# Patient Record
Sex: Male | Born: 1985 | Race: White | Hispanic: No | Marital: Married | Smoking: Current every day smoker
Health system: Southern US, Community
[De-identification: ages and names within clinical notes are randomized; demographics above are authoritative.]

## PROBLEM LIST (undated history)

## (undated) DIAGNOSIS — K589 Irritable bowel syndrome without diarrhea: Secondary | ICD-10-CM

## (undated) DIAGNOSIS — F1010 Alcohol abuse, uncomplicated: Secondary | ICD-10-CM

## (undated) DIAGNOSIS — K859 Acute pancreatitis without necrosis or infection, unspecified: Secondary | ICD-10-CM

## (undated) DIAGNOSIS — F419 Anxiety disorder, unspecified: Secondary | ICD-10-CM

## (undated) DIAGNOSIS — F329 Major depressive disorder, single episode, unspecified: Secondary | ICD-10-CM

## (undated) DIAGNOSIS — F32A Depression, unspecified: Secondary | ICD-10-CM

## (undated) HISTORY — PX: CHOLECYSTECTOMY: SHX55

---

## 2018-07-16 ENCOUNTER — Emergency Department (HOSPITAL_COMMUNITY)
Admission: EM | Admit: 2018-07-16 | Discharge: 2018-07-16 | Disposition: A | Discharge status: Home / Self Care | Payer: Medicaid Other | Attending: Emergency Medicine | Admitting: Emergency Medicine

## 2018-07-16 ENCOUNTER — Encounter (HOSPITAL_COMMUNITY): Payer: Self-pay | Admitting: *Deleted

## 2018-07-16 ENCOUNTER — Emergency Department (HOSPITAL_COMMUNITY): Payer: Medicaid Other

## 2018-07-16 DIAGNOSIS — Y929 Unspecified place or not applicable: Secondary | ICD-10-CM | POA: Diagnosis not present

## 2018-07-16 DIAGNOSIS — S01412A Laceration without foreign body of left cheek and temporomandibular area, initial encounter: Secondary | ICD-10-CM | POA: Insufficient documentation

## 2018-07-16 DIAGNOSIS — Y9389 Activity, other specified: Secondary | ICD-10-CM | POA: Insufficient documentation

## 2018-07-16 DIAGNOSIS — R51 Headache: Secondary | ICD-10-CM | POA: Insufficient documentation

## 2018-07-16 DIAGNOSIS — F17210 Nicotine dependence, cigarettes, uncomplicated: Secondary | ICD-10-CM | POA: Diagnosis not present

## 2018-07-16 DIAGNOSIS — Z23 Encounter for immunization: Secondary | ICD-10-CM | POA: Diagnosis not present

## 2018-07-16 DIAGNOSIS — Y999 Unspecified external cause status: Secondary | ICD-10-CM | POA: Diagnosis not present

## 2018-07-16 DIAGNOSIS — M7918 Myalgia, other site: Secondary | ICD-10-CM

## 2018-07-16 DIAGNOSIS — S0181XA Laceration without foreign body of other part of head, initial encounter: Secondary | ICD-10-CM

## 2018-07-16 MED ORDER — OXYCODONE-ACETAMINOPHEN 5-325 MG PO TABS
1.0000 | ORAL_TABLET | Freq: Once | ORAL | Status: AC
  Administered 2018-07-16: 1 via ORAL
  Filled 2018-07-16: qty 1

## 2018-07-16 MED ORDER — TETANUS-DIPHTH-ACELL PERTUSSIS 5-2.5-18.5 LF-MCG/0.5 IM SUSP
0.5000 mL | Freq: Once | INTRAMUSCULAR | Status: AC
  Administered 2018-07-16: 0.5 mL via INTRAMUSCULAR
  Filled 2018-07-16: qty 0.5

## 2018-07-16 MED ORDER — METHOCARBAMOL 500 MG PO TABS: 500.0000 mg | ORAL_TABLET | Freq: Two times a day (BID) | ORAL | 0 refills | Status: DC

## 2018-07-16 MED ORDER — LIDOCAINE-EPINEPHRINE 2 %-1:100000 IJ SOLN: 20.0000 mL | Freq: Once | INTRAMUSCULAR | Status: DC

## 2018-07-16 MED ORDER — LIDOCAINE-EPINEPHRINE (PF) 2 %-1:200000 IJ SOLN
20.0000 mL | Freq: Once | INTRAMUSCULAR | Status: AC
  Administered 2018-07-16: 20 mL
  Filled 2018-07-16: qty 20

## 2018-07-16 MED ORDER — TRAMADOL HCL 50 MG PO TABS: 50.0000 mg | ORAL_TABLET | Freq: Four times a day (QID) | ORAL | 0 refills | Status: DC | PRN
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
## 2018-07-16 NOTE — ED Notes (Signed)
Patient verbalizes understanding of discharge instructions. Opportunity for questioning and answers were provided. Armband removed by staff, pt discharged from ED.  
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
## 2018-07-16 NOTE — Discharge Instructions (Addendum)
Please read attached information. If you experience any new or worsening signs or symptoms please return to the emergency room for evaluation. Please follow-up with your primary care provider or specialist as discussed. Please use medication prescribed only as directed and discontinue taking if you have any concerning signs or symptoms.   °

## 2018-09-08 ENCOUNTER — Emergency Department (HOSPITAL_COMMUNITY)
Admission: EM | Admit: 2018-09-08 | Discharge: 2018-09-08 | Disposition: A | Discharge status: Home / Self Care | Payer: Medicaid Other | Attending: Emergency Medicine | Admitting: Emergency Medicine

## 2018-09-08 ENCOUNTER — Encounter (HOSPITAL_COMMUNITY): Payer: Self-pay

## 2018-09-08 ENCOUNTER — Other Ambulatory Visit: Payer: Self-pay

## 2018-09-08 ENCOUNTER — Emergency Department (HOSPITAL_COMMUNITY): Payer: Medicaid Other

## 2018-09-08 DIAGNOSIS — Z79899 Other long term (current) drug therapy: Secondary | ICD-10-CM | POA: Diagnosis not present

## 2018-09-08 DIAGNOSIS — R1084 Generalized abdominal pain: Secondary | ICD-10-CM | POA: Diagnosis present

## 2018-09-08 DIAGNOSIS — F17200 Nicotine dependence, unspecified, uncomplicated: Secondary | ICD-10-CM | POA: Insufficient documentation

## 2018-09-08 DIAGNOSIS — K529 Noninfective gastroenteritis and colitis, unspecified: Secondary | ICD-10-CM

## 2018-09-08 HISTORY — DX: Irritable bowel syndrome, unspecified: K58.9

## 2018-09-08 HISTORY — DX: Major depressive disorder, single episode, unspecified: F32.9

## 2018-09-08 HISTORY — DX: Anxiety disorder, unspecified: F41.9

## 2018-09-08 HISTORY — DX: Depression, unspecified: F32.A

## 2018-09-08 LAB — COMPREHENSIVE METABOLIC PANEL
ALK PHOS: 129 U/L — AB (ref 38–126)
ALT: 29 U/L (ref 0–44)
AST: 38 U/L (ref 15–41)
Albumin: 3.9 g/dL (ref 3.5–5.0)
Anion gap: 13 (ref 5–15)
BILIRUBIN TOTAL: 0.8 mg/dL (ref 0.3–1.2)
CALCIUM: 9 mg/dL (ref 8.9–10.3)
CO2: 21 mmol/L — ABNORMAL LOW (ref 22–32)
CREATININE: 0.79 mg/dL (ref 0.61–1.24)
Chloride: 97 mmol/L — ABNORMAL LOW (ref 98–111)
GFR calc Af Amer: >60 mL/min (ref >60)
Glucose, Bld: 99 mg/dL (ref 70–99)
Potassium: 3.9 mmol/L (ref 3.5–5.1)
Sodium: 131 mmol/L — ABNORMAL LOW (ref 135–145)
TOTAL PROTEIN: 7.1 g/dL (ref 6.5–8.1)

## 2018-09-08 LAB — CBC WITH DIFFERENTIAL/PLATELET
ABS IMMATURE GRANULOCYTES: 0.02 10*3/uL (ref 0.00–0.07)
BASOS PCT: 1 %
Basophils Absolute: 0.1 10*3/uL (ref 0.0–0.1)
Eosinophils Absolute: 0.3 10*3/uL (ref 0.0–0.5)
Eosinophils Relative: 3 %
HEMATOCRIT: 48.2 % (ref 39.0–52.0)
Hemoglobin: 15.6 g/dL (ref 13.0–17.0)
Immature Granulocytes: 0 %
LYMPHS ABS: 1.6 10*3/uL (ref 0.7–4.0)
Lymphocytes Relative: 19 %
MCH: 30.3 pg (ref 26.0–34.0)
MCHC: 32.4 g/dL (ref 30.0–36.0)
MCV: 93.6 fL (ref 80.0–100.0)
MONO ABS: 0.5 10*3/uL (ref 0.1–1.0)
MONOS PCT: 6 %
Neutro Abs: 5.9 10*3/uL (ref 1.7–7.7)
Neutrophils Relative %: 71 %
PLATELETS: 214 10*3/uL (ref 150–400)
RBC: 5.15 MIL/uL (ref 4.22–5.81)
RDW: 12.2 % (ref 11.5–15.5)
WBC: 8.3 10*3/uL (ref 4.0–10.5)
nRBC: 0 % (ref 0.0–0.2)

## 2018-09-08 LAB — URINALYSIS, ROUTINE W REFLEX MICROSCOPIC
Bacteria, UA: NONE SEEN
Bilirubin Urine: NEGATIVE
GLUCOSE, UA: NEGATIVE mg/dL
Ketones, ur: NEGATIVE mg/dL
Leukocytes, UA: NEGATIVE
Nitrite: NEGATIVE
PH: 5 (ref 5.0–8.0)
Protein, ur: NEGATIVE mg/dL
Specific Gravity, Urine: 1.008 (ref 1.005–1.030)

## 2018-09-08 LAB — LIPASE, BLOOD: LIPASE: 44 U/L (ref 11–51)

## 2018-09-08 MED ORDER — ONDANSETRON 4 MG PO TBDP: 4.0000 mg | ORAL_TABLET | Freq: Three times a day (TID) | ORAL | 0 refills | Status: DC | PRN

## 2018-09-08 MED ORDER — ONDANSETRON 4 MG PO TBDP
4.0000 mg | ORAL_TABLET | Freq: Once | ORAL | Status: AC
  Administered 2018-09-08: 4 mg via ORAL
  Filled 2018-09-08: qty 1

## 2018-09-08 MED ORDER — DICYCLOMINE HCL 20 MG PO TABS: 20.0000 mg | ORAL_TABLET | Freq: Two times a day (BID) | ORAL | 0 refills | Status: DC

## 2018-09-08 MED ORDER — OXYCODONE-ACETAMINOPHEN 5-325 MG PO TABS
1.0000 | ORAL_TABLET | Freq: Once | ORAL | Status: AC
  Administered 2018-09-08: 1 via ORAL
  Filled 2018-09-08: qty 1

## 2018-09-08 MED ORDER — IOHEXOL 300 MG/ML  SOLN
100.0000 mL | Freq: Once | INTRAMUSCULAR | Status: AC | PRN
  Administered 2018-09-08: 100 mL via INTRAVENOUS

## 2018-09-08 MED ORDER — FENTANYL CITRATE (PF) 100 MCG/2ML IJ SOLN
50.0000 ug | Freq: Once | INTRAMUSCULAR | Status: AC
  Administered 2018-09-08: 50 ug via INTRAVENOUS
  Filled 2018-09-08: qty 2

## 2018-09-08 MED ORDER — ONDANSETRON HCL 4 MG/2ML IJ SOLN
4.0000 mg | Freq: Once | INTRAMUSCULAR | Status: AC
  Administered 2018-09-08: 4 mg via INTRAVENOUS
  Filled 2018-09-08: qty 2

## 2018-09-08 NOTE — ED Provider Notes (Signed)
[facility] EMERGENCY DEPARTMENT Provider Note   CSN: [id number] Arrival date & time: 09/08/18  1816     History   Chief Complaint Chief Complaint  Patient presents with  . Abdominal Pain    HPI Austin Jenkins is a 32 y.o. male.  32 year old male with past medical history of irritable bowel syndrome, previous cholecystectomy presents with complaint of upper abdominal pain.  Patient reports stabbing upper abdominal pain onset about 1 hour prior to arrival in the ER while watching his son play, pain is constant.  Pain is worse with vomiting, reports 3 episodes of emesis prior to arrival.  Nothing makes his pain better, pain does not radiate.  Patient reports feeling constipated, last bowel movement was earlier today, states that he had to strain for this bowel movement, denies blood in his stools.  Denies changes in bladder habits, fevers, chills.  Patient states last episode similar to this abdominal pain episode was in July while he was in  and he went to the emergency room.  Patient states this is similar to his previous episodes of irritable bowel syndrome.  No other complaints or concerns.     Past Medical History:  Diagnosis Date  . Anxiety   . Depression   . IBS (irritable bowel syndrome)     There are no active problems to display for this patient.   Past Surgical History:  Procedure Laterality Date  . CHOLECYSTECTOMY          Home Medications    Prior to Admission medications   Medication Sig Start Date End Date Taking? Authorizing Provider  dicyclomine (BENTYL) 20 MG tablet Take 1 tablet (20 mg total) by mouth 2 (two) times daily. 09/08/18   Jeannie Fend, PA-C  methocarbamol (ROBAXIN) 500 MG tablet Take 1 tablet (500 mg total) by mouth 2 (two) times daily. 07/16/18   Hedges, Tinnie Gens, PA-C  ondansetron (ZOFRAN ODT) 4 MG disintegrating tablet Take 1 tablet (4 mg total) by mouth every 8 (eight) hours as needed for nausea or  vomiting. 09/08/18   Jeannie Fend, PA-C  traMADol (ULTRAM) 50 MG tablet Take 1 tablet (50 mg total) by mouth every 6 (six) hours as needed. 07/16/18   Eyvonne Mechanic, PA-C    Family History No family history on file.  Social History Social History   Tobacco Use  . Smoking status: Current Every Day Smoker    Packs/day: 0.50  . Smokeless tobacco: Current User  Substance Use Topics  . Alcohol use: Yes    Alcohol/week: 5.0 standard drinks    Types: 5 Cans of beer per week  . Drug use: Not on file     Allergies   Patient has no known allergies.   Review of Systems Review of Systems  Constitutional: Negative for chills and fever.  Respiratory: Negative for shortness of breath.   Cardiovascular: Negative for chest pain.  Gastrointestinal: Positive for abdominal pain, constipation, nausea and vomiting. Negative for abdominal distention, blood in stool and diarrhea.  Genitourinary: Negative for difficulty urinating, dysuria, frequency and hematuria.  Musculoskeletal: Negative for arthralgias and myalgias.  Skin: Negative for rash.  Allergic/Immunologic: Negative for immunocompromised state.  Neurological: Negative for weakness.  Hematological: Does not bruise/bleed easily.  All other systems reviewed and are negative.    Physical Exam Updated Vital Signs BP 121/85   Pulse 98   Temp 97.6 F (36.4 C) (Oral)   Resp 20   SpO2 99%   Physical Exam  Constitutional:  He is oriented to person, place, and time. He appears well-developed and well-nourished. No distress.  Appears to be in pain, holding his upper abdominal area and grimacing.   HENT:  Head: Normocephalic and atraumatic.  Cardiovascular: Normal rate, regular rhythm, normal heart sounds and intact distal pulses.  No murmur heard. Pulmonary/Chest: Effort normal and breath sounds normal.  Abdominal: Normal appearance. There is generalized tenderness. There is no CVA tenderness.  Neurological: He is alert and  oriented to person, place, and time.  Skin: Skin is warm and dry. He is not diaphoretic.  Psychiatric: He has a normal mood and affect. His behavior is normal.  Nursing note and vitals reviewed.    ED Treatments / Results  Labs (all labs ordered are listed, but only abnormal results are displayed) Labs Reviewed  COMPREHENSIVE METABOLIC PANEL - Abnormal; Notable for the following components:      Result Value   Sodium 131 (*)    Chloride 97 (*)    CO2 21 (*)    BUN <5 (*)    Alkaline Phosphatase 129 (*)    All other components within normal limits  URINALYSIS, ROUTINE W REFLEX MICROSCOPIC - Abnormal; Notable for the following components:   Hgb urine dipstick SMALL (*)    All other components within normal limits  CBC WITH DIFFERENTIAL/PLATELET  LIPASE, BLOOD    EKG None  Radiology Ct Abdomen Pelvis W Contrast  Result Date: 09/08/2018 CLINICAL DATA:  Upper abdominal pain with nausea and vomiting. EXAM: CT ABDOMEN AND PELVIS WITH CONTRAST TECHNIQUE: Multidetector CT imaging of the abdomen and pelvis was performed using the standard protocol following bolus administration of intravenous contrast. CONTRAST:  100mL OMNIPAQUE IOHEXOL 300 MG/ML  SOLN COMPARISON:  None. FINDINGS: Lower chest: Unremarkable. Hepatobiliary: 4 mm hypodensity in the lateral segment left liver is too small to characterize but most likely benign and probably a cyst. Gallbladder is surgically absent. No intrahepatic or extrahepatic biliary dilation. Pancreas: No focal mass lesion. No dilatation of the main duct. No intraparenchymal cyst. No peripancreatic edema. Spleen: No splenomegaly. No focal mass lesion. Adrenals/Urinary Tract: No adrenal nodule or mass. Kidneys unremarkable. No evidence for hydroureter. Bladder is distended. There is some trace high attenuation material in the dependent bladder lumen but both kidney show some early excretion of contrast and this is probably contrast in the urinary bladder.  Stomach/Bowel: Stomach is nondistended. No gastric wall thickening. No evidence of outlet obstruction. Duodenum is normally positioned as is the ligament of Treitz. No small bowel wall thickening. No small bowel dilatation. The terminal ileum is normal. The appendix is normal. Areas of focal wall thickening in the ascending colon and hepatic flexure noted. There appears to be subtle edema in the adjacent pericolonic fat (34/3). Left colon unremarkable. Vascular/Lymphatic: There is abdominal aortic atherosclerosis without aneurysm. There is no gastrohepatic or hepatoduodenal ligament lymphadenopathy. No intraperitoneal or retroperitoneal lymphadenopathy. No pelvic sidewall lymphadenopathy. Reproductive: The prostate gland and seminal vesicles have normal imaging features. Other: No intraperitoneal free fluid. Musculoskeletal: No worrisome lytic or sclerotic osseous abnormality. IMPRESSION: 1. The wall of the colon in the region of the hepatic flexure is circumferentially thickened to a mild degree and there appears to be some very subtle pericolonic edema. Infectious/inflammatory colitis in the region of the hepatic flexure would be a consideration. 2. Otherwise unremarkable CT scan of the abdomen and pelvis. Electronically Signed   By: Kennith CenterEric  Mansell M.D.   On: 09/08/2018 20:44    Procedures Procedures (including critical care time)  Medications Ordered in ED Medications  oxyCODONE-acetaminophen (PERCOCET/ROXICET) 5-325 MG per tablet 1 tablet (has no administration in time range)  ondansetron (ZOFRAN-ODT) disintegrating tablet 4 mg (has no administration in time range)  fentaNYL (SUBLIMAZE) injection 50 mcg (50 mcg Intravenous Given 09/08/18 1839)  ondansetron (ZOFRAN) injection 4 mg (4 mg Intravenous Given 09/08/18 1839)  iohexol (OMNIPAQUE) 300 MG/ML solution 100 mL (100 mLs Intravenous Contrast Given 09/08/18 2006)     Initial Impression / Assessment and Plan / ED Course  I have reviewed the triage  vital signs and the nursing notes.  Pertinent labs & imaging results that were available during my care of the patient were reviewed by me and considered in my medical decision making (see chart for details).  Clinical Course as of Sep 08 2100  Sat Sep 08, 2018  [id number] 32 year old male with history of IBS presents with complaint of "IBS flare."  Symptoms started 1 hour prior to arrival in the ER associated with nausea and vomiting.  On exam patient had generalized abdominal tenderness.  Patient was given fentanyl with relief of his pain.  Fentanyl has worn off at time for discharge planning and patient has return of his abdominal pain, given Percocet prior to discharge.  Analysis shows small amount of hemoglobin, lipase is normal, CMP without significant changes, CBC within normal limits.  CT scan abdomen pelvis with contrast shows possible hepatic flexure colitis.  Discussed results with patient, referral to GI for follow-up, given prescription for Zofran and Bentyl.  Patient to return to ER for fever or worsening pain otherwise contact GI Monday for follow-up appointment.   [LM]    Clinical Course User Index [LM] Jeannie Fend, PA-C   Final Clinical Impressions(s) / ED Diagnoses   Final diagnoses:  Generalized abdominal pain  Colitis    ED Discharge Orders         Ordered    ondansetron (ZOFRAN ODT) 4 MG disintegrating tablet  Every 8 hours PRN     09/08/18 2052    dicyclomine (BENTYL) 20 MG tablet  2 times daily     09/08/18 2052           Jeannie Fend, PA-C 09/08/18 2101    Eber Hong, MD 09/09/18 [phone]

## 2018-09-08 NOTE — ED Notes (Signed)
Patient transported to CT 

## 2018-09-08 NOTE — Discharge Instructions (Addendum)
Bentyl and Zofran as prescribed. Follow-up with GI, referral given.

## 2018-09-08 NOTE — ED Notes (Signed)
Patient verbalizes understanding of discharge instructions. Opportunity for questioning and answers were provided. Armband removed by staff, pt discharged from ED.  

## 2018-09-08 NOTE — ED Triage Notes (Signed)
Pt presents to ED for evaluation of sudden onset of abdominal pain and vomiting about an hour ago. Pt states he was playing with his son when it started. Pt states he was diagnosed with IBS several years ago. Pt guarding abdomen on assessment.

## 2018-10-22 ENCOUNTER — Emergency Department (HOSPITAL_COMMUNITY)
Admission: EM | Admit: 2018-10-22 | Discharge: 2018-10-23 | Disposition: A | Discharge status: Home / Self Care | Payer: Medicaid Other | Attending: Emergency Medicine | Admitting: Emergency Medicine

## 2018-10-22 ENCOUNTER — Encounter (HOSPITAL_COMMUNITY): Payer: Self-pay | Admitting: *Deleted

## 2018-10-22 ENCOUNTER — Other Ambulatory Visit: Payer: Self-pay

## 2018-10-22 DIAGNOSIS — F17210 Nicotine dependence, cigarettes, uncomplicated: Secondary | ICD-10-CM | POA: Insufficient documentation

## 2018-10-22 DIAGNOSIS — K852 Alcohol induced acute pancreatitis without necrosis or infection: Secondary | ICD-10-CM | POA: Diagnosis not present

## 2018-10-22 DIAGNOSIS — Z79899 Other long term (current) drug therapy: Secondary | ICD-10-CM | POA: Insufficient documentation

## 2018-10-22 DIAGNOSIS — R1010 Upper abdominal pain, unspecified: Secondary | ICD-10-CM | POA: Diagnosis present

## 2018-10-22 LAB — COMPREHENSIVE METABOLIC PANEL
ALT: 61 U/L — ABNORMAL HIGH (ref 0–44)
AST: 89 U/L — AB (ref 15–41)
Albumin: 3.8 g/dL (ref 3.5–5.0)
Alkaline Phosphatase: 125 U/L (ref 38–126)
Anion gap: 12 (ref 5–15)
BILIRUBIN TOTAL: 0.6 mg/dL (ref 0.3–1.2)
BUN: <5 mg/dL — ABNORMAL LOW (ref 6–20)
CO2: 22 mmol/L (ref 22–32)
CREATININE: 0.99 mg/dL (ref 0.61–1.24)
Calcium: 8.7 mg/dL — ABNORMAL LOW (ref 8.9–10.3)
Chloride: 104 mmol/L (ref 98–111)
Glucose, Bld: 125 mg/dL — ABNORMAL HIGH (ref 70–99)
POTASSIUM: 3.9 mmol/L (ref 3.5–5.1)
Sodium: 138 mmol/L (ref 135–145)
TOTAL PROTEIN: 6.8 g/dL (ref 6.5–8.1)

## 2018-10-22 LAB — URINALYSIS, ROUTINE W REFLEX MICROSCOPIC
Bacteria, UA: NONE SEEN
Bilirubin Urine: NEGATIVE
GLUCOSE, UA: NEGATIVE mg/dL
Ketones, ur: NEGATIVE mg/dL
Leukocytes, UA: NEGATIVE
NITRITE: NEGATIVE
Protein, ur: NEGATIVE mg/dL
SPECIFIC GRAVITY, URINE: 1.016 (ref 1.005–1.030)
pH: 5 (ref 5.0–8.0)

## 2018-10-22 LAB — CBC
HCT: 47.4 % (ref 39.0–52.0)
HEMOGLOBIN: 16.1 g/dL (ref 13.0–17.0)
MCH: 32.3 pg (ref 26.0–34.0)
MCHC: 34 g/dL (ref 30.0–36.0)
MCV: 95 fL (ref 80.0–100.0)
NRBC: 0 % (ref 0.0–0.2)
Platelets: 228 10*3/uL (ref 150–400)
RBC: 4.99 MIL/uL (ref 4.22–5.81)
RDW: 12.2 % (ref 11.5–15.5)
WBC: 5.5 10*3/uL (ref 4.0–10.5)

## 2018-10-22 LAB — LIPASE, BLOOD: Lipase: 352 U/L — ABNORMAL HIGH (ref 11–51)

## 2018-10-22 MED ORDER — SODIUM CHLORIDE 0.9 % IV BOLUS
1000.0000 mL | Freq: Once | INTRAVENOUS | Status: AC
  Administered 2018-10-22: 1000 mL via INTRAVENOUS

## 2018-10-22 MED ORDER — HYDROMORPHONE HCL 1 MG/ML IJ SOLN
0.5000 mg | Freq: Once | INTRAMUSCULAR | Status: AC
  Administered 2018-10-22: 0.5 mg via INTRAVENOUS
  Filled 2018-10-22: qty 1

## 2018-10-22 MED ORDER — SODIUM CHLORIDE 0.9% FLUSH
3.0000 mL | Freq: Once | INTRAVENOUS | Status: AC
  Administered 2018-10-22: 3 mL via INTRAVENOUS

## 2018-10-22 MED ORDER — OXYCODONE HCL 5 MG PO TABS
5.0000 mg | ORAL_TABLET | Freq: Once | ORAL | Status: AC
  Administered 2018-10-23: 5 mg via ORAL
  Filled 2018-10-22: qty 1

## 2018-10-22 MED ORDER — ONDANSETRON HCL 4 MG/2ML IJ SOLN
4.0000 mg | Freq: Once | INTRAMUSCULAR | Status: AC
  Administered 2018-10-22: 4 mg via INTRAVENOUS
  Filled 2018-10-22: qty 2

## 2018-10-22 NOTE — ED Provider Notes (Signed)
[facility] Provider Note   CSN: [id number] Arrival date & time: 10/22/18  1728     History   Chief Complaint Chief Complaint  Patient presents with  . Abdominal Pain    HPI Austin MinisterMichael Smylie is a 33 y.o. male.  Patient with history of IBS, previous cholecystectomy presents the emergency department with acute onset of upper abdominal pain bilaterally without radiation approximately 2 hours ago.  Patient has had an episode of vomiting with nausea upon arrival to the emergency department.  No diarrhea.  No chest pain or shortness of breath.  No treatments prior to arrival.  Patient had a similar episode of pain in 09/2018.  At that time he had a CT scan showing mild colitis.  No urinary symptoms.  Patient denies heavy NSAID use.  He does drink alcohol occasionally, admits to 4 beers last night.     Past Medical History:  Diagnosis Date  . Anxiety   . Depression   . IBS (irritable bowel syndrome)     There are no active problems to display for this patient.   Past Surgical History:  Procedure Laterality Date  . CHOLECYSTECTOMY          Home Medications    Prior to Admission medications   Medication Sig Start Date End Date Taking? Authorizing Provider  dicyclomine (BENTYL) 20 MG tablet Take 1 tablet (20 mg total) by mouth 2 (two) times daily. 09/08/18   Jeannie FendMurphy, Laura A, PA-C  methocarbamol (ROBAXIN) 500 MG tablet Take 1 tablet (500 mg total) by mouth 2 (two) times daily. 07/16/18   Hedges, Tinnie GensJeffrey, PA-C  ondansetron (ZOFRAN ODT) 4 MG disintegrating tablet Take 1 tablet (4 mg total) by mouth every 8 (eight) hours as needed for nausea or vomiting. 09/08/18   Jeannie FendMurphy, Laura A, PA-C  traMADol (ULTRAM) 50 MG tablet Take 1 tablet (50 mg total) by mouth every 6 (six) hours as needed. 07/16/18   Eyvonne MechanicHedges, Jeffrey, PA-C    Family History History reviewed. No pertinent family history.  Social History Social History   Tobacco Use  . Smoking  status: Current Every Day Smoker    Packs/day: 0.50  . Smokeless tobacco: Current User  Substance Use Topics  . Alcohol use: Yes    Alcohol/week: 5.0 standard drinks    Types: 5 Cans of beer per week  . Drug use: Not on file     Allergies   Patient has no known allergies.   Review of Systems Review of Systems  Constitutional: Negative for fever.  HENT: Negative for rhinorrhea and sore throat.   Eyes: Negative for redness.  Respiratory: Negative for cough and shortness of breath.   Cardiovascular: Negative for chest pain.  Gastrointestinal: Positive for abdominal pain and nausea. Negative for blood in stool, constipation, diarrhea and vomiting.  Genitourinary: Negative for dysuria.  Musculoskeletal: Negative for myalgias.  Skin: Negative for rash.  Neurological: Negative for headaches.     Physical Exam Updated Vital Signs BP (!) 130/95 (BP Location: Right Arm)   Pulse 71   Resp 16   Ht 5\' 10"  (1.778 m)   Wt 71.7 kg   SpO2 99%   BMI 22.67 kg/m   Physical Exam Vitals signs and nursing note reviewed.  Constitutional:      General: He is in acute distress (Appears uncomfortable).     Appearance: He is well-developed.  HENT:     Head: Normocephalic and atraumatic.  Eyes:     General:  Right eye: No discharge.        Left eye: No discharge.     Conjunctiva/sclera: Conjunctivae normal.  Neck:     Musculoskeletal: Normal range of motion and neck supple.  Cardiovascular:     Rate and Rhythm: Normal rate and regular rhythm.     Heart sounds: Normal heart sounds.  Pulmonary:     Effort: Pulmonary effort is normal.     Breath sounds: Normal breath sounds.  Abdominal:     Palpations: Abdomen is soft.     Tenderness: There is abdominal tenderness in the right upper quadrant, epigastric area and left upper quadrant. There is no guarding or rebound. Negative signs include Murphy's sign, Rovsing's sign and McBurney's sign.  Skin:    General: Skin is warm and dry.   Neurological:     Mental Status: He is alert.      ED Treatments / Results  Labs (all labs ordered are listed, but only abnormal results are displayed) Labs Reviewed  LIPASE, BLOOD - Abnormal; Notable for the following components:      Result Value   Lipase 352 (*)    All other components within normal limits  COMPREHENSIVE METABOLIC PANEL - Abnormal; Notable for the following components:   Glucose, Bld 125 (*)    BUN <5 (*)    Calcium 8.7 (*)    AST 89 (*)    ALT 61 (*)    All other components within normal limits  URINALYSIS, ROUTINE W REFLEX MICROSCOPIC - Abnormal; Notable for the following components:   Hgb urine dipstick SMALL (*)    All other components within normal limits  CBC    EKG None  Radiology No results found.  Procedures Procedures (including critical care time)  Medications Ordered in ED Medications  oxyCODONE (Oxy IR/ROXICODONE) immediate release tablet 5 mg (has no administration in time range)  sodium chloride flush (NS) 0.9 % injection 3 mL (3 mLs Intravenous Given 10/22/18 2334)  sodium chloride 0.9 % bolus 1,000 mL (0 mLs Intravenous Stopped 10/22/18 2105)  HYDROmorphone (DILAUDID) injection 0.5 mg (0.5 mg Intravenous Given 10/22/18 1841)  ondansetron (ZOFRAN) injection 4 mg (4 mg Intravenous Given 10/22/18 1842)  HYDROmorphone (DILAUDID) injection 0.5 mg (0.5 mg Intravenous Given 10/22/18 2103)  sodium chloride 0.9 % bolus 1,000 mL (0 mLs Intravenous Stopped 10/22/18 2330)  HYDROmorphone (DILAUDID) injection 0.5 mg (0.5 mg Intravenous Given 10/22/18 2333)     Initial Impression / Assessment and Plan / ED Course  I have reviewed the triage vital signs and the nursing notes.  Pertinent labs & imaging results that were available during my care of the patient were reviewed by me and considered in my medical decision making (see chart for details).     Patient seen and examined. Work-up initiated. Medications ordered.   Vital signs reviewed and  are as follows: BP (!) 130/95 (BP Location: Right Arm)   Pulse 71   Resp 16   Ht 5\' 10"  (1.778 m)   Wt 71.7 kg   SpO2 99%   BMI 22.67 kg/m   6:34 PM labs reviewed.  Patient has acute pancreatitis likely secondary to alcohol use.  Elevated lipase and transaminases noted.  Will continue symptom control.  Patient updated.   10:48 PM Patient was given 2nd dose of pain medicine earlier. Rechecked again and still appears uncomfortable.  I have ordered third dose of pain medication.  I discussed admission with the patient.  He is reluctant to stay, however I  voiced that I have low confidence that we get his pain controlled enough for him to go home at this point.  Will reassess.   11:40 PM rechecked patient.  This was prior to him receiving his third dose of pain medication.  I discussed that I reservations about him going home and given his current pain level, do not feel that he will do well.  He is adamant about going home.  His family is at bedside and involved in the decision.  Awaiting third dose of Dilaudid.  We will also give oral medication.  If he does not want to stay in the hospital, encouraged clear liquid diet until symptoms are much improved.  Will give oral pain and nausea medication.  We discussed that if he goes home his symptoms may get worse, that he may become very dehydrated, and get much sicker.  Discussed that if he changes his mind, he may return to the hospital at any time, especially if symptoms are uncontrolled and he is not doing well.  Will reassess prior to discharging.  12:00 AM Browning PA-C to reassess after oral pain meds. If patient adamant about going home, will give meds to control sx.   Final Clinical Impressions(s) / ED Diagnoses   Final diagnoses:  Alcohol-induced acute pancreatitis, unspecified complication status   Abdominal pain/vomiting c/w alcohol induced pancreatitis.  Symptoms are poorly controlled however patient has been requesting discharge to  home.  ED Discharge Orders    None       Renne Crigler, PA-C 10/23/18 0001    Wynetta Fines, MD 10/23/18 [phone]

## 2018-10-22 NOTE — ED Triage Notes (Signed)
Pt in c/o upper abdominal pain that started earlier today, history of IBS with similar pain, reports nausea but denies vomiting

## 2018-10-23 MED ORDER — ONDANSETRON 4 MG PO TBDP: 4.0000 mg | ORAL_TABLET | Freq: Three times a day (TID) | ORAL | 0 refills | Status: DC | PRN

## 2018-10-23 MED ORDER — HYDROCODONE-ACETAMINOPHEN 5-325 MG PO TABS: 1.0000 | ORAL_TABLET | Freq: Four times a day (QID) | ORAL | 0 refills | Status: DC | PRN

## 2018-10-23 NOTE — ED Provider Notes (Signed)
Patient with epigastric pain and vomiting.  Elevated lipase. Thought to be alcohol induced pancreatitis.  Patient feeling improved.  Requesting discharge.  He was advised that it would likely be better to be observed in the hospital.  He was offered admission several times and this was declined each time by the patient.  He is encouraged to return at anytime or for new or worsening symptoms.     Roxy Horseman, PA-C 10/23/18 0033    Wynetta Fines, MD 10/23/18 [phone]

## 2018-10-24 ENCOUNTER — Emergency Department (HOSPITAL_COMMUNITY): Payer: Medicaid Other

## 2018-10-24 ENCOUNTER — Other Ambulatory Visit: Payer: Self-pay

## 2018-10-24 ENCOUNTER — Observation Stay (HOSPITAL_COMMUNITY)
Admission: EM | Admit: 2018-10-24 | Discharge: 2018-10-25 | Discharge status: Against Medical Advice | Payer: Medicaid Other | Attending: Internal Medicine | Admitting: Internal Medicine

## 2018-10-24 ENCOUNTER — Encounter (HOSPITAL_COMMUNITY): Payer: Self-pay | Admitting: Emergency Medicine

## 2018-10-24 DIAGNOSIS — F10988 Alcohol use, unspecified with other alcohol-induced disorder: Secondary | ICD-10-CM | POA: Insufficient documentation

## 2018-10-24 DIAGNOSIS — F419 Anxiety disorder, unspecified: Secondary | ICD-10-CM | POA: Diagnosis not present

## 2018-10-24 DIAGNOSIS — Z9049 Acquired absence of other specified parts of digestive tract: Secondary | ICD-10-CM | POA: Insufficient documentation

## 2018-10-24 DIAGNOSIS — F17210 Nicotine dependence, cigarettes, uncomplicated: Secondary | ICD-10-CM | POA: Diagnosis not present

## 2018-10-24 DIAGNOSIS — K852 Alcohol induced acute pancreatitis without necrosis or infection: Secondary | ICD-10-CM | POA: Diagnosis not present

## 2018-10-24 DIAGNOSIS — Z79899 Other long term (current) drug therapy: Secondary | ICD-10-CM | POA: Diagnosis not present

## 2018-10-24 DIAGNOSIS — K589 Irritable bowel syndrome without diarrhea: Secondary | ICD-10-CM | POA: Diagnosis not present

## 2018-10-24 DIAGNOSIS — Z5329 Procedure and treatment not carried out because of patient's decision for other reasons: Secondary | ICD-10-CM | POA: Insufficient documentation

## 2018-10-24 DIAGNOSIS — F329 Major depressive disorder, single episode, unspecified: Secondary | ICD-10-CM | POA: Insufficient documentation

## 2018-10-24 HISTORY — DX: Alcohol abuse, uncomplicated: F10.10

## 2018-10-24 HISTORY — DX: Acute pancreatitis without necrosis or infection, unspecified: K85.90

## 2018-10-24 LAB — COMPREHENSIVE METABOLIC PANEL
ALK PHOS: 105 U/L (ref 38–126)
ALT: 71 U/L — ABNORMAL HIGH (ref 0–44)
AST: 59 U/L — ABNORMAL HIGH (ref 15–41)
Albumin: 3 g/dL — ABNORMAL LOW (ref 3.5–5.0)
Anion gap: 12 (ref 5–15)
BUN: <5 mg/dL — ABNORMAL LOW (ref 6–20)
CO2: 24 mmol/L (ref 22–32)
Calcium: 8.8 mg/dL — ABNORMAL LOW (ref 8.9–10.3)
Chloride: 95 mmol/L — ABNORMAL LOW (ref 98–111)
Creatinine, Ser: 1.08 mg/dL (ref 0.61–1.24)
GFR calc Af Amer: >60 mL/min (ref >60)
GFR calc non Af Amer: >60 mL/min (ref >60)
Glucose, Bld: 118 mg/dL — ABNORMAL HIGH (ref 70–99)
Potassium: 4.5 mmol/L (ref 3.5–5.1)
SODIUM: 131 mmol/L — AB (ref 135–145)
Total Bilirubin: 1.4 mg/dL — ABNORMAL HIGH (ref 0.3–1.2)
Total Protein: 5.9 g/dL — ABNORMAL LOW (ref 6.5–8.1)

## 2018-10-24 LAB — URINALYSIS, ROUTINE W REFLEX MICROSCOPIC
Bilirubin Urine: NEGATIVE
Glucose, UA: NEGATIVE mg/dL
Ketones, ur: NEGATIVE mg/dL
Leukocytes, UA: NEGATIVE
Nitrite: NEGATIVE
Protein, ur: 100 mg/dL — AB
Specific Gravity, Urine: 1.021 (ref 1.005–1.030)
pH: 5 (ref 5.0–8.0)

## 2018-10-24 LAB — CBC
HCT: 49.8 % (ref 39.0–52.0)
Hemoglobin: 17.2 g/dL — ABNORMAL HIGH (ref 13.0–17.0)
MCH: 32.8 pg (ref 26.0–34.0)
MCHC: 34.5 g/dL (ref 30.0–36.0)
MCV: 95 fL (ref 80.0–100.0)
PLATELETS: 165 10*3/uL (ref 150–400)
RBC: 5.24 MIL/uL (ref 4.22–5.81)
RDW: 12.4 % (ref 11.5–15.5)
WBC: 11.6 10*3/uL — ABNORMAL HIGH (ref 4.0–10.5)
nRBC: 0 % (ref 0.0–0.2)

## 2018-10-24 LAB — LIPASE, BLOOD: Lipase: 189 U/L — ABNORMAL HIGH (ref 11–51)

## 2018-10-24 MED ORDER — LACTATED RINGERS IV BOLUS
1000.0000 mL | Freq: Once | INTRAVENOUS | Status: AC
  Administered 2018-10-24: 1000 mL via INTRAVENOUS

## 2018-10-24 MED ORDER — LORAZEPAM 1 MG PO TABS: 1.0000 mg | ORAL_TABLET | Freq: Four times a day (QID) | ORAL | Status: DC | PRN

## 2018-10-24 MED ORDER — ONDANSETRON HCL 4 MG PO TABS: 4.0000 mg | ORAL_TABLET | Freq: Four times a day (QID) | ORAL | Status: DC | PRN

## 2018-10-24 MED ORDER — HYDROMORPHONE HCL 1 MG/ML IJ SOLN
1.0000 mg | Freq: Once | INTRAMUSCULAR | Status: AC
  Administered 2018-10-24: 1 mg via INTRAVENOUS
  Filled 2018-10-24: qty 1

## 2018-10-24 MED ORDER — HYDROMORPHONE HCL 1 MG/ML IJ SOLN
1.0000 mg | INTRAMUSCULAR | Status: DC | PRN
  Administered 2018-10-25: 1 mg via INTRAVENOUS
  Filled 2018-10-24: qty 1

## 2018-10-24 MED ORDER — FOLIC ACID 1 MG PO TABS: 1.0000 mg | ORAL_TABLET | Freq: Every day | ORAL | Status: DC

## 2018-10-24 MED ORDER — ENOXAPARIN SODIUM 40 MG/0.4ML ~~LOC~~ SOLN: 40.0000 mg | SUBCUTANEOUS | Status: DC

## 2018-10-24 MED ORDER — ACETAMINOPHEN 650 MG RE SUPP: 650.0000 mg | Freq: Four times a day (QID) | RECTAL | Status: DC | PRN

## 2018-10-24 MED ORDER — ACETAMINOPHEN 325 MG PO TABS: 650.0000 mg | ORAL_TABLET | Freq: Four times a day (QID) | ORAL | Status: DC | PRN

## 2018-10-24 MED ORDER — VITAMIN B-1 100 MG PO TABS: 100.0000 mg | ORAL_TABLET | Freq: Every day | ORAL | Status: DC

## 2018-10-24 MED ORDER — SODIUM CHLORIDE 0.9 % IV SOLN
INTRAVENOUS | Status: DC
  Administered 2018-10-24: 21:00:00 via INTRAVENOUS

## 2018-10-24 MED ORDER — LORAZEPAM 2 MG/ML IJ SOLN
1.0000 mg | Freq: Four times a day (QID) | INTRAMUSCULAR | Status: DC | PRN
  Administered 2018-10-24: 1 mg via INTRAVENOUS
  Filled 2018-10-24: qty 1

## 2018-10-24 MED ORDER — ONDANSETRON HCL 4 MG/2ML IJ SOLN: 4.0000 mg | Freq: Four times a day (QID) | INTRAMUSCULAR | Status: DC | PRN

## 2018-10-24 MED ORDER — ONDANSETRON HCL 4 MG/2ML IJ SOLN
4.0000 mg | Freq: Once | INTRAMUSCULAR | Status: AC
  Administered 2018-10-24: 4 mg via INTRAVENOUS
  Filled 2018-10-24: qty 2

## 2018-10-24 MED ORDER — IOHEXOL 300 MG/ML  SOLN
100.0000 mL | Freq: Once | INTRAMUSCULAR | Status: AC | PRN
  Administered 2018-10-24: 100 mL via INTRAVENOUS

## 2018-10-24 MED ORDER — THIAMINE HCL 100 MG/ML IJ SOLN
100.0000 mg | Freq: Every day | INTRAMUSCULAR | Status: DC
  Administered 2018-10-24: 100 mg via INTRAVENOUS
  Filled 2018-10-24: qty 2

## 2018-10-24 MED ORDER — ADULT MULTIVITAMIN W/MINERALS CH: 1.0000 | ORAL_TABLET | Freq: Every day | ORAL | Status: DC

## 2018-10-24 MED ORDER — SODIUM CHLORIDE 0.9% FLUSH
3.0000 mL | Freq: Once | INTRAVENOUS | Status: AC
  Administered 2018-10-24: 3 mL via INTRAVENOUS

## 2018-10-24 NOTE — ED Provider Notes (Signed)
[facility] EMERGENCY DEPARTMENT Provider Note   CSN: [id number] Arrival date & time: 10/24/18  1544     History   Chief Complaint Chief Complaint  Patient presents with  . Abdominal Pain    HPI Austin Jenkins is a 33 y.o. male.  HPI  33 year old male presents with recurrent upper abdominal pain.  He was seen here 2 days ago and diagnosed with pancreatitis.  He states he was feeling better and wanted to go home.  He has been taking Vicodin but ran out this morning.  He states the pain is much worse now.  It is worse than when it originally started 2 days ago.  Has nausea without vomiting.  No diarrhea.  No abdominal distention or swelling.  Previously he has been drinking about 6 beers per day for several years.  He states he has not had any alcohol since he found out about the pancreatitis.  Past Medical History:  Diagnosis Date  . Anxiety   . Depression   . IBS (irritable bowel syndrome)   . Pancreatitis     There are no active problems to display for this patient.   Past Surgical History:  Procedure Laterality Date  . CHOLECYSTECTOMY          Home Medications    Prior to Admission medications   Medication Sig Start Date End Date Taking? Authorizing Provider  ALPRAZolam Prudy Feeler) 1 MG tablet Take 1 mg by mouth 2 (two) times daily as needed for anxiety.   Yes [provider]  HYDROcodone-acetaminophen (NORCO/VICODIN) 5-325 MG tablet Take 1-2 tablets by mouth every 6 (six) hours as needed. 10/23/18  Yes Roxy Horseman, PA-C  ondansetron (ZOFRAN ODT) 4 MG disintegrating tablet Take 1 tablet (4 mg total) by mouth every 8 (eight) hours as needed. 10/23/18  Yes Roxy Horseman, PA-C    Family History No family history on file.  Social History Social History   Tobacco Use  . Smoking status: Current Every Day Smoker    Packs/day: 0.50  . Smokeless tobacco: Current User  Substance Use Topics  . Alcohol use: Yes    Alcohol/week: 5.0  standard drinks    Types: 5 Cans of beer per week  . Drug use: Not on file     Allergies   Patient has no known allergies.   Review of Systems Review of Systems  Constitutional: Negative for fever.  Respiratory: Negative for cough and shortness of breath.   Cardiovascular: Negative for chest pain.  Gastrointestinal: Positive for abdominal pain and nausea. Negative for diarrhea and vomiting.  All other systems reviewed and are negative.    Physical Exam Updated Vital Signs BP 126/90 (BP Location: Right Arm)   Pulse (!) 106   Temp 97.8 F (36.6 C) (Oral)   Resp 18   SpO2 100%   Physical Exam Vitals signs and nursing note reviewed.  Constitutional:      Appearance: He is well-developed. He is not ill-appearing or diaphoretic.     Comments: Appears uncomfortable and in pain  HENT:     Head: Normocephalic and atraumatic.     Right Ear: External ear normal.     Left Ear: External ear normal.     Nose: Nose normal.  Eyes:     General:        Right eye: No discharge.        Left eye: No discharge.  Neck:     Musculoskeletal: Neck supple.  Cardiovascular:  Rate and Rhythm: Regular rhythm. Tachycardia present.     Heart sounds: Normal heart sounds.  Pulmonary:     Effort: Pulmonary effort is normal.     Breath sounds: Normal breath sounds.  Abdominal:     Palpations: Abdomen is soft.     Tenderness: There is generalized abdominal tenderness. There is guarding.  Skin:    General: Skin is warm and dry.  Neurological:     Mental Status: He is alert.  Psychiatric:        Mood and Affect: Mood is not anxious.      ED Treatments / Results  Labs (all labs ordered are listed, but only abnormal results are displayed) Labs Reviewed  LIPASE, BLOOD - Abnormal; Notable for the following components:      Result Value   Lipase 189 (*)    All other components within normal limits  COMPREHENSIVE METABOLIC PANEL - Abnormal; Notable for the following components:    Sodium 131 (*)    Chloride 95 (*)    Glucose, Bld 118 (*)    BUN <5 (*)    Calcium 8.8 (*)    Total Protein 5.9 (*)    Albumin 3.0 (*)    AST 59 (*)    ALT 71 (*)    Total Bilirubin 1.4 (*)    All other components within normal limits  CBC - Abnormal; Notable for the following components:   WBC 11.6 (*)    Hemoglobin 17.2 (*)    All other components within normal limits  URINALYSIS, ROUTINE W REFLEX MICROSCOPIC - Abnormal; Notable for the following components:   Color, Urine AMBER (*)    Hgb urine dipstick MODERATE (*)    Protein, ur 100 (*)    Bacteria, UA RARE (*)    All other components within normal limits    EKG None  Radiology Ct Abdomen Pelvis W Contrast  Result Date: 10/24/2018 CLINICAL DATA:  Persistent abdominal pain with nausea and bloating. Recently diagnosed with pancreatitis. EXAM: CT ABDOMEN AND PELVIS WITH CONTRAST TECHNIQUE: Multidetector CT imaging of the abdomen and pelvis was performed using the standard protocol following bolus administration of intravenous contrast. CONTRAST:  100mL OMNIPAQUE IOHEXOL 300 MG/ML  SOLN COMPARISON:  CT 09/08/2018. FINDINGS: Lower chest: New small left-greater-than-right pleural effusions with dependent atelectasis at both lung bases. Hepatobiliary: Stable tiny low-density lesion in the left hepatic lobe on image 20/3, probably a cyst. No suspicious hepatic findings. No significant biliary dilatation post cholecystectomy. Pancreas: New mild generalized enlargement of the pancreas with homogeneous enhancement. No evidence of pancreatic hemorrhage or necrosis. There is extensive surrounding inflammation and ill-defined fluid. No evidence of pancreatic ductal dilatation. Spleen: Normal in size without focal abnormality. Adrenals/Urinary Tract: Both adrenal glands appear normal. The kidneys appear normal without evidence of urinary tract calculus, suspicious lesion or hydronephrosis. No bladder abnormalities are seen. Stomach/Bowel: No  evidence of bowel wall thickening, distention or surrounding inflammatory change. The appendix appears normal. Vascular/Lymphatic: There are no enlarged abdominal or pelvic lymph nodes. There are small retroperitoneal lymph nodes which are likely reactive. Minimal aortic atherosclerosis. No acute vascular findings. Specifically, the portal, superior mesenteric and splenic veins are patent. Reproductive: The prostate gland and seminal vesicles appear normal. Other: New ascites, predominately around the liver, in the left pericolic gutter and the pelvis. No high-density components or focal extraluminal fluid collections identified. There is generalized retroperitoneal edema, especially around the pancreas. Musculoskeletal: No acute or significant osseous findings. IMPRESSION: 1. Progressive changes of acute  pancreatitis with enlargement of the gland, diffuse surrounding inflammation and ascites. No evidence of pancreatic hemorrhage or necrosis. 2. No biliary dilatation post cholecystectomy. 3. Small bilateral pleural effusions with bibasilar atelectasis. Electronically Signed   By: Carey Bullocks M.D.   On: 10/24/2018 18:52   Dg Chest Portable 1 View  Result Date: 10/24/2018 CLINICAL DATA:  Diffuse abdominal pain EXAM: PORTABLE CHEST 1 VIEW COMPARISON:  None. FINDINGS: Minor bandlike atelectasis versus scarring. Lungs otherwise clear. Normal heart size and vascularity. Negative for edema, effusion or pneumothorax. Trachea midline. IMPRESSION: Bibasilar atelectasis versus scarring. Electronically Signed   By: Judie Petit.  Shick M.D.   On: 10/24/2018 17:39    Procedures Procedures (including critical care time)  Medications Ordered in ED Medications  sodium chloride flush (NS) 0.9 % injection 3 mL (3 mLs Intravenous Given 10/24/18 1656)  lactated ringers bolus 1,000 mL (1,000 mLs Intravenous New Bag/Given 10/24/18 1733)  lactated ringers bolus 1,000 mL (1,000 mLs Intravenous New Bag/Given 10/24/18 1731)    HYDROmorphone (DILAUDID) injection 1 mg (1 mg Intravenous Given 10/24/18 1728)  ondansetron (ZOFRAN) injection 4 mg (4 mg Intravenous Given 10/24/18 1728)  iohexol (OMNIPAQUE) 300 MG/ML solution 100 mL (100 mLs Intravenous Contrast Given 10/24/18 1820)  HYDROmorphone (DILAUDID) injection 1 mg (1 mg Intravenous Given 10/24/18 1844)     Initial Impression / Assessment and Plan / ED Course  I have reviewed the triage vital signs and the nursing notes.  Pertinent labs & imaging results that were available during my care of the patient were reviewed by me and considered in my medical decision making (see chart for details).     Given his diffuse guarding and tenderness, CT obtained.  Does confirm acute pancreatitis and there is significant edema/inflammation on CT.  No obvious pseudocyst or infection.  He does have a SIRS response.  Given the uncontrolled pain in association with all of this I think he needs to be admitted for IV fluids and supportive care. Dr Julian Reil to admit.  Final Clinical Impressions(s) / ED Diagnoses   Final diagnoses:  Alcohol-induced acute pancreatitis without infection or necrosis    ED Discharge Orders    None       Pricilla Loveless, MD 10/24/18 [phone]

## 2018-10-24 NOTE — ED Notes (Signed)
Pt returned from CT °

## 2018-10-24 NOTE — ED Triage Notes (Signed)
Pt states he was seen for pancreatitis on 1/20 c/o continued abd pain and nausea, reports taking his last vicodin at 4am today.

## 2018-10-24 NOTE — H&P (Signed)
History and Physical    Austin Jenkins [id number] DOB: November 13, 1985 DOA: 10/24/2018  PCP: System, Pcp Not In  Patient coming from: Home  I have personally briefly reviewed patient's old medical records in [place]  Chief Complaint: Abd pain  HPI: Austin Jenkins is a 33 y.o. male with medical history significant of Cholecystectomy.  Seen in ED 2 days ago with epigastric abd pain, diagnosed with pancreatitis.  Was feeling better after ED treatment, went home.  Ran out of vicodin this AM, back into ED with ongoing abd pain.  Had been drinking 6 beers per day for several years until diagnosed with pancreatitis x2 days ago (none since).  Pain worse than when it originally started x2 days ago.  Nausea, no vomiting, no diarrhea.   ED Course: CT reveals acute pancreatitis with reactive ascites and peripancreatic edema, no infection nor necrosis.  Patient does have mild SIRS with WBC 11.6k, tachycardia to the 110s.   Review of Systems: As per HPI otherwise 10 point review of systems negative.   Past Medical History:  Diagnosis Date  . Anxiety   . Depression   . IBS (irritable bowel syndrome)   . Pancreatitis     Past Surgical History:  Procedure Laterality Date  . CHOLECYSTECTOMY       reports that he has been smoking. He has been smoking about 0.50 packs per day. He uses smokeless tobacco. He reports current alcohol use of about 5.0 standard drinks of alcohol per week. No history on file for drug.  No Known Allergies  No family history on file.   Prior to Admission medications   Medication Sig Start Date End Date Taking? Authorizing Provider  ALPRAZolam Austin Jenkins) 1 MG tablet Take 1 mg by mouth 2 (two) times daily as needed for anxiety.   Yes [provider]  HYDROcodone-acetaminophen (NORCO/VICODIN) 5-325 MG tablet Take 1-2 tablets by mouth every 6 (six) hours as needed. 10/23/18  Yes Austin Circle, PA-C  ondansetron (ZOFRAN ODT) 4 MG disintegrating  tablet Take 1 tablet (4 mg total) by mouth every 8 (eight) hours as needed. 10/23/18  Yes Austin Circle, PA-C    Physical Exam: Vitals:   10/24/18 1645 10/24/18 1730 10/24/18 1838 10/24/18 1929  BP: 112/89 109/84 126/90 107/84  Pulse: (!) 132 (!) 120 (!) 106 (!) 117  Resp: _0 Temp:      TempSrc:      SpO2: 100% 97% 100% 98%    Constitutional: NAD, calm, comfortable Eyes: PERRL, lids and conjunctivae normal ENMT: Mucous membranes are moist. Posterior pharynx clear of any exudate or lesions.Normal dentition.  Neck: normal, supple, no masses, no thyromegaly Respiratory: clear to auscultation bilaterally, no wheezing, no crackles. Normal respiratory effort. No accessory muscle use.  Cardiovascular: Regular rate and rhythm, no murmurs / rubs / gallops. No extremity edema. 2+ pedal pulses. No carotid bruits.  Abdomen: Epigastric TTP. Musculoskeletal: no clubbing / cyanosis. No joint deformity upper and lower extremities. Good ROM, no contractures. Normal muscle tone.  Skin: no rashes, lesions, ulcers. No induration Neurologic: CN 2-12 grossly intact. Sensation intact, DTR normal. Strength 5/5 in all 4.  Psychiatric: Normal judgment and insight. Alert and oriented x 3. Normal mood.    Labs on Admission: I have personally reviewed following labs and imaging studies  CBC: Recent Labs  Lab 10/22/18 1736 10/24/18 1631  WBC 5.5 11.6*  HGB 16.1 17.2*  HCT 47.4 49.8  MCV 95.0 95.0  PLT 228 165  Basic Metabolic Panel: Recent Labs  Lab 10/22/18 1736 10/24/18 1631  NA 138 131*  K 3.9 4.5  CL 104 95*  CO2 22 24  GLUCOSE 125* 118*  BUN <5* <5*  CREATININE 0.99 1.08  CALCIUM 8.7* 8.8*   GFR: Estimated Creatinine Clearance: 99.6 mL/min (by C-G formula based on SCr of 1.08 mg/dL). Liver Function Tests: Recent Labs  Lab 10/22/18 1736 10/24/18 1631  AST 89* 59*  ALT 61* 71*  ALKPHOS 125 105  BILITOT 0.6 1.4*  PROT 6.8 5.9*  ALBUMIN 3.8 3.0*   Recent Labs  Lab  10/22/18 1736 10/24/18 1631  LIPASE 352* 189*   No results for input(s): AMMONIA in the last 168 hours. Coagulation Profile: No results for input(s): INR, PROTIME in the last 168 hours. Cardiac Enzymes: No results for input(s): CKTOTAL, CKMB, CKMBINDEX, TROPONINI in the last 168 hours. BNP (last 3 results) No results for input(s): PROBNP in the last 8760 hours. HbA1C: No results for input(s): HGBA1C in the last 72 hours. CBG: No results for input(s): GLUCAP in the last 168 hours. Lipid Profile: No results for input(s): CHOL, HDL, LDLCALC, TRIG, CHOLHDL, LDLDIRECT in the last 72 hours. Thyroid Function Tests: No results for input(s): TSH, T4TOTAL, FREET4, T3FREE, THYROIDAB in the last 72 hours. Anemia Panel: No results for input(s): VITAMINB12, FOLATE, FERRITIN, TIBC, IRON, RETICCTPCT in the last 72 hours. Urine analysis:    Component Value Date/Time   COLORURINE AMBER (A) 10/24/2018 1645   APPEARANCEUR CLEAR 10/24/2018 1645   LABSPEC 1.021 10/24/2018 1645   PHURINE 5.0 10/24/2018 1645   GLUCOSEU NEGATIVE 10/24/2018 1645   HGBUR MODERATE (A) 10/24/2018 1645   BILIRUBINUR NEGATIVE 10/24/2018 1645   KETONESUR NEGATIVE 10/24/2018 1645   PROTEINUR 100 (A) 10/24/2018 1645   NITRITE NEGATIVE 10/24/2018 1645   LEUKOCYTESUR NEGATIVE 10/24/2018 1645    Radiological Exams on Admission: Ct Abdomen Pelvis W Contrast  Result Date: 10/24/2018 CLINICAL DATA:  Persistent abdominal pain with nausea and bloating. Recently diagnosed with pancreatitis. EXAM: CT ABDOMEN AND PELVIS WITH CONTRAST TECHNIQUE: Multidetector CT imaging of the abdomen and pelvis was performed using the standard protocol following bolus administration of intravenous contrast. CONTRAST:  100mL OMNIPAQUE IOHEXOL 300 MG/ML  SOLN COMPARISON:  CT 09/08/2018. FINDINGS: Lower chest: New small left-greater-than-right pleural effusions with dependent atelectasis at both lung bases. Hepatobiliary: Stable tiny low-density lesion in  the left hepatic lobe on image 20/3, probably a cyst. No suspicious hepatic findings. No significant biliary dilatation post cholecystectomy. Pancreas: New mild generalized enlargement of the pancreas with homogeneous enhancement. No evidence of pancreatic hemorrhage or necrosis. There is extensive surrounding inflammation and ill-defined fluid. No evidence of pancreatic ductal dilatation. Spleen: Normal in size without focal abnormality. Adrenals/Urinary Tract: Both adrenal glands appear normal. The kidneys appear normal without evidence of urinary tract calculus, suspicious lesion or hydronephrosis. No bladder abnormalities are seen. Stomach/Bowel: No evidence of bowel wall thickening, distention or surrounding inflammatory change. The appendix appears normal. Vascular/Lymphatic: There are no enlarged abdominal or pelvic lymph nodes. There are small retroperitoneal lymph nodes which are likely reactive. Minimal aortic atherosclerosis. No acute vascular findings. Specifically, the portal, superior mesenteric and splenic veins are patent. Reproductive: The prostate gland and seminal vesicles appear normal. Other: New ascites, predominately around the liver, in the left pericolic gutter and the pelvis. No high-density components or focal extraluminal fluid collections identified. There is generalized retroperitoneal edema, especially around the pancreas. Musculoskeletal: No acute or significant osseous findings. IMPRESSION: 1. Progressive changes of acute   pancreatitis with enlargement of the gland, diffuse surrounding inflammation and ascites. No evidence of pancreatic hemorrhage or necrosis. 2. No biliary dilatation post cholecystectomy. 3. Small bilateral pleural effusions with bibasilar atelectasis. Electronically Signed   By: William  Veazey M.D.   On: 10/24/2018 18:52   Dg Chest Portable 1 View  Result Date: 10/24/2018 CLINICAL DATA:  Diffuse abdominal pain EXAM: PORTABLE CHEST 1 VIEW COMPARISON:  None.  FINDINGS: Minor bandlike atelectasis versus scarring. Lungs otherwise clear. Normal heart size and vascularity. Negative for edema, effusion or pneumothorax. Trachea midline. IMPRESSION: Bibasilar atelectasis versus scarring. Electronically Signed   By: M.  Shick M.D.   On: 10/24/2018 17:39    EKG: Independently reviewed.  Assessment/Plan Active Problems:   Acute alcoholic pancreatitis    1. Acute Pancreatitis - 1. likely related to EtOH use 2. S/p cholecystectomy, no duct dilation, LFTs only mildly bumped and nl alk phos 3. Will check lipid profile in AM just in case 4. IVF: NS at 125 cc/hr 5. Dilaudid PRN pain 6. zofran PRN nausea 7. NPO except sips with meds and ice chips 8. Repeat CMP in AM 9. Will put on CIWA just in case he is underestimating amount of EtOH intake  DVT prophylaxis: Lovenox Code Status: Full Family Communication: No family in room Disposition Plan: Home after admit Consults called: None Admission status: Place in obs   ,  M. DO [facility] Pager [phone] Only works nights!  If 7AM-7PM, please contact the primary day team physician taking care of patient  [email or web address] Password [email or web address]  10/24/2018, 7:55 PM      

## 2018-10-24 NOTE — Progress Notes (Signed)
Pt requesting additional meds for pain, describes his pain is still 10/10 in his abdomen. Dr. Julian ReilGardner was paged and informed about patient status and told patient was already given meds. Provider stated patient can be given additional dose of 1 mg of dilaudid for pain VORB.

## 2018-10-24 NOTE — ED Notes (Signed)
Pt ambulated to restroom to collect urine sample.

## 2018-10-25 NOTE — Discharge Summary (Signed)
Physician Discharge Summary  Austin Jenkins KKX:[id number] DOB: 1986-09-09 DOA: 10/24/2018  PCP: System, Pcp Not In  Admit date: 10/24/2018 Discharge date: 10/25/2018  Admitted From: Home Disposition:  AMA  Recommendations for Outpatient Follow-up:  1. Follow up with PCP in 1-2 weeks 2. Please obtain BMP/CBC in one week 3. Please follow up on the following pending results:  Home Health:AMA Equipment/Devices: None  Discharge Condition: AMA CODE STATUS:Full Diet recommendation: Patient left AMA  Brief/Interim Summary: Patient was admitted for acute EtOH pancreatitis.  Patient left AMA a few hours later.  Discharge Diagnoses:  Principal Problem:   Acute alcoholic pancreatitis    Discharge Instructions Patient left AMA  Allergies as of 10/25/2018   No Known Allergies     Medication List    ASK your doctor about these medications   ALPRAZolam 1 MG tablet Commonly known as:  XANAX Take 1 mg by mouth 2 (two) times daily as needed for anxiety.   HYDROcodone-acetaminophen 5-325 MG tablet Commonly known as:  NORCO/VICODIN Take 1-2 tablets by mouth every 6 (six) hours as needed.   ondansetron 4 MG disintegrating tablet Commonly known as:  ZOFRAN ODT Take 1 tablet (4 mg total) by mouth every 8 (eight) hours as needed.       No Known Allergies  Consultations:  None   Procedures/Studies: Ct Abdomen Pelvis W Contrast  Result Date: 10/24/2018 CLINICAL DATA:  Persistent abdominal pain with nausea and bloating. Recently diagnosed with pancreatitis. EXAM: CT ABDOMEN AND PELVIS WITH CONTRAST TECHNIQUE: Multidetector CT imaging of the abdomen and pelvis was performed using the standard protocol following bolus administration of intravenous contrast. CONTRAST:  OMNIPAQUE IOHEXOL 300 MG/ML  SOLN COMPARISON:  CT 09/08/2018. FINDINGS: Lower chest: New small left-greater-than-right pleural effusions with dependent atelectasis at both lung bases. Hepatobiliary: Stable tiny  low-density lesion in the left hepatic lobe on image 20/3, probably a cyst. No suspicious hepatic findings. No significant biliary dilatation post cholecystectomy. Pancreas: New mild generalized enlargement of the pancreas with homogeneous enhancement. No evidence of pancreatic hemorrhage or necrosis. There is extensive surrounding inflammation and ill-defined fluid. No evidence of pancreatic ductal dilatation. Spleen: Normal in size without focal abnormality. Adrenals/Urinary Tract: Both adrenal glands appear normal. The kidneys appear normal without evidence of urinary tract calculus, suspicious lesion or hydronephrosis. No bladder abnormalities are seen. Stomach/Bowel: No evidence of bowel wall thickening, distention or surrounding inflammatory change. The appendix appears normal. Vascular/Lymphatic: There are no enlarged abdominal or pelvic lymph nodes. There are small retroperitoneal lymph nodes which are likely reactive. Minimal aortic atherosclerosis. No acute vascular findings. Specifically, the portal, superior mesenteric and splenic veins are patent. Reproductive: The prostate gland and seminal vesicles appear normal. Other: New ascites, predominately around the liver, in the left pericolic gutter and the pelvis. No high-density components or focal extraluminal fluid collections identified. There is generalized retroperitoneal edema, especially around the pancreas. Musculoskeletal: No acute or significant osseous findings. IMPRESSION: 1. Progressive changes of acute pancreatitis with enlargement of the gland, diffuse surrounding inflammation and ascites. No evidence of pancreatic hemorrhage or necrosis. 2. No biliary dilatation post cholecystectomy. 3. Small bilateral pleural effusions with bibasilar atelectasis. Electronically Signed   By: Carey Bullocks M.D.   On: 10/24/2018 18:52   Dg Chest Portable 1 View  Result Date: 10/24/2018 CLINICAL DATA:  Diffuse abdominal pain EXAM: PORTABLE CHEST 1 VIEW  COMPARISON:  None. FINDINGS: Minor bandlike atelectasis versus scarring. Lungs otherwise clear. Normal heart size and vascularity. Negative for edema, effusion or pneumothorax. Trachea  midline. IMPRESSION: Bibasilar atelectasis versus scarring. Electronically Signed   By: Judie Petit.  Shick M.D.   On: 10/24/2018 17:39       Subjective:   Discharge Exam: Vitals:   10/24/18 1929 10/24/18 2031  BP: 107/84 121/84  Pulse: (!) 117 (!) 105  Resp: 16 16  Temp:  97.9 F (36.6 C)  SpO2: 98% 98%   Vitals:   10/24/18 1838 10/24/18 1929 10/24/18 2031 10/24/18 2114  BP: 126/90 107/84 121/84   Pulse: (!) 106 (!) 117 (!) 105   Resp:  16 16   Temp:   97.9 F (36.6 C)   TempSrc:   Oral   SpO2: 100% 98% 98%   Weight:    71.3 kg  Height:    5\' 10"  (1.778 m)    General: Pt is alert, awake, not in acute distress Cardiovascular: RRR, S1/S2 +, no rubs, no gallops Respiratory: CTA bilaterally, no wheezing, no rhonchi Abdominal: Soft, NT, ND, bowel sounds + Extremities: no edema, no cyanosis    The results of significant diagnostics from this hospitalization (including imaging, microbiology, ancillary and laboratory) are listed below for reference.     Microbiology: No results found for this or any previous visit (from the past 240 hour(s)).   Labs: BNP (last 3 results) No results for input(s): BNP in the last 8760 hours. Basic Metabolic Panel: Recent Labs  Lab 10/22/18 1736 10/24/18 1631  NA 138 131*  K 3.9 4.5  CL 104 95*  CO2 22 24  GLUCOSE 125* 118*  BUN <5* <5*  CREATININE 0.99 1.08  CALCIUM 8.7* 8.8*   Liver Function Tests: Recent Labs  Lab 10/22/18 1736 10/24/18 1631  AST 89* 59*  ALT 61* 71*  ALKPHOS 125 105  BILITOT 0.6 1.4*  PROT 6.8 5.9*  ALBUMIN 3.8 3.0*   Recent Labs  Lab 10/22/18 1736 10/24/18 1631  LIPASE 352* 189*   No results for input(s): AMMONIA in the last 168 hours. CBC: Recent Labs  Lab 10/22/18 1736 10/24/18 1631  WBC 5.5 11.6*  HGB 16.1  17.2*  HCT 47.4 49.8  MCV 95.0 95.0  PLT 228 165   Cardiac Enzymes: No results for input(s): CKTOTAL, CKMB, CKMBINDEX, TROPONINI in the last 168 hours. BNP: Invalid input(s): POCBNP CBG: No results for input(s): GLUCAP in the last 168 hours. D-Dimer No results for input(s): DDIMER in the last 72 hours. Hgb A1c No results for input(s): HGBA1C in the last 72 hours. Lipid Profile No results for input(s): CHOL, HDL, LDLCALC, TRIG, CHOLHDL, LDLDIRECT in the last 72 hours. Thyroid function studies No results for input(s): TSH, T4TOTAL, T3FREE, THYROIDAB in the last 72 hours.  Invalid input(s): FREET3 Anemia work up No results for input(s): VITAMINB12, FOLATE, FERRITIN, TIBC, IRON, RETICCTPCT in the last 72 hours. Urinalysis    Component Value Date/Time   COLORURINE AMBER (A) 10/24/2018 1645   APPEARANCEUR CLEAR 10/24/2018 1645   LABSPEC 1.021 10/24/2018 1645   PHURINE 5.0 10/24/2018 1645   GLUCOSEU NEGATIVE 10/24/2018 1645   HGBUR MODERATE (A) 10/24/2018 1645   BILIRUBINUR NEGATIVE 10/24/2018 1645   KETONESUR NEGATIVE 10/24/2018 1645   PROTEINUR 100 (A) 10/24/2018 1645   NITRITE NEGATIVE 10/24/2018 1645   LEUKOCYTESUR NEGATIVE 10/24/2018 1645   Sepsis Labs Invalid input(s): PROCALCITONIN,  WBC,  LACTICIDVEN Microbiology No results found for this or any previous visit (from the past 240 hour(s)).     SIGNED:   Hillary Bow., D.O.  [facility] 10/25/2018, 9:15 PM Pager   If  7PM-7AM, please contact night-coverage [email or web address] Password [email or web address]

## 2018-10-25 NOTE — Progress Notes (Signed)
Pt came to NS to inform RN that he needs to leave to go home and take care of his DTR. RN contacted Dr. Julian Reil and informed of Pt wanting to leave. MD advised against it. Pt stated he must leave now. RN had Pt to sign AMA form.

## 2019-04-02 ENCOUNTER — Other Ambulatory Visit: Payer: Self-pay

## 2019-04-02 ENCOUNTER — Emergency Department (HOSPITAL_COMMUNITY)
Admission: EM | Admit: 2019-04-02 | Discharge: 2019-04-03 | Disposition: A | Discharge status: Home / Self Care | Payer: Medicaid Other | Attending: Emergency Medicine | Admitting: Emergency Medicine

## 2019-04-02 ENCOUNTER — Encounter (HOSPITAL_COMMUNITY): Payer: Self-pay | Admitting: *Deleted

## 2019-04-02 DIAGNOSIS — R1032 Left lower quadrant pain: Secondary | ICD-10-CM | POA: Diagnosis present

## 2019-04-02 DIAGNOSIS — K852 Alcohol induced acute pancreatitis without necrosis or infection: Secondary | ICD-10-CM | POA: Diagnosis not present

## 2019-04-02 DIAGNOSIS — F17210 Nicotine dependence, cigarettes, uncomplicated: Secondary | ICD-10-CM | POA: Insufficient documentation

## 2019-04-02 LAB — COMPREHENSIVE METABOLIC PANEL
ALT: 39 U/L (ref 0–44)
AST: 39 U/L (ref 15–41)
Albumin: 3.6 g/dL (ref 3.5–5.0)
Alkaline Phosphatase: 235 U/L — ABNORMAL HIGH (ref 38–126)
Anion gap: 9 (ref 5–15)
BUN: <5 mg/dL — ABNORMAL LOW (ref 6–20)
CO2: 23 mmol/L (ref 22–32)
Calcium: 8.8 mg/dL — ABNORMAL LOW (ref 8.9–10.3)
Chloride: 105 mmol/L (ref 98–111)
Creatinine, Ser: 1.04 mg/dL (ref 0.61–1.24)
GFR calc Af Amer: >60 mL/min (ref >60)
GFR calc non Af Amer: >60 mL/min (ref >60)
Glucose, Bld: 104 mg/dL — ABNORMAL HIGH (ref 70–99)
Potassium: 4.3 mmol/L (ref 3.5–5.1)
Sodium: 137 mmol/L (ref 135–145)
Total Bilirubin: 0.8 mg/dL (ref 0.3–1.2)
Total Protein: 6.4 g/dL — ABNORMAL LOW (ref 6.5–8.1)

## 2019-04-02 LAB — CBC
HCT: 45.6 % (ref 39.0–52.0)
Hemoglobin: 15.4 g/dL (ref 13.0–17.0)
MCH: 31.8 pg (ref 26.0–34.0)
MCHC: 33.8 g/dL (ref 30.0–36.0)
MCV: 94 fL (ref 80.0–100.0)
Platelets: 184 10*3/uL (ref 150–400)
RBC: 4.85 MIL/uL (ref 4.22–5.81)
RDW: 12.2 % (ref 11.5–15.5)
WBC: 9.8 10*3/uL (ref 4.0–10.5)
nRBC: 0 % (ref 0.0–0.2)

## 2019-04-02 LAB — URINALYSIS, ROUTINE W REFLEX MICROSCOPIC
Bilirubin Urine: NEGATIVE
Glucose, UA: NEGATIVE mg/dL
Hgb urine dipstick: NEGATIVE
Ketones, ur: NEGATIVE mg/dL
Leukocytes,Ua: NEGATIVE
Nitrite: NEGATIVE
Protein, ur: NEGATIVE mg/dL
Specific Gravity, Urine: 1.017 (ref 1.005–1.030)
pH: 5 (ref 5.0–8.0)

## 2019-04-02 LAB — LIPASE, BLOOD: Lipase: 211 U/L — ABNORMAL HIGH (ref 11–51)

## 2019-04-02 MED ORDER — SODIUM CHLORIDE 0.9% FLUSH: 3.0000 mL | Freq: Once | INTRAVENOUS | Status: DC

## 2019-04-02 NOTE — ED Triage Notes (Signed)
Pt reports mid abd pain that started today around 1300 with n/v and feels constipated. Last BM was yesterday. Hx of pancreatitis.

## 2019-04-03 ENCOUNTER — Emergency Department (HOSPITAL_COMMUNITY): Payer: Medicaid Other

## 2019-04-03 MED ORDER — HYDROMORPHONE HCL 1 MG/ML IJ SOLN
INTRAMUSCULAR | Status: AC
  Filled 2019-04-03: qty 1

## 2019-04-03 MED ORDER — HYDROCODONE-ACETAMINOPHEN 5-325 MG PO TABS: 1.0000 | ORAL_TABLET | ORAL | 0 refills | Status: DC | PRN

## 2019-04-03 MED ORDER — ONDANSETRON HCL 4 MG PO TABS: 4.0000 mg | ORAL_TABLET | Freq: Four times a day (QID) | ORAL | 0 refills | Status: DC | PRN

## 2019-04-03 MED ORDER — SODIUM CHLORIDE 0.9 % IV BOLUS
1000.0000 mL | Freq: Once | INTRAVENOUS | Status: AC
  Administered 2019-04-03: 1000 mL via INTRAVENOUS

## 2019-04-03 MED ORDER — ONDANSETRON HCL 4 MG/2ML IJ SOLN
4.0000 mg | Freq: Once | INTRAMUSCULAR | Status: AC
  Administered 2019-04-03: 4 mg via INTRAVENOUS
  Filled 2019-04-03: qty 2

## 2019-04-03 MED ORDER — HYDROMORPHONE HCL 1 MG/ML IJ SOLN
1.0000 mg | Freq: Once | INTRAMUSCULAR | Status: AC
  Administered 2019-04-03: 1 mg via INTRAVENOUS

## 2019-04-03 MED ORDER — OXYCODONE-ACETAMINOPHEN 5-325 MG PO TABS
2.0000 | ORAL_TABLET | Freq: Once | ORAL | Status: AC
  Administered 2019-04-03: 2 via ORAL
  Filled 2019-04-03: qty 2

## 2019-04-03 MED ORDER — HYDROMORPHONE HCL 1 MG/ML IJ SOLN
1.0000 mg | Freq: Once | INTRAMUSCULAR | Status: AC
  Administered 2019-04-03: 01:00:00 1 mg via INTRAVENOUS
  Filled 2019-04-03: qty 1

## 2019-04-03 NOTE — Discharge Instructions (Addendum)
Return to this emergency department if you have any worsening pain, fever or nausea and vomiting that does not respond to medication.

## 2019-04-03 NOTE — ED Notes (Signed)
Pt continues to call out asking the plan. EDP and myself at bedside to speak with pt. Informed him that if he feels his pain is tolerable we will discharge... but 30 minutes prior pt is flailing himself around in bed d/t pain. Pt requesting to go as long as he can have Rx for pain medicine. States he will order an [place] home.

## 2019-04-03 NOTE — ED Provider Notes (Signed)
[facility]   CSN: [id number] Arrival date & time: 04/02/19  1938    History   Chief Complaint Chief Complaint  Patient presents with  . Abdominal Pain    HPI Austin Jenkins is a 33 y.o. male.     Patient presents to the emergency department for evaluation of abdominal pain with nausea and vomiting.  Patient reports that his symptoms began around 1 PM today.  He has a history of alcoholic pancreatitis with similar symptoms.  He reports that he has not been drinking since his last hospitalization, but did have a few drinks 1 or 2 days ago.  He has vomited multiple times and is now just dry heaving.  Pain is constant in the left upper abdomen.     Past Medical History:  Diagnosis Date  . Alcohol abuse   . Anxiety   . Depression   . IBS (irritable bowel syndrome)   . Pancreatitis     Patient Active Problem List   Diagnosis Date Noted  . Acute alcoholic pancreatitis 10/24/2018    Past Surgical History:  Procedure Laterality Date  . CHOLECYSTECTOMY          Home Medications    Prior to Admission medications   Medication Sig Start Date End Date Taking? Authorizing Provider  ALPRAZolam Prudy Feeler(XANAX) 1 MG tablet Take 1 mg by mouth 2 (two) times daily as needed for anxiety.   Yes [provider]  buPROPion (WELLBUTRIN SR) 100 MG 12 hr tablet Take 100 mg by mouth daily. 03/28/19  Yes [provider]  HYDROcodone-acetaminophen (NORCO/VICODIN) 5-325 MG tablet Take 1-2 tablets by mouth every 4 (four) hours as needed for moderate pain. 04/03/19   Gilda CreasePollina, Tomer Chalmers J, MD  ondansetron (ZOFRAN) 4 MG tablet Take 1 tablet (4 mg total) by mouth every 6 (six) hours as needed for nausea or vomiting. 04/03/19   Gilda CreasePollina, Sylvan Lahm J, MD    Family History History reviewed. No pertinent family history.  Social History Social History   Tobacco Use  . Smoking status: Current Every Day Smoker    Packs/day: 0.50  .  Smokeless tobacco: Current User  Substance Use Topics  . Alcohol use: Yes    Alcohol/week: 42.0 standard drinks    Types: 42 Cans of beer per week  . Drug use: Not on file     Allergies   Patient has no known allergies.   Review of Systems Review of Systems  Gastrointestinal: Positive for abdominal pain, nausea and vomiting.  All other systems reviewed and are negative.    Physical Exam Updated Vital Signs BP (!) 150/99   Pulse 66   Temp 98.9 F (37.2 C) (Oral)   Resp (!) 24   SpO2 99%   Physical Exam Vitals signs and nursing note reviewed.  Constitutional:      General: He is not in acute distress.    Appearance: Normal appearance. He is well-developed.  HENT:     Head: Normocephalic and atraumatic.     Right Ear: Hearing normal.     Left Ear: Hearing normal.     Nose: Nose normal.  Eyes:     Conjunctiva/sclera: Conjunctivae normal.     Pupils: Pupils are equal, round, and reactive to light.  Neck:     Musculoskeletal: Normal range of motion and neck supple.  Cardiovascular:     Rate and Rhythm: Regular rhythm.     Heart sounds: S1 normal and S2 normal. No murmur.  No friction rub. No gallop.   Pulmonary:     Effort: Pulmonary effort is normal. No respiratory distress.     Breath sounds: Normal breath sounds.  Chest:     Chest wall: No tenderness.  Abdominal:     General: Bowel sounds are normal.     Palpations: Abdomen is soft.     Tenderness: There is abdominal tenderness in the left upper quadrant. There is no guarding or rebound. Negative signs include Murphy's sign and McBurney's sign.     Hernia: No hernia is present.  Musculoskeletal: Normal range of motion.  Skin:    General: Skin is warm and dry.     Findings: No rash.  Neurological:     Mental Status: He is alert and oriented to person, place, and time.     GCS: GCS eye subscore is 4. GCS verbal subscore is 5. GCS motor subscore is 6.     Cranial Nerves: No cranial nerve deficit.     Sensory:  No sensory deficit.     Coordination: Coordination normal.  Psychiatric:        Speech: Speech normal.        Behavior: Behavior normal.        Thought Content: Thought content normal.      ED Treatments / Results  Labs (all labs ordered are listed, but only abnormal results are displayed) Labs Reviewed  LIPASE, BLOOD - Abnormal; Notable for the following components:      Result Value   Lipase 211 (*)    All other components within normal limits  COMPREHENSIVE METABOLIC PANEL - Abnormal; Notable for the following components:   Glucose, Bld 104 (*)    BUN <5 (*)    Calcium 8.8 (*)    Total Protein 6.4 (*)    Alkaline Phosphatase 235 (*)    All other components within normal limits  URINALYSIS, ROUTINE W REFLEX MICROSCOPIC - Abnormal; Notable for the following components:   APPearance HAZY (*)    All other components within normal limits  CBC    EKG None  Radiology No results found.  Procedures Procedures (including critical care time)  Medications Ordered in ED Medications  sodium chloride flush (NS) 0.9 % injection 3 mL (has no administration in time range)  oxyCODONE-acetaminophen (PERCOCET/ROXICET) 5-325 MG per tablet 2 tablet (has no administration in time range)  sodium chloride 0.9 % bolus 1,000 mL (0 mLs Intravenous Stopped 04/03/19 0242)    Followed by  sodium chloride 0.9 % bolus 1,000 mL (0 mLs Intravenous Stopped 04/03/19 0242)  ondansetron (ZOFRAN) injection 4 mg (4 mg Intravenous Given 04/03/19 0125)  HYDROmorphone (DILAUDID) injection 1 mg (1 mg Intravenous Given 04/03/19 0125)  HYDROmorphone (DILAUDID) injection 1 mg (1 mg Intravenous Given 04/03/19 0326)     Initial Impression / Assessment and Plan / ED Course  I have reviewed the triage vital signs and the nursing notes.  Pertinent labs & imaging results that were available during my care of the patient were reviewed by me and considered in my medical decision making (see chart for details).         Patient presents to the emergency department for evaluation of left-sided abdominal pain.  Patient reports a previous history of alcoholic pancreatitis with similar symptoms.  Lab work does reveal a mild elevation of lipase without any other abnormalities.  Patient's abdominal exam reveals mild left upper quadrant tenderness without guarding or rebound.  No concern for an acute surgical process.  Patient ministered IV fluids and Dilaudid.  He had improved pain but as the medicine wore off he started having significant pain again.  At this point, it was felt that he would possibly require hospitalization for further analgesia and bowel rest, CT ordered to rule out pseudocyst and other complications.  Patient, however, does not wish to stay in the ER or be admitted tonight.  He feels that he would prefer to go home with oral pain medication and see if he can get through without admission.  He does not want to be exposed to [place] and he also has an obligation with his son today that he does not want to miss.  Patient understands that his pancreatitis could worsen and this could become a severe illness and even life-threatening.  He also understand that he can come back at any point time if he feels worse.  Will provide a limited supply of pain medication and give pancreatitis discharge instructions, return if he worsens.  Final Clinical Impressions(s) / ED Diagnoses   Final diagnoses:  Alcohol-induced acute pancreatitis, unspecified complication status    ED Discharge Orders         Ordered    HYDROcodone-acetaminophen (NORCO/VICODIN) 5-325 MG tablet  Every 4 hours PRN     04/03/19 0438    ondansetron (ZOFRAN) 4 MG tablet  Every 6 hours PRN     04/03/19 0438           Orpah Greek, MD 04/03/19 [phone]

## 2019-04-06 ENCOUNTER — Emergency Department (HOSPITAL_COMMUNITY)
Admission: EM | Admit: 2019-04-06 | Discharge: 2019-04-06 | Disposition: A | Discharge status: Home / Self Care | Payer: Medicaid Other | Attending: Emergency Medicine | Admitting: Emergency Medicine

## 2019-04-06 ENCOUNTER — Other Ambulatory Visit: Payer: Self-pay

## 2019-04-06 ENCOUNTER — Encounter (HOSPITAL_COMMUNITY): Payer: Self-pay | Admitting: Emergency Medicine

## 2019-04-06 DIAGNOSIS — F17290 Nicotine dependence, other tobacco product, uncomplicated: Secondary | ICD-10-CM | POA: Diagnosis not present

## 2019-04-06 DIAGNOSIS — K5903 Drug induced constipation: Secondary | ICD-10-CM

## 2019-04-06 DIAGNOSIS — T402X5A Adverse effect of other opioids, initial encounter: Secondary | ICD-10-CM | POA: Diagnosis not present

## 2019-04-06 DIAGNOSIS — Z79899 Other long term (current) drug therapy: Secondary | ICD-10-CM | POA: Insufficient documentation

## 2019-04-06 DIAGNOSIS — R1013 Epigastric pain: Secondary | ICD-10-CM

## 2019-04-06 LAB — URINALYSIS, ROUTINE W REFLEX MICROSCOPIC
Bacteria, UA: NONE SEEN
Bilirubin Urine: NEGATIVE
Glucose, UA: NEGATIVE mg/dL
Ketones, ur: NEGATIVE mg/dL
Leukocytes,Ua: NEGATIVE
Nitrite: NEGATIVE
Protein, ur: NEGATIVE mg/dL
Specific Gravity, Urine: 1.012 (ref 1.005–1.030)
pH: 5 (ref 5.0–8.0)

## 2019-04-06 LAB — CBC
HCT: 41.7 % (ref 39.0–52.0)
Hemoglobin: 14.2 g/dL (ref 13.0–17.0)
MCH: 32.7 pg (ref 26.0–34.0)
MCHC: 34.1 g/dL (ref 30.0–36.0)
MCV: 96.1 fL (ref 80.0–100.0)
Platelets: 119 10*3/uL — ABNORMAL LOW (ref 150–400)
RBC: 4.34 MIL/uL (ref 4.22–5.81)
RDW: 12.5 % (ref 11.5–15.5)
WBC: 7 10*3/uL (ref 4.0–10.5)
nRBC: 0 % (ref 0.0–0.2)

## 2019-04-06 LAB — COMPREHENSIVE METABOLIC PANEL
ALT: 32 U/L (ref 0–44)
AST: 21 U/L (ref 15–41)
Albumin: 2.7 g/dL — ABNORMAL LOW (ref 3.5–5.0)
Alkaline Phosphatase: 161 U/L — ABNORMAL HIGH (ref 38–126)
Anion gap: 10 (ref 5–15)
BUN: <5 mg/dL — ABNORMAL LOW (ref 6–20)
CO2: 29 mmol/L (ref 22–32)
Calcium: 8.4 mg/dL — ABNORMAL LOW (ref 8.9–10.3)
Chloride: 97 mmol/L — ABNORMAL LOW (ref 98–111)
Creatinine, Ser: 0.84 mg/dL (ref 0.61–1.24)
GFR calc Af Amer: >60 mL/min (ref >60)
GFR calc non Af Amer: >60 mL/min (ref >60)
Glucose, Bld: 124 mg/dL — ABNORMAL HIGH (ref 70–99)
Potassium: 3.3 mmol/L — ABNORMAL LOW (ref 3.5–5.1)
Sodium: 136 mmol/L (ref 135–145)
Total Bilirubin: 1.1 mg/dL (ref 0.3–1.2)
Total Protein: 5.8 g/dL — ABNORMAL LOW (ref 6.5–8.1)

## 2019-04-06 LAB — LIPASE, BLOOD: Lipase: 37 U/L (ref 11–51)

## 2019-04-06 MED ORDER — PANTOPRAZOLE SODIUM 40 MG PO TBEC: 40.0000 mg | DELAYED_RELEASE_TABLET | Freq: Every day | ORAL | 0 refills | Status: DC

## 2019-04-06 MED ORDER — PANTOPRAZOLE SODIUM 40 MG PO TBEC
40.0000 mg | DELAYED_RELEASE_TABLET | Freq: Once | ORAL | Status: AC
  Administered 2019-04-06: 40 mg via ORAL
  Filled 2019-04-06: qty 1

## 2019-04-06 MED ORDER — LIDOCAINE VISCOUS HCL 2 % MT SOLN
15.0000 mL | Freq: Once | OROMUCOSAL | Status: AC
  Administered 2019-04-06: 15 mL via ORAL
  Filled 2019-04-06: qty 15

## 2019-04-06 MED ORDER — HYDROMORPHONE HCL 1 MG/ML IJ SOLN
1.0000 mg | Freq: Once | INTRAMUSCULAR | Status: AC
  Administered 2019-04-06: 1 mg via INTRAVENOUS
  Filled 2019-04-06: qty 1

## 2019-04-06 MED ORDER — MAGNESIUM CITRATE PO SOLN
1.0000 | Freq: Once | ORAL | Status: AC
  Administered 2019-04-06: 1 via ORAL
  Filled 2019-04-06: qty 296

## 2019-04-06 MED ORDER — SODIUM CHLORIDE 0.9 % IV BOLUS
1000.0000 mL | Freq: Once | INTRAVENOUS | Status: AC
  Administered 2019-04-06: 1000 mL via INTRAVENOUS

## 2019-04-06 MED ORDER — ONDANSETRON HCL 4 MG/2ML IJ SOLN
4.0000 mg | Freq: Once | INTRAMUSCULAR | Status: AC
  Administered 2019-04-06: 4 mg via INTRAVENOUS
  Filled 2019-04-06: qty 2

## 2019-04-06 MED ORDER — ALUM & MAG HYDROXIDE-SIMETH 200-200-20 MG/5ML PO SUSP
30.0000 mL | Freq: Once | ORAL | Status: AC
  Administered 2019-04-06: 30 mL via ORAL
  Filled 2019-04-06: qty 30

## 2019-04-06 NOTE — ED Provider Notes (Signed)
[facility]   CSN: [id number] Arrival date & time: 04/06/19  0447    History   Chief Complaint Chief Complaint  Patient presents with  . Abdominal Pain  . Constipation    HPI Maurine MinisterMichael Muramoto is a 33 y.o. male.   The history is provided by the patient.  Abdominal Pain Associated symptoms: constipation   Constipation Associated symptoms: abdominal pain   He has history of alcoholic pancreatitis and was seen in the emergency department last week for same and comes in with recurrence of upper abdominal pain.  Pain is in the epigastric area and does not radiate.  He currently rates pain at 8/10.  He states that he was initially doing well, the pain started coming back yesterday.  He denies any ethanol ingestion since he had been in the ED last week.  There has been some nausea but no vomiting.  He states he has not had a bowel movement since then.  He had been taking hydrocodone-acetaminophen which had been prescribed and this was initially giving him relief but is no longer giving him relief.  He also had been taking ibuprofen.  Past Medical History:  Diagnosis Date  . Alcohol abuse   . Anxiety   . Depression   . IBS (irritable bowel syndrome)   . Pancreatitis     Patient Active Problem List   Diagnosis Date Noted  . Acute alcoholic pancreatitis 10/24/2018    Past Surgical History:  Procedure Laterality Date  . CHOLECYSTECTOMY          Home Medications    Prior to Admission medications   Medication Sig Start Date End Date Taking? Authorizing Provider  ALPRAZolam Prudy Feeler(XANAX) 1 MG tablet Take 1 mg by mouth 2 (two) times daily as needed for anxiety.    [provider]  buPROPion (WELLBUTRIN SR) 100 MG 12 hr tablet Take 100 mg by mouth daily. 03/28/19   [provider]  HYDROcodone-acetaminophen (NORCO/VICODIN) 5-325 MG tablet Take 1-2 tablets by mouth every 4 (four) hours as needed for moderate pain.  04/03/19   Gilda CreasePollina, Christopher J, MD  ondansetron (ZOFRAN) 4 MG tablet Take 1 tablet (4 mg total) by mouth every 6 (six) hours as needed for nausea or vomiting. 04/03/19   Gilda CreasePollina, Christopher J, MD    Family History No family history on file.  Social History Social History   Tobacco Use  . Smoking status: Current Every Day Smoker    Packs/day: 0.50  . Smokeless tobacco: Current User  Substance Use Topics  . Alcohol use: Yes    Alcohol/week: 42.0 standard drinks    Types: 42 Cans of beer per week  . Drug use: Not on file     Allergies   Patient has no known allergies.   Review of Systems Review of Systems  Gastrointestinal: Positive for abdominal pain and constipation.  All other systems reviewed and are negative.    Physical Exam Updated Vital Signs BP 109/89 (BP Location: Left Arm)   Pulse (!) 115   Temp 98.5 F (36.9 C) (Oral)   Resp 20   SpO2 98%   Physical Exam Vitals signs and nursing note reviewed.    33 year old male, resting comfortably and in no acute distress. Vital signs are significant for rapid heart rate. Oxygen saturation is 98%, which is normal. Head is normocephalic and atraumatic. PERRLA, EOMI. Oropharynx is clear. Neck is nontender and supple without adenopathy or JVD. Back is nontender  and there is no CVA tenderness. Lungs are clear without rales, wheezes, or rhonchi. Chest is nontender. Heart has regular rate and rhythm without murmur. Abdomen is soft, flat, with mild epigastric tenderness.  There is no rebound or guarding.  There are no masses or hepatosplenomegaly and peristalsis is hypoactive. Extremities have no cyanosis or edema, full range of motion is present. Skin is warm and dry without rash. Neurologic: Mental status is normal, cranial nerves are intact, there are no motor or sensory deficits.  ED Treatments / Results  Labs (all labs ordered are listed, but only abnormal results are displayed) Labs Reviewed  COMPREHENSIVE  METABOLIC PANEL - Abnormal; Notable for the following components:      Result Value   Potassium 3.3 (*)    Chloride 97 (*)    Glucose, Bld 124 (*)    BUN <5 (*)    Calcium 8.4 (*)    Total Protein 5.8 (*)    Albumin 2.7 (*)    Alkaline Phosphatase 161 (*)    All other components within normal limits  CBC - Abnormal; Notable for the following components:   Platelets 119 (*)    All other components within normal limits  URINALYSIS, ROUTINE W REFLEX MICROSCOPIC - Abnormal; Notable for the following components:   Hgb urine dipstick SMALL (*)    All other components within normal limits  LIPASE, BLOOD    Procedures Procedures   Medications Ordered in ED Medications  sodium chloride 0.9 % bolus 1,000 mL (0 mLs Intravenous Stopped 04/06/19 0720)  HYDROmorphone (DILAUDID) injection 1 mg (1 mg Intravenous Given 04/06/19 0626)  ondansetron (ZOFRAN) injection 4 mg (4 mg Intravenous Given 04/06/19 0624)  alum & mag hydroxide-simeth (MAALOX/MYLANTA) 200-200-20 MG/5ML suspension 30 mL (30 mLs Oral Given 04/06/19 0636)    And  lidocaine (XYLOCAINE) 2 % viscous mouth solution 15 mL (15 mLs Oral Given 04/06/19 0636)  pantoprazole (PROTONIX) EC tablet 40 mg (40 mg Oral Given 04/06/19 0635)  magnesium citrate solution 1 Bottle (1 Bottle Oral Given 04/06/19 0654)     Initial Impression / Assessment and Plan / ED Course  I have reviewed the triage vital signs and the nursing notes.  Pertinent labs & imaging results that were available during my care of the patient were reviewed by me and considered in my medical decision making (see chart for details).  Recurrent epigastric pain and patient with history of alcoholic pancreatitis.  Old records are reviewed confirming ED visit on June 30 for alcoholic pancreatitis.  Lipase at that time was 211.  Exam today is benign.  Will recheck lipase level.  I suspect that some of his pain may be gastritis secondary to NSAID use.  Constipation is likely secondary to narcotic  use.  He will be given hydromorphone for pain and given a GI cocktail and a dose of pantoprazole.  He got significant relief with above-noted treatment.  Lipase is come back normal.  At this point, no evidence of ongoing pancreatitis, pain is either gastritis or ulcer secondary to NSAID use.  He is advised to discontinue NSAIDs, discharged with prescription for pantoprazole, told to use over-the-counter antacids as needed.  Given a dose of magnesium citrate to treat his constipation.  Final Clinical Impressions(s) / ED Diagnoses   Final diagnoses:  Epigastric pain  Therapeutic opioid induced constipation    ED Discharge Orders         Ordered    pantoprazole (PROTONIX) 40 MG tablet  Daily  04/06/19 9735           Delora Fuel, MD 32/99/24 [phone]

## 2019-04-06 NOTE — Discharge Instructions (Addendum)
Stop taking ibuprofen.  Take antacids as needed.

## 2019-04-06 NOTE — ED Notes (Signed)
ED Provider at bedside. 

## 2019-04-06 NOTE — ED Triage Notes (Signed)
Pt states he was seen in ED on Tuesday night for pancreatitis.  States pain initially got better and now worse.  Reports constipation since Monday.

## 2019-05-05 ENCOUNTER — Emergency Department (HOSPITAL_COMMUNITY): Payer: Medicaid Other

## 2019-05-05 ENCOUNTER — Emergency Department (HOSPITAL_COMMUNITY)
Admission: EM | Admit: 2019-05-05 | Discharge: 2019-05-05 | Discharge status: Against Medical Advice | Payer: Medicaid Other | Attending: Emergency Medicine | Admitting: Emergency Medicine

## 2019-05-05 ENCOUNTER — Other Ambulatory Visit: Payer: Self-pay

## 2019-05-05 ENCOUNTER — Encounter (HOSPITAL_COMMUNITY): Payer: Self-pay | Admitting: *Deleted

## 2019-05-05 DIAGNOSIS — R1010 Upper abdominal pain, unspecified: Secondary | ICD-10-CM | POA: Diagnosis present

## 2019-05-05 DIAGNOSIS — Z79899 Other long term (current) drug therapy: Secondary | ICD-10-CM | POA: Diagnosis not present

## 2019-05-05 DIAGNOSIS — I82890 Acute embolism and thrombosis of other specified veins: Secondary | ICD-10-CM

## 2019-05-05 DIAGNOSIS — F17220 Nicotine dependence, chewing tobacco, uncomplicated: Secondary | ICD-10-CM | POA: Insufficient documentation

## 2019-05-05 DIAGNOSIS — Z5320 Procedure and treatment not carried out because of patient's decision for unspecified reasons: Secondary | ICD-10-CM | POA: Diagnosis not present

## 2019-05-05 DIAGNOSIS — K859 Acute pancreatitis without necrosis or infection, unspecified: Secondary | ICD-10-CM | POA: Diagnosis not present

## 2019-05-05 DIAGNOSIS — F17200 Nicotine dependence, unspecified, uncomplicated: Secondary | ICD-10-CM | POA: Insufficient documentation

## 2019-05-05 LAB — CBC WITH DIFFERENTIAL/PLATELET
Abs Immature Granulocytes: 0.02 10*3/uL (ref 0.00–0.07)
Basophils Absolute: 0 10*3/uL (ref 0.0–0.1)
Basophils Relative: 1 %
Eosinophils Absolute: 0.3 10*3/uL (ref 0.0–0.5)
Eosinophils Relative: 4 %
HCT: 43 % (ref 39.0–52.0)
Hemoglobin: 14 g/dL (ref 13.0–17.0)
Immature Granulocytes: 0 %
Lymphocytes Relative: 16 %
Lymphs Abs: 1.3 10*3/uL (ref 0.7–4.0)
MCH: 31.5 pg (ref 26.0–34.0)
MCHC: 32.6 g/dL (ref 30.0–36.0)
MCV: 96.8 fL (ref 80.0–100.0)
Monocytes Absolute: 0.5 10*3/uL (ref 0.1–1.0)
Monocytes Relative: 6 %
Neutro Abs: 5.8 10*3/uL (ref 1.7–7.7)
Neutrophils Relative %: 73 %
Platelets: 154 10*3/uL (ref 150–400)
RBC: 4.44 MIL/uL (ref 4.22–5.81)
RDW: 13 % (ref 11.5–15.5)
WBC: 7.8 10*3/uL (ref 4.0–10.5)
nRBC: 0 % (ref 0.0–0.2)

## 2019-05-05 LAB — COMPREHENSIVE METABOLIC PANEL
ALT: 14 U/L (ref 0–44)
AST: 24 U/L (ref 15–41)
Albumin: 2.8 g/dL — ABNORMAL LOW (ref 3.5–5.0)
Alkaline Phosphatase: 207 U/L — ABNORMAL HIGH (ref 38–126)
Anion gap: 12 (ref 5–15)
BUN: <5 mg/dL — ABNORMAL LOW (ref 6–20)
CO2: 24 mmol/L (ref 22–32)
Calcium: 8.4 mg/dL — ABNORMAL LOW (ref 8.9–10.3)
Chloride: 101 mmol/L (ref 98–111)
Creatinine, Ser: 0.89 mg/dL (ref 0.61–1.24)
GFR calc Af Amer: >60 mL/min (ref >60)
GFR calc non Af Amer: >60 mL/min (ref >60)
Glucose, Bld: 106 mg/dL — ABNORMAL HIGH (ref 70–99)
Potassium: 3.2 mmol/L — ABNORMAL LOW (ref 3.5–5.1)
Sodium: 137 mmol/L (ref 135–145)
Total Bilirubin: 0.8 mg/dL (ref 0.3–1.2)
Total Protein: 6 g/dL — ABNORMAL LOW (ref 6.5–8.1)

## 2019-05-05 LAB — LIPASE, BLOOD: Lipase: 91 U/L — ABNORMAL HIGH (ref 11–51)

## 2019-05-05 MED ORDER — OXYCODONE HCL 5 MG PO TABS: 5.0000 mg | ORAL_TABLET | ORAL | 0 refills | Status: DC | PRN

## 2019-05-05 MED ORDER — ONDANSETRON 4 MG PO TBDP: 4.0000 mg | ORAL_TABLET | Freq: Three times a day (TID) | ORAL | 0 refills | Status: DC | PRN

## 2019-05-05 MED ORDER — SODIUM CHLORIDE 0.9 % IV BOLUS
1000.0000 mL | Freq: Once | INTRAVENOUS | Status: AC
  Administered 2019-05-05: 1000 mL via INTRAVENOUS

## 2019-05-05 MED ORDER — HYDROMORPHONE HCL 1 MG/ML IJ SOLN
1.0000 mg | Freq: Once | INTRAMUSCULAR | Status: AC
  Administered 2019-05-05: 1 mg via INTRAVENOUS
  Filled 2019-05-05: qty 1

## 2019-05-05 MED ORDER — OXYCODONE HCL 5 MG PO TABS
5.0000 mg | ORAL_TABLET | Freq: Once | ORAL | Status: AC
  Administered 2019-05-05: 5 mg via ORAL
  Filled 2019-05-05: qty 1

## 2019-05-05 MED ORDER — PANTOPRAZOLE SODIUM 40 MG IV SOLR
40.0000 mg | Freq: Once | INTRAVENOUS | Status: AC
  Administered 2019-05-05: 40 mg via INTRAVENOUS
  Filled 2019-05-05: qty 40

## 2019-05-05 MED ORDER — ONDANSETRON HCL 4 MG/2ML IJ SOLN
4.0000 mg | Freq: Once | INTRAMUSCULAR | Status: AC
  Administered 2019-05-05: 4 mg via INTRAVENOUS
  Filled 2019-05-05: qty 2

## 2019-05-05 MED ORDER — IOHEXOL 300 MG/ML  SOLN
100.0000 mL | Freq: Once | INTRAMUSCULAR | Status: AC | PRN
  Administered 2019-05-05: 100 mL via INTRAVENOUS

## 2019-05-05 NOTE — ED Provider Notes (Signed)
[facility]   CSN: [id number] Arrival date & time: 05/05/19  [id number]     History   Chief Complaint Chief Complaint  Patient presents with  . Abdominal Pain    HPI Austin Jenkins is a 33 y.o. male.     Patient presents with 2 days of upper abdominal pain associated with nausea.  Reports history of pancreatitis as well as gastritis and this feels similar.  Also has history of IBS.  He was seen for the same condition on June 30 and diagnosed with pancreatitis and then seen on July 4 and diagnosed with gastritis.  He states he is been cutting back on ibuprofen and alcohol and denies any recent alcohol intake.  States he has had nausea but no vomiting.  The pain is sharp, progressively worsening and constant.  Does not improve by ibuprofen.  He denies any dark or bloody stools.  Denies any chest pain or shortness of breath.  No pain with urination or blood in the urine.  States compliance with Protonix.  Reports EGD 15 years ago showed "bleeding on my stomach" but no recent dark or bloody stools  The history is provided by the patient.  Abdominal Pain Associated symptoms: constipation and nausea   Associated symptoms: no chest pain, no cough, no dysuria, no hematuria, no shortness of breath and no vomiting     Past Medical History:  Diagnosis Date  . Alcohol abuse   . Anxiety   . Depression   . IBS (irritable bowel syndrome)   . Pancreatitis     Patient Active Problem List   Diagnosis Date Noted  . Acute alcoholic pancreatitis 10/24/2018    Past Surgical History:  Procedure Laterality Date  . CHOLECYSTECTOMY          Home Medications    Prior to Admission medications   Medication Sig Start Date End Date Taking? Authorizing Provider  ALPRAZolam Prudy Feeler(XANAX) 1 MG tablet Take 1 mg by mouth 2 (two) times daily as needed for anxiety.    [provider]  buPROPion (WELLBUTRIN SR) 100 MG 12 hr tablet Take 100 mg by mouth  daily. 03/28/19   [provider]  HYDROcodone-acetaminophen (NORCO/VICODIN) 5-325 MG tablet Take 1-2 tablets by mouth every 4 (four) hours as needed for moderate pain. 04/03/19   Gilda CreasePollina, Christopher J, MD  ondansetron (ZOFRAN) 4 MG tablet Take 1 tablet (4 mg total) by mouth every 6 (six) hours as needed for nausea or vomiting. 04/03/19   Gilda CreasePollina, Christopher J, MD  pantoprazole (PROTONIX) 40 MG tablet Take 1 tablet (40 mg total) by mouth daily. 04/06/19   Dione BoozeGlick, David, MD    Family History No family history on file.  Social History Social History   Tobacco Use  . Smoking status: Current Every Day Smoker    Packs/day: 0.50  . Smokeless tobacco: Current User  Substance Use Topics  . Alcohol use: Yes    Alcohol/week: 42.0 standard drinks    Types: 42 Cans of beer per week  . Drug use: Not on file     Allergies   Patient has no known allergies.   Review of Systems Review of Systems  Constitutional: Positive for appetite change. Negative for activity change.  HENT: Negative for congestion and rhinorrhea.   Eyes: Negative for visual disturbance.  Respiratory: Negative for cough, chest tightness and shortness of breath.   Cardiovascular: Negative for chest pain.  Gastrointestinal: Positive for abdominal pain, constipation and nausea. Negative  for vomiting.  Genitourinary: Negative for dysuria and hematuria.  Musculoskeletal: Negative for arthralgias and myalgias.  Skin: Negative for rash.  Neurological: Negative for dizziness and headaches.  Psychiatric/Behavioral: Behavioral problem: .ro.    all other systems are negative except as noted in the HPI and PMH.    Physical Exam Updated Vital Signs BP (!) 124/91 (BP Location: Right Arm)   Pulse 90   Temp 97.8 F (36.6 C) (Oral)   Resp 16   SpO2 100%   Physical Exam Vitals signs and nursing note reviewed.  Constitutional:      General: He is not in acute distress.    Appearance: He is well-developed.     Comments:  uncomfortable  HENT:     Head: Normocephalic and atraumatic.     Mouth/Throat:     Pharynx: No oropharyngeal exudate.  Eyes:     Conjunctiva/sclera: Conjunctivae normal.     Pupils: Pupils are equal, round, and reactive to light.  Neck:     Musculoskeletal: Normal range of motion and neck supple.     Comments: No meningismus. Cardiovascular:     Rate and Rhythm: Normal rate and regular rhythm.     Heart sounds: Normal heart sounds. No murmur.  Pulmonary:     Effort: Pulmonary effort is normal. No respiratory distress.     Breath sounds: Normal breath sounds.  Abdominal:     Palpations: Abdomen is soft.     Tenderness: There is abdominal tenderness. There is guarding. There is no rebound.     Comments: Epigastric and right upper quadrant tenderness with guarding.  No rebound.  No lower abdominal tenderness  Musculoskeletal: Normal range of motion.        General: No tenderness.  Skin:    General: Skin is warm.     Capillary Refill: Capillary refill takes less than 2 seconds.  Neurological:     General: No focal deficit present.     Mental Status: He is alert and oriented to person, place, and time. Mental status is at baseline.     Cranial Nerves: No cranial nerve deficit.     Motor: No abnormal muscle tone.     Coordination: Coordination normal.     Comments: No ataxia on finger to nose bilaterally. No pronator drift. 5/5 strength throughout. CN 2-12 intact.Equal grip strength. Sensation intact.   Psychiatric:        Behavior: Behavior normal.      ED Treatments / Results  Labs (all labs ordered are listed, but only abnormal results are displayed) Labs Reviewed  COMPREHENSIVE METABOLIC PANEL - Abnormal; Notable for the following components:      Result Value   Potassium 3.2 (*)    Glucose, Bld 106 (*)    BUN <5 (*)    Calcium 8.4 (*)    Total Protein 6.0 (*)    Albumin 2.8 (*)    Alkaline Phosphatase 207 (*)    All other components within normal limits  LIPASE,  BLOOD - Abnormal; Notable for the following components:   Lipase 91 (*)    All other components within normal limits  CBC WITH DIFFERENTIAL/PLATELET    EKG None  Radiology No results found.  Procedures Procedures (including critical care time)  Medications Ordered in ED Medications  sodium chloride 0.9 % bolus 1,000 mL (has no administration in time range)  ondansetron (ZOFRAN) injection 4 mg (has no administration in time range)  HYDROmorphone (DILAUDID) injection 1 mg (has no administration in time  range)  pantoprazole (PROTONIX) injection 40 mg (has no administration in time range)     Initial Impression / Assessment and Plan / ED Course  I have reviewed the triage vital signs and the nursing notes.  Pertinent labs & imaging results that were available during my care of the patient were reviewed by me and considered in my medical decision making (see chart for details).       Upper abdominal pain with nausea similar to previous episodes of pancreatitis.  Vitals stable.  Afebrile.  Abdomen soft with guarding in the epigastrium.  Patient given IV fluids and symptom control.  His labs showed no leukocytosis.  Lipase today is 91 which is mildly elevated from his previous.  Suspect combination of pain from ongoing gastritis or ulcer but may have component of pancreatitis as well.  With ongoing pain and voluntary guarding will obtain CT imaging.  Case will be signed out to DrMarland Kitchen Billy Fischer at shift change.   Final Clinical Impressions(s) / ED Diagnoses   Final diagnoses:  None    ED Discharge Orders    None       Charvez Voorhies, Annie Main, MD 05/05/19 [phone]

## 2019-05-05 NOTE — ED Provider Notes (Signed)
33 year old male with history of pancreatitis presents with concern for abdominal pain.  Patient care of patient at 7 AM.  Please see Dr. Faythe Dingwall note for prior history, physical and care.  CT pending.  CT shows evidence of acute pancreatitis. Elevated, likely chronic pancreatitis.  CT also significant for collateral veins concerning for splenic venous thrombosis.  Consulted with our GI, who recommended admission on heparin and further evaluation.  Patient reports he would like to leave the emergency department he has normal vitals.  He understands the risks.  He given prescription for pain control and nausea medications.  Recommend gastroenterology follow-up.   Gareth Morgan, MD 05/05/19 2358

## 2019-05-05 NOTE — ED Notes (Signed)
Patient transported to CT 

## 2019-05-05 NOTE — ED Triage Notes (Signed)
Pt c/o upper abdominal pain, worse in RUQ; hx of IBS and pancreatitis

## 2019-05-06 ENCOUNTER — Telehealth: Payer: Self-pay

## 2019-05-06 NOTE — Telephone Encounter (Signed)
I placed a call to the pt with no answer.  Voice mail left for the pt to call and schedule appt if he wishes to do so.

## 2019-05-06 NOTE — Telephone Encounter (Signed)
-----   Message from Irving Copas., MD sent at 05/05/2019 10:14 AM EDT ----- Regarding: New Patient Austin Jenkins,This is a patient that should have come into the hospital over the weekend however has left [facility] due to other issues.I would be fine to try and work with this young gentleman if he likes, otherwise he can see any gastroenterologist in [place] if he so chooses for his recurrent pancreas issues as he is an unassigned patient. If he is interested in seeing [place] please offer him a clinic visit in the next 3 weeks.If my dates are full then let me know, and we can see if he can be seen by 1 of the PAs and I can be available for them as a resource.   Again if he wants to follow-up, up to patient.Thanks.GM

## 2019-05-30 ENCOUNTER — Other Ambulatory Visit: Payer: Self-pay

## 2019-05-30 ENCOUNTER — Emergency Department (HOSPITAL_COMMUNITY)
Admission: EM | Admit: 2019-05-30 | Discharge: 2019-05-31 | Disposition: A | Discharge status: Home / Self Care | Payer: Medicaid Other | Attending: Emergency Medicine | Admitting: Emergency Medicine

## 2019-05-30 ENCOUNTER — Encounter (HOSPITAL_COMMUNITY): Payer: Self-pay

## 2019-05-30 DIAGNOSIS — R748 Abnormal levels of other serum enzymes: Secondary | ICD-10-CM | POA: Diagnosis not present

## 2019-05-30 DIAGNOSIS — Z79899 Other long term (current) drug therapy: Secondary | ICD-10-CM | POA: Insufficient documentation

## 2019-05-30 DIAGNOSIS — R1011 Right upper quadrant pain: Secondary | ICD-10-CM | POA: Diagnosis not present

## 2019-05-30 DIAGNOSIS — R1013 Epigastric pain: Secondary | ICD-10-CM | POA: Insufficient documentation

## 2019-05-30 DIAGNOSIS — F17210 Nicotine dependence, cigarettes, uncomplicated: Secondary | ICD-10-CM | POA: Insufficient documentation

## 2019-05-30 DIAGNOSIS — R7309 Other abnormal glucose: Secondary | ICD-10-CM

## 2019-05-30 DIAGNOSIS — R1010 Upper abdominal pain, unspecified: Secondary | ICD-10-CM

## 2019-05-30 LAB — CBC
HCT: 44.5 % (ref 39.0–52.0)
Hemoglobin: 14.2 g/dL (ref 13.0–17.0)
MCH: 31.1 pg (ref 26.0–34.0)
MCHC: 31.9 g/dL (ref 30.0–36.0)
MCV: 97.6 fL (ref 80.0–100.0)
Platelets: 307 10*3/uL (ref 150–400)
RBC: 4.56 MIL/uL (ref 4.22–5.81)
RDW: 13.3 % (ref 11.5–15.5)
WBC: 8.7 10*3/uL (ref 4.0–10.5)
nRBC: 0 % (ref 0.0–0.2)

## 2019-05-30 LAB — COMPREHENSIVE METABOLIC PANEL
ALT: 19 U/L (ref 0–44)
AST: 16 U/L (ref 15–41)
Albumin: 2.8 g/dL — ABNORMAL LOW (ref 3.5–5.0)
Alkaline Phosphatase: 292 U/L — ABNORMAL HIGH (ref 38–126)
Anion gap: 9 (ref 5–15)
BUN: 6 mg/dL (ref 6–20)
CO2: 27 mmol/L (ref 22–32)
Calcium: 8.7 mg/dL — ABNORMAL LOW (ref 8.9–10.3)
Chloride: 101 mmol/L (ref 98–111)
Creatinine, Ser: 0.75 mg/dL (ref 0.61–1.24)
GFR calc Af Amer: >60 mL/min (ref >60)
GFR calc non Af Amer: >60 mL/min (ref >60)
Glucose, Bld: 133 mg/dL — ABNORMAL HIGH (ref 70–99)
Potassium: 3.6 mmol/L (ref 3.5–5.1)
Sodium: 137 mmol/L (ref 135–145)
Total Bilirubin: 0.6 mg/dL (ref 0.3–1.2)
Total Protein: 6.4 g/dL — ABNORMAL LOW (ref 6.5–8.1)

## 2019-05-30 LAB — URINALYSIS, ROUTINE W REFLEX MICROSCOPIC
Bilirubin Urine: NEGATIVE
Glucose, UA: NEGATIVE mg/dL
Hgb urine dipstick: NEGATIVE
Ketones, ur: NEGATIVE mg/dL
Leukocytes,Ua: NEGATIVE
Nitrite: NEGATIVE
Protein, ur: NEGATIVE mg/dL
Specific Gravity, Urine: 1.017 (ref 1.005–1.030)
pH: 8 (ref 5.0–8.0)

## 2019-05-30 LAB — LIPASE, BLOOD: Lipase: 38 U/L (ref 11–51)

## 2019-05-30 NOTE — ED Triage Notes (Signed)
Pt arrives POV for eval of abd pain, hx of same, seen by GI last week. Known pancreatitis. Pt denies ETOH use. Reports + N/V

## 2019-05-31 MED ORDER — HYDROCODONE-ACETAMINOPHEN 5-325 MG PO TABS
1.0000 | ORAL_TABLET | Freq: Once | ORAL | Status: AC
  Administered 2019-05-31: 03:00:00 1 via ORAL
  Filled 2019-05-31: qty 1

## 2019-05-31 MED ORDER — PANTOPRAZOLE SODIUM 40 MG PO TBEC: 40.0000 mg | DELAYED_RELEASE_TABLET | Freq: Every day | ORAL | 0 refills | Status: DC

## 2019-05-31 MED ORDER — PANTOPRAZOLE SODIUM 40 MG PO TBEC
40.0000 mg | DELAYED_RELEASE_TABLET | Freq: Once | ORAL | Status: AC
  Administered 2019-05-31: 03:00:00 40 mg via ORAL
  Filled 2019-05-31: qty 1

## 2019-05-31 NOTE — ED Provider Notes (Signed)
[facility] EMERGENCY DEPARTMENT Provider Note   CSN: [id number] Arrival date & time: 05/30/19  2239    History   Chief Complaint Chief Complaint  Patient presents with  . Abdominal Pain    HPI Austin Jenkins is a 33 y.o. male.   The history is provided by the patient.  Abdominal Pain He has history of alcohol abuse, pancreatitis, depression and comes in because of ongoing problems with upper abdominal pain.  Pain is in the epigastric area and right upper quadrant and radiates "to my pancreas", but does not radiate to the back or chest.  Pain is moderately severe and he rates it at 8/10.  He denies nausea or vomiting.  He denies fever or chills.  He states he has not been consuming alcohol.  He is scheduled to see a gastroenterologist in the next several weeks.  The main reason that he has come in today is he had been controlling his pain with hydrocodone-acetaminophen at home and he has run out.  Past Medical History:  Diagnosis Date  . Alcohol abuse   . Anxiety   . Depression   . IBS (irritable bowel syndrome)   . Pancreatitis     Patient Active Problem List   Diagnosis Date Noted  . Acute alcoholic pancreatitis 10/24/2018    Past Surgical History:  Procedure Laterality Date  . CHOLECYSTECTOMY          Home Medications    Prior to Admission medications   Medication Sig Start Date End Date Taking? Authorizing Provider  ALPRAZolam Prudy Feeler(XANAX) 1 MG tablet Take 1 mg by mouth 2 (two) times daily as needed for anxiety.    [provider]  buPROPion (WELLBUTRIN SR) 100 MG 12 hr tablet Take 100 mg by mouth daily. 03/28/19   [provider]  HYDROcodone-acetaminophen (NORCO/VICODIN) 5-325 MG tablet Take 1-2 tablets by mouth every 4 (four) hours as needed for moderate pain. 04/03/19   Gilda CreasePollina, Christopher J, MD  ondansetron (ZOFRAN ODT) 4 MG disintegrating tablet Take 1 tablet (4 mg total) by mouth every 8 (eight) hours as needed for nausea or  vomiting. 05/05/19   Alvira MondaySchlossman, Erin, MD  ondansetron (ZOFRAN) 4 MG tablet Take 1 tablet (4 mg total) by mouth every 6 (six) hours as needed for nausea or vomiting. 04/03/19   Pollina, Canary Brimhristopher J, MD  oxyCODONE (ROXICODONE) 5 MG immediate release tablet Take 1 tablet (5 mg total) by mouth every 4 (four) hours as needed for severe pain. 05/05/19   Alvira MondaySchlossman, Erin, MD  pantoprazole (PROTONIX) 40 MG tablet Take 1 tablet (40 mg total) by mouth daily. 04/06/19   Dione BoozeGlick, Jessilynn Taft, MD    Family History History reviewed. No pertinent family history.  Social History Social History   Tobacco Use  . Smoking status: Current Every Day Smoker    Packs/day: 0.50  . Smokeless tobacco: Current User  Substance Use Topics  . Alcohol use: Yes    Alcohol/week: 42.0 standard drinks    Types: 42 Cans of beer per week  . Drug use: Not on file     Allergies   Patient has no known allergies.   Review of Systems Review of Systems  Gastrointestinal: Positive for abdominal pain.  All other systems reviewed and are negative.    Physical Exam Updated Vital Signs BP 123/88 (BP Location: Right Arm)   Pulse 74   Resp 18   Ht 5\' 10"  (1.778 m)   Wt 62.1 kg   SpO2 100%  BMI 19.66 kg/m   Physical Exam Vitals signs and nursing note reviewed.    33 year old male, resting comfortably and in no acute distress. Vital signs are normal. Oxygen saturation is 100%, which is normal. Head is normocephalic and atraumatic. PERRLA, EOMI. Oropharynx is clear. Neck is nontender and supple without adenopathy or JVD. Back is nontender and there is no CVA tenderness. Lungs are clear without rales, wheezes, or rhonchi. Chest is nontender. Heart has regular rate and rhythm without murmur. Abdomen is soft, flat, with mild to moderate epigastric and right upper quadrant tenderness.  There is no rebound or guarding.  There are no masses or hepatosplenomegaly and peristalsis is normoactive. Extremities have no cyanosis or  edema, full range of motion is present. Skin is warm and dry without rash. Neurologic: Mental status is normal, cranial nerves are intact, there are no motor or sensory deficits.  ED Treatments / Results  Labs (all labs ordered are listed, but only abnormal results are displayed) Labs Reviewed  COMPREHENSIVE METABOLIC PANEL - Abnormal; Notable for the following components:      Result Value   Glucose, Bld 133 (*)    Calcium 8.7 (*)    Total Protein 6.4 (*)    Albumin 2.8 (*)    Alkaline Phosphatase 292 (*)    All other components within normal limits  LIPASE, BLOOD  CBC  URINALYSIS, ROUTINE W REFLEX MICROSCOPIC    Procedures Procedures  Medications Ordered in ED Medications  pantoprazole (PROTONIX) EC tablet 40 mg (has no administration in time range)  HYDROcodone-acetaminophen (NORCO/VICODIN) 5-325 MG per tablet 1 tablet (has no administration in time range)     Initial Impression / Assessment and Plan / ED Course  I have reviewed the triage vital signs and the nursing notes.  Pertinent lab results that were available during my care of the patient were reviewed by me and considered in my medical decision making (see chart for details).  Persistent upper abdominal pain in a patient with known history of pancreatitis.  Old records are reviewed confirming recent ED visit for pancreatitis.  His record on the [place] controlled substance reporting website is reviewed, and he had received a prescription for 42 tablets of hydrocodone-acetaminophen 5-325 on August 20.  Additional narcotic prescriptions were also filled on August 2 and August 6.  Labs today show normal lipase, but alkaline phosphatase level is rising.  Also, mildly elevated glucose which will need to be followed as an outpatient.  I have informed the patient that I would not prescribe narcotics for him and recommended that he contact the prescriber of his prior narcotics for ongoing medication.  He is given a dose  of hydrocodone-acetaminophen.  However, I felt it prudent to start him on a proton pump inhibitor and he is given a prescription for pantoprazole.  Final Clinical Impressions(s) / ED Diagnoses   Final diagnoses:  Upper abdominal pain  Alkaline phosphatase elevation  Elevated random blood glucose level    ED Discharge Orders         Ordered    pantoprazole (PROTONIX) 40 MG tablet  Daily     05/31/19 6578           Delora Fuel, MD 46/96/29 [phone]

## 2019-05-31 NOTE — ED Notes (Signed)
Patient verbalizes understanding of discharge instructions. Opportunity for questioning and answers were provided. Armband removed by staff, pt discharged from ED ambulatory.   

## 2019-05-31 NOTE — ED Notes (Signed)
Patient called x1-no answer 

## 2019-05-31 NOTE — ED Notes (Signed)
Attempted to call patient to place in a room; no answer. Went outside under the tunnel and up the steps outside of ED to call pt - no answer.

## 2019-05-31 NOTE — Discharge Instructions (Addendum)
Your lipase (blood test of the pancreas) was normal today.  Please start taking pantoprazole once a day.  Please get all of your narcotic prescriptions from the same provider.

## 2019-06-03 ENCOUNTER — Emergency Department (HOSPITAL_COMMUNITY)
Admission: EM | Admit: 2019-06-03 | Discharge: 2019-06-03 | Disposition: A | Discharge status: Home / Self Care | Payer: Medicaid Other | Attending: Emergency Medicine | Admitting: Emergency Medicine

## 2019-06-03 ENCOUNTER — Encounter (HOSPITAL_COMMUNITY): Payer: Self-pay | Admitting: Emergency Medicine

## 2019-06-03 ENCOUNTER — Other Ambulatory Visit: Payer: Self-pay

## 2019-06-03 ENCOUNTER — Emergency Department (HOSPITAL_COMMUNITY): Payer: Medicaid Other

## 2019-06-03 DIAGNOSIS — Z79899 Other long term (current) drug therapy: Secondary | ICD-10-CM | POA: Diagnosis not present

## 2019-06-03 DIAGNOSIS — K8689 Other specified diseases of pancreas: Secondary | ICD-10-CM | POA: Insufficient documentation

## 2019-06-03 DIAGNOSIS — R1013 Epigastric pain: Secondary | ICD-10-CM | POA: Diagnosis present

## 2019-06-03 DIAGNOSIS — F17210 Nicotine dependence, cigarettes, uncomplicated: Secondary | ICD-10-CM | POA: Insufficient documentation

## 2019-06-03 DIAGNOSIS — K859 Acute pancreatitis without necrosis or infection, unspecified: Secondary | ICD-10-CM | POA: Diagnosis not present

## 2019-06-03 LAB — CBC
HCT: 41.6 % (ref 39.0–52.0)
Hemoglobin: 13.5 g/dL (ref 13.0–17.0)
MCH: 31.7 pg (ref 26.0–34.0)
MCHC: 32.5 g/dL (ref 30.0–36.0)
MCV: 97.7 fL (ref 80.0–100.0)
Platelets: 214 10*3/uL (ref 150–400)
RBC: 4.26 MIL/uL (ref 4.22–5.81)
RDW: 13.3 % (ref 11.5–15.5)
WBC: 7.1 10*3/uL (ref 4.0–10.5)
nRBC: 0 % (ref 0.0–0.2)

## 2019-06-03 LAB — COMPREHENSIVE METABOLIC PANEL
ALT: 10 U/L (ref 0–44)
AST: 16 U/L (ref 15–41)
Albumin: 2.8 g/dL — ABNORMAL LOW (ref 3.5–5.0)
Alkaline Phosphatase: 234 U/L — ABNORMAL HIGH (ref 38–126)
Anion gap: 10 (ref 5–15)
BUN: 6 mg/dL (ref 6–20)
CO2: 24 mmol/L (ref 22–32)
Calcium: 8.7 mg/dL — ABNORMAL LOW (ref 8.9–10.3)
Chloride: 106 mmol/L (ref 98–111)
Creatinine, Ser: 0.8 mg/dL (ref 0.61–1.24)
GFR calc Af Amer: >60 mL/min (ref >60)
GFR calc non Af Amer: >60 mL/min (ref >60)
Glucose, Bld: 112 mg/dL — ABNORMAL HIGH (ref 70–99)
Potassium: 4 mmol/L (ref 3.5–5.1)
Sodium: 140 mmol/L (ref 135–145)
Total Bilirubin: 0.3 mg/dL (ref 0.3–1.2)
Total Protein: 6.4 g/dL — ABNORMAL LOW (ref 6.5–8.1)

## 2019-06-03 LAB — URINALYSIS, ROUTINE W REFLEX MICROSCOPIC
Bilirubin Urine: NEGATIVE
Glucose, UA: NEGATIVE mg/dL
Hgb urine dipstick: NEGATIVE
Ketones, ur: NEGATIVE mg/dL
Leukocytes,Ua: NEGATIVE
Nitrite: NEGATIVE
Protein, ur: NEGATIVE mg/dL
Specific Gravity, Urine: 1.031 — ABNORMAL HIGH (ref 1.005–1.030)
pH: 5 (ref 5.0–8.0)

## 2019-06-03 LAB — LIPASE, BLOOD: Lipase: 149 U/L — ABNORMAL HIGH (ref 11–51)

## 2019-06-03 MED ORDER — SODIUM CHLORIDE 0.9 % IV BOLUS
1000.0000 mL | Freq: Once | INTRAVENOUS | Status: AC
  Administered 2019-06-03: 13:00:00 1000 mL via INTRAVENOUS

## 2019-06-03 MED ORDER — OXYCODONE-ACETAMINOPHEN 5-325 MG PO TABS
2.0000 | ORAL_TABLET | Freq: Once | ORAL | Status: AC
  Administered 2019-06-03: 17:00:00 2 via ORAL
  Filled 2019-06-03: qty 2

## 2019-06-03 MED ORDER — ONDANSETRON HCL 4 MG/2ML IJ SOLN
4.0000 mg | Freq: Once | INTRAMUSCULAR | Status: AC
  Administered 2019-06-03: 13:00:00 4 mg via INTRAVENOUS
  Filled 2019-06-03: qty 2

## 2019-06-03 MED ORDER — SODIUM CHLORIDE 0.9% FLUSH
3.0000 mL | Freq: Once | INTRAVENOUS | Status: AC
  Administered 2019-06-03: 3 mL via INTRAVENOUS

## 2019-06-03 MED ORDER — IOHEXOL 300 MG/ML  SOLN
100.0000 mL | Freq: Once | INTRAMUSCULAR | Status: AC | PRN
  Administered 2019-06-03: 15:00:00 100 mL via INTRAVENOUS

## 2019-06-03 MED ORDER — HYDROMORPHONE HCL 1 MG/ML IJ SOLN
1.0000 mg | Freq: Once | INTRAMUSCULAR | Status: AC
  Administered 2019-06-03: 13:00:00 1 mg via INTRAVENOUS
  Filled 2019-06-03: qty 1

## 2019-06-03 NOTE — ED Triage Notes (Signed)
Pt reports abdominal pain for over 2 weeks, pt reports pain became severe in the last 2 hours. Pt reports nausea, denies vomiting. Pt reports unable to see GI.

## 2019-06-03 NOTE — ED Notes (Signed)
Discharge instructions discussed with Pt. Pt verbalized understanding. Pt stable and ambulatory.    

## 2019-06-03 NOTE — ED Provider Notes (Signed)
[facility] EMERGENCY DEPARTMENT Provider Note   CSN: [id number] Arrival date & time: 06/03/19  1057     History   Chief Complaint Chief Complaint  Patient presents with  . Abdominal Pain    HPI Austin Jenkins is a 33 y.o. male with history of alcohol abuse in remission, IBS, pancreatitis, anxiety, depression presenting for evaluation of acute worsening of chronic abdominal pain today.  He was first diagnosed with pancreatitis back in January of this year and has had intermittent bouts of worsening abdominal pain related to this.  He has recently established care with Dr. Cloretta Ned with gastroenterology at [facility], with plans for an endoscopy in mid September.  He tells me he awoke this morning and felt very lightheaded upon standing, almost lost consciousness but did not.  He tells me he thinks that he is dehydrated.  He had severe epigastric abdominal pain which he reports is a little unusual for his pancreatitis which is usually more right upper quadrant.  Has had some constipation, last bowel movement yesterday. He has been trying to modify his diet without relief of his symptoms.  Has also been taking Protonix. Denies fever, chills, SOB, diarrhea, or urinary symptoms.      The history is provided by the patient.    Past Medical History:  Diagnosis Date  . Alcohol abuse   . Anxiety   . Depression   . IBS (irritable bowel syndrome)   . Pancreatitis     Patient Active Problem List   Diagnosis Date Noted  . Acute alcoholic pancreatitis 10/24/2018    Past Surgical History:  Procedure Laterality Date  . CHOLECYSTECTOMY          Home Medications    Prior to Admission medications   Medication Sig Start Date End Date Taking? Authorizing Provider  ALPRAZolam Prudy Feeler) 1 MG tablet Take 1 mg by mouth 2 (two) times daily as needed for anxiety.    [provider]  buPROPion (WELLBUTRIN SR) 100 MG 12 hr tablet Take 100 mg by mouth daily.  03/28/19   [provider]  HYDROcodone-acetaminophen (NORCO/VICODIN) 5-325 MG tablet Take 1-2 tablets by mouth every 4 (four) hours as needed for moderate pain. 04/03/19   Gilda Crease, MD  ondansetron (ZOFRAN ODT) 4 MG disintegrating tablet Take 1 tablet (4 mg total) by mouth every 8 (eight) hours as needed for nausea or vomiting. 05/05/19   Alvira Monday, MD  ondansetron (ZOFRAN) 4 MG tablet Take 1 tablet (4 mg total) by mouth every 6 (six) hours as needed for nausea or vomiting. 04/03/19   Pollina, Canary Brim, MD  oxyCODONE (ROXICODONE) 5 MG immediate release tablet Take 1 tablet (5 mg total) by mouth every 4 (four) hours as needed for severe pain. 05/05/19   Alvira Monday, MD  pantoprazole (PROTONIX) 40 MG tablet Take 1 tablet (40 mg total) by mouth daily. 05/31/19   Dione Booze, MD    Family History History reviewed. No pertinent family history.  Social History Social History   Tobacco Use  . Smoking status: Current Every Day Smoker    Packs/day: 0.50  . Smokeless tobacco: Current User  Substance Use Topics  . Alcohol use: Yes    Alcohol/week: 42.0 standard drinks    Types: 42 Cans of beer per week  . Drug use: Not on file     Allergies   Patient has no known allergies.   Review of Systems Review of Systems  Constitutional: Negative for chills  and fever.  Respiratory: Negative for shortness of breath.   Cardiovascular: Negative for chest pain.  Gastrointestinal: Positive for abdominal pain and constipation.  All other systems reviewed and are negative.    Physical Exam Updated Vital Signs BP 122/84   Pulse (!) 53   Temp 98.4 F (36.9 C) (Oral)   Resp 20   SpO2 100%   Physical Exam Vitals signs and nursing note reviewed.  Constitutional:      General: He is not in acute distress.    Appearance: He is well-developed.  HENT:     Head: Normocephalic and atraumatic.  Eyes:     General:        Right eye: No discharge.        Left eye: No  discharge.     Conjunctiva/sclera: Conjunctivae normal.  Neck:     Vascular: No JVD.     Trachea: No tracheal deviation.  Cardiovascular:     Rate and Rhythm: Normal rate and regular rhythm.  Pulmonary:     Effort: Pulmonary effort is normal.     Breath sounds: Normal breath sounds.  Abdominal:     General: Abdomen is flat. There is no distension.     Palpations: Abdomen is soft.     Tenderness: There is abdominal tenderness in the right upper quadrant, epigastric area and left upper quadrant. There is no right CVA tenderness or left CVA tenderness.  Skin:    General: Skin is warm and dry.     Findings: No erythema.  Neurological:     Mental Status: He is alert.  Psychiatric:        Behavior: Behavior normal.      ED Treatments / Results  Labs (all labs ordered are listed, but only abnormal results are displayed) Labs Reviewed  LIPASE, BLOOD - Abnormal; Notable for the following components:      Result Value   Lipase 149 (*)    All other components within normal limits  COMPREHENSIVE METABOLIC PANEL - Abnormal; Notable for the following components:   Glucose, Bld 112 (*)    Calcium 8.7 (*)    Total Protein 6.4 (*)    Albumin 2.8 (*)    Alkaline Phosphatase 234 (*)    All other components within normal limits  URINALYSIS, ROUTINE W REFLEX MICROSCOPIC - Abnormal; Notable for the following components:   Specific Gravity, Urine 1.031 (*)    All other components within normal limits  CBC    EKG None  Radiology Ct Abdomen Pelvis W Contrast  Result Date: 06/03/2019 CLINICAL DATA:  Abdominal pain for 2 weeks. Nausea. History of pancreatitis. EXAM: CT ABDOMEN AND PELVIS WITH CONTRAST TECHNIQUE: Multidetector CT imaging of the abdomen and pelvis was performed using the standard protocol following bolus administration of intravenous contrast. CONTRAST:  134mL OMNIPAQUE IOHEXOL 300 MG/ML  SOLN COMPARISON:  05/05/2019. FINDINGS: Lower chest: Lung bases are clear. Heart size  normal. No pericardial or pleural effusion. Distal esophagus is unremarkable. Hepatobiliary: Subcentimeter low-attenuation lesion in the left hepatic lobe is too small to characterize. New area of organized inflammation/fluid adjacent to the caudate, measuring 4.9 x 5.9 cm (3/17). Cholecystectomy. No biliary ductal dilatation. Pancreas: Pancreas is edematous with surrounding haziness and fluid. Organized fluid collection along the pancreatic tail measures 1.9 x 5.4 cm (3/21), increased from 1.5 x 3.6 cm on 05/05/2019. Pancreas does appear to enhance normally. No ductal dilatation. Spleen: Negative. Adrenals/Urinary Tract: Adrenal glands and kidneys are unremarkable. Ureters are decompressed. Bladder is grossly  unremarkable. Stomach/Bowel: Perigastric fluid has increased in the interval. Stomach is decompressed. Small bowel, appendix and colon are unremarkable. Vascular/Lymphatic: Mild attenuation of the splenic portal confluence due to surrounding edema/fluid. Vascular structures are otherwise unremarkable. Gastrohepatic ligament lymph nodes measure up to 9 mm. Retroperitoneal lymph nodes are subcentimeter in short axis size. Peripancreatic lymph nodes measure up to 8 mm. Reproductive: Prostate is visualized. Other: Small pelvic free fluid. Small cystic structure adjacent to the ventral margin of the spleen measures 1.8 cm (3/13), decreased from 3.8 cm. Musculoskeletal: No worrisome lytic or sclerotic lesions. IMPRESSION: 1. Severe pancreatitis with extensive peripancreatic edema and fluid extending along the stomach and liver. Organized peripancreatic fluid collection along the pancreatic tail appears larger. A developing abscess cannot be excluded. Overall, findings have progressed from 05/05/2019. No definitive evidence of necrosis. 2. Associated mild attenuation of the splenic portal venous confluence. 3. Probable small pseudocyst in the left upper quadrant, decreased in size. 4. Small pelvic free fluid.  Electronically Signed   By: Leanna BattlesMelinda  Blietz M.D.   On: 06/03/2019 15:33    Procedures Procedures (including critical care time)  Medications Ordered in ED Medications  oxyCODONE-acetaminophen (PERCOCET/ROXICET) 5-325 MG per tablet 2 tablet (has no administration in time range)  sodium chloride flush (NS) 0.9 % injection 3 mL (3 mLs Intravenous Given 06/03/19 1307)  ondansetron (ZOFRAN) injection 4 mg (4 mg Intravenous Given 06/03/19 1307)  sodium chloride 0.9 % bolus 1,000 mL (0 mLs Intravenous Stopped 06/03/19 1508)  HYDROmorphone (DILAUDID) injection 1 mg (1 mg Intravenous Given 06/03/19 1307)  iohexol (OMNIPAQUE) 300 MG/ML solution 100 mL (100 mLs Intravenous Contrast Given 06/03/19 1503)     Initial Impression / Assessment and Plan / ED Course  I have reviewed the triage vital signs and the nursing notes.  Pertinent labs & imaging results that were available during my care of the patient were reviewed by me and considered in my medical decision making (see chart for details).        Patient with history of pancreatitis presenting for worsening symptoms.  He is afebrile, initially tachycardic but vital signs otherwise stable and his heart rate normalized on subsequent re-evaluations.  He is overall resting quite comfortably with no peritoneal signs on examination of the abdomen.  He is nontoxic in appearance and has had no constitutional symptoms but does feel a little dehydrated.  We will give IV fluids, will recheck blood work and CT scan and reassess.  Lab work reviewed by me shows no leukocytosis, no anemia, no metabolic derangements.  No renal insufficiency.  His lipase today is elevated compared to most recent values but not quite as high as it has been previously.  His UA suggest mild dehydration but no evidence of UTI or nephrolithiasis.   CT scan today shows severe pancreatitis with progression of a peripancreatic fluid collection.  4:29 PM CONSULT: Spoke with Francia GreavesSarah Gibbon  with gastroenterology who has reviewed the patient's images.  She states that the images do not suggest an abscess and the patient has had no fevers, no leukocytosis to suggest infectious process.  She states there is no indication for any procedural intervention at this time and the patient has close GI follow-up scheduled this week as well as an EGD scheduled later this month.  She notes that it would be reasonable to potentially admit the patient for symptomatic management.  On my assessment the patient is resting comfortably, he is requesting oral pain medicines.  He has been tolerating p.o. food and  fluids at home without difficulty.  He states that if he is not going to have any procedures he would like to go home.  He has follow-up with his gastroenterologist in the next 2 days.  He will continue to take his home medications and manage a bland diet.  We discussed strict ED return precautions including development of fever, persistent vomiting, or worsening pain.  Patient verbalized understanding of and agreement with plan and patient stable for discharge home at this time.  Patient was seen and evaluated Dr. Adela LankFloyd who agrees with assessment and plan at this time.  Final Clinical Impressions(s) / ED Diagnoses   Final diagnoses:  Pancreatitis, recurrent  Peripancreatic fluid collection    ED Discharge Orders    None       Jeanie SewerFawze, Maliea Grandmaison A, PA-C 06/03/19 1710    Melene PlanFloyd, Dan, DO 06/03/19 1900

## 2019-06-03 NOTE — ED Notes (Signed)
Got patient on the monitor patient is resting with call bell in reach  ?

## 2019-06-03 NOTE — Discharge Instructions (Addendum)
Your pancreatitis is worsening.  Continue to take your home medications as prescribed.  Continue to keep a bland diet.  Drink plenty of fluids and get plenty of rest.  You can apply lidocaine patches to areas of pain.  Follow-up with your gastroenterologist as scheduled.  They may want to move your EGD up further.  I have printed out the results of your CT scan, they may want to see the images so they can request them from medical records here.  Return to the emergency department immediately if any concerning signs or symptoms develop such as fever, persistent vomiting, or worsening pain.

## 2019-06-10 ENCOUNTER — Encounter (HOSPITAL_COMMUNITY): Payer: Self-pay | Admitting: Emergency Medicine

## 2019-06-10 ENCOUNTER — Emergency Department (HOSPITAL_COMMUNITY)
Admission: EM | Admit: 2019-06-10 | Discharge: 2019-06-11 | Disposition: A | Discharge status: Home / Self Care | Payer: Medicaid Other | Attending: Emergency Medicine | Admitting: Emergency Medicine

## 2019-06-10 ENCOUNTER — Other Ambulatory Visit: Payer: Self-pay

## 2019-06-10 DIAGNOSIS — K858 Other acute pancreatitis without necrosis or infection: Secondary | ICD-10-CM | POA: Diagnosis not present

## 2019-06-10 DIAGNOSIS — F17210 Nicotine dependence, cigarettes, uncomplicated: Secondary | ICD-10-CM | POA: Diagnosis not present

## 2019-06-10 DIAGNOSIS — R112 Nausea with vomiting, unspecified: Secondary | ICD-10-CM | POA: Insufficient documentation

## 2019-06-10 DIAGNOSIS — R1010 Upper abdominal pain, unspecified: Secondary | ICD-10-CM | POA: Diagnosis present

## 2019-06-10 LAB — COMPREHENSIVE METABOLIC PANEL
ALT: 10 U/L (ref 0–44)
AST: 15 U/L (ref 15–41)
Albumin: 2.9 g/dL — ABNORMAL LOW (ref 3.5–5.0)
Alkaline Phosphatase: 192 U/L — ABNORMAL HIGH (ref 38–126)
Anion gap: 9 (ref 5–15)
BUN: 8 mg/dL (ref 6–20)
CO2: 24 mmol/L (ref 22–32)
Calcium: 8.7 mg/dL — ABNORMAL LOW (ref 8.9–10.3)
Chloride: 107 mmol/L (ref 98–111)
Creatinine, Ser: 0.84 mg/dL (ref 0.61–1.24)
GFR calc Af Amer: >60 mL/min (ref >60)
GFR calc non Af Amer: >60 mL/min (ref >60)
Glucose, Bld: 145 mg/dL — ABNORMAL HIGH (ref 70–99)
Potassium: 4.2 mmol/L (ref 3.5–5.1)
Sodium: 140 mmol/L (ref 135–145)
Total Bilirubin: 0.4 mg/dL (ref 0.3–1.2)
Total Protein: 6.4 g/dL — ABNORMAL LOW (ref 6.5–8.1)

## 2019-06-10 LAB — CBC
HCT: 43.4 % (ref 39.0–52.0)
Hemoglobin: 14.3 g/dL (ref 13.0–17.0)
MCH: 31.3 pg (ref 26.0–34.0)
MCHC: 32.9 g/dL (ref 30.0–36.0)
MCV: 95 fL (ref 80.0–100.0)
Platelets: 254 10*3/uL (ref 150–400)
RBC: 4.57 MIL/uL (ref 4.22–5.81)
RDW: 13.2 % (ref 11.5–15.5)
WBC: 6.5 10*3/uL (ref 4.0–10.5)
nRBC: 0 % (ref 0.0–0.2)

## 2019-06-10 LAB — URINALYSIS, ROUTINE W REFLEX MICROSCOPIC
Bilirubin Urine: NEGATIVE
Glucose, UA: NEGATIVE mg/dL
Hgb urine dipstick: NEGATIVE
Ketones, ur: NEGATIVE mg/dL
Leukocytes,Ua: NEGATIVE
Nitrite: NEGATIVE
Protein, ur: NEGATIVE mg/dL
Specific Gravity, Urine: 1.026 (ref 1.005–1.030)
pH: 6 (ref 5.0–8.0)

## 2019-06-10 LAB — LIPASE, BLOOD: Lipase: 57 U/L — ABNORMAL HIGH (ref 11–51)

## 2019-06-10 NOTE — ED Triage Notes (Signed)
Patient reports pancreatitis flare up this evening with right abdominal pain , nausea and diarrhea x2. Denies fever or chills.

## 2019-06-11 ENCOUNTER — Other Ambulatory Visit: Payer: Self-pay

## 2019-06-11 ENCOUNTER — Emergency Department (HOSPITAL_COMMUNITY)
Admission: EM | Admit: 2019-06-11 | Discharge: 2019-06-12 | Disposition: A | Discharge status: Home / Self Care | Payer: Medicaid Other | Source: Home / Self Care | Attending: Emergency Medicine | Admitting: Emergency Medicine

## 2019-06-11 ENCOUNTER — Encounter (HOSPITAL_COMMUNITY): Payer: Self-pay | Admitting: Emergency Medicine

## 2019-06-11 DIAGNOSIS — F17210 Nicotine dependence, cigarettes, uncomplicated: Secondary | ICD-10-CM | POA: Insufficient documentation

## 2019-06-11 DIAGNOSIS — K863 Pseudocyst of pancreas: Secondary | ICD-10-CM | POA: Insufficient documentation

## 2019-06-11 DIAGNOSIS — Z5329 Procedure and treatment not carried out because of patient's decision for other reasons: Secondary | ICD-10-CM

## 2019-06-11 DIAGNOSIS — R10817 Generalized abdominal tenderness: Secondary | ICD-10-CM | POA: Insufficient documentation

## 2019-06-11 DIAGNOSIS — R1013 Epigastric pain: Secondary | ICD-10-CM | POA: Insufficient documentation

## 2019-06-11 DIAGNOSIS — Z79899 Other long term (current) drug therapy: Secondary | ICD-10-CM | POA: Insufficient documentation

## 2019-06-11 DIAGNOSIS — Z5320 Procedure and treatment not carried out because of patient's decision for unspecified reasons: Secondary | ICD-10-CM | POA: Insufficient documentation

## 2019-06-11 MED ORDER — HYDROMORPHONE HCL 1 MG/ML IJ SOLN
1.0000 mg | Freq: Once | INTRAMUSCULAR | Status: AC
  Administered 2019-06-11: 03:00:00 1 mg via INTRAVENOUS
  Filled 2019-06-11: qty 1

## 2019-06-11 MED ORDER — ONDANSETRON HCL 4 MG/2ML IJ SOLN
4.0000 mg | Freq: Once | INTRAMUSCULAR | Status: AC
  Administered 2019-06-11: 03:00:00 4 mg via INTRAVENOUS
  Filled 2019-06-11: qty 2

## 2019-06-11 MED ORDER — SODIUM CHLORIDE 0.9 % IV BOLUS (SEPSIS)
1000.0000 mL | Freq: Once | INTRAVENOUS | Status: AC
  Administered 2019-06-11: 02:00:00 1000 mL via INTRAVENOUS

## 2019-06-11 MED ORDER — SODIUM CHLORIDE 0.9% FLUSH
3.0000 mL | Freq: Once | INTRAVENOUS | Status: AC
  Administered 2019-06-12: 3 mL via INTRAVENOUS

## 2019-06-11 MED ORDER — SODIUM CHLORIDE 0.9 % IV SOLN
INTRAVENOUS | Status: DC
  Administered 2019-06-11: 02:00:00 via INTRAVENOUS

## 2019-06-11 MED ORDER — HYDROMORPHONE HCL 1 MG/ML IJ SOLN
1.0000 mg | Freq: Once | INTRAMUSCULAR | Status: AC
  Administered 2019-06-11: 1 mg via INTRAVENOUS
  Filled 2019-06-11: qty 1

## 2019-06-11 MED ORDER — ONDANSETRON HCL 4 MG/2ML IJ SOLN
4.0000 mg | Freq: Once | INTRAMUSCULAR | Status: AC
  Administered 2019-06-11: 4 mg via INTRAVENOUS
  Filled 2019-06-11: qty 2

## 2019-06-11 NOTE — ED Provider Notes (Signed)
TIME SEEN: 1:06 AM  CHIEF COMPLAINT: Abdominal pain  HPI: Patient is a 33 year old male with history of alcohol induced pancreatitis who presents to the emergency department with upper abdominal pain for the past day.  Patient did have nausea and one episode of vomiting at 4 PM yesterday.  No diarrhea.  No fever.  No previous abdominal surgery.  States he is followed by GI as an outpatient.  Received 28 tablets of oxycodone on 06/04/2019 from [facility] but states that he was supposed to get another prescription for 56 tablets tomorrow but will not get it until September 10.  States he no longer has pain medicine at home.  States he is status post cholecystectomy.  ROS: See HPI Constitutional: no fever  Eyes: no drainage  ENT: no runny nose   Cardiovascular:  no chest pain  Resp: no SOB  GI: no vomiting GU: no dysuria Integumentary: no rash  Allergy: no hives  Musculoskeletal: no leg swelling  Neurological: no slurred speech ROS otherwise negative  PAST MEDICAL HISTORY/PAST SURGICAL HISTORY:  Past Medical History:  Diagnosis Date  . Alcohol abuse   . Anxiety   . Depression   . IBS (irritable bowel syndrome)   . Pancreatitis     MEDICATIONS:  Prior to Admission medications   Medication Sig Start Date End Date Taking? Authorizing Provider  ALPRAZolam Duanne Moron) 1 MG tablet Take 1 mg by mouth 2 (two) times daily as needed for anxiety.    [provider]  buPROPion (WELLBUTRIN SR) 100 MG 12 hr tablet Take 100 mg by mouth daily. 03/28/19   [provider]  HYDROcodone-acetaminophen (NORCO/VICODIN) 5-325 MG tablet Take 1-2 tablets by mouth every 4 (four) hours as needed for moderate pain. 04/03/19   Orpah Greek, MD  ondansetron (ZOFRAN ODT) 4 MG disintegrating tablet Take 1 tablet (4 mg total) by mouth every 8 (eight) hours as needed for nausea or vomiting. 05/05/19   Gareth Morgan, MD  ondansetron (ZOFRAN) 4 MG tablet Take 1 tablet (4 mg total) by  mouth every 6 (six) hours as needed for nausea or vomiting. 04/03/19   Pollina, Gwenyth Allegra, MD  oxyCODONE (ROXICODONE) 5 MG immediate release tablet Take 1 tablet (5 mg total) by mouth every 4 (four) hours as needed for severe pain. 05/05/19   Gareth Morgan, MD  pantoprazole (PROTONIX) 40 MG tablet Take 1 tablet (40 mg total) by mouth daily. 3/57/01   Delora Fuel, MD    ALLERGIES:  No Known Allergies  SOCIAL HISTORY:  Social History   Tobacco Use  . Smoking status: Current Every Day Smoker    Packs/day: 0.50  . Smokeless tobacco: Current User  Substance Use Topics  . Alcohol use: Yes    Alcohol/week: 42.0 standard drinks    Types: 42 Cans of beer per week    FAMILY HISTORY: No family history on file.  EXAM: BP 120/78 (BP Location: Right Arm)   Pulse 81   Temp 98.3 F (36.8 C) (Oral)   Resp 18   SpO2 100%  CONSTITUTIONAL: Alert and oriented and responds appropriately to questions. Well-appearing; well-nourished HEAD: Normocephalic EYES: Conjunctivae clear, pupils appear equal, EOMI ENT: normal nose; moist mucous membranes NECK: Supple, no meningismus, no nuchal rigidity, no LAD  CARD: RRR; S1 and S2 appreciated; no murmurs, no clicks, no rubs, no gallops RESP: Normal chest excursion without splinting or tachypnea; breath sounds clear and equal bilaterally; no wheezes, no rhonchi, no rales, no hypoxia or respiratory distress, speaking  full sentences ABD/GI: Normal bowel sounds; non-distended; soft, diffusely tender throughout the abdomen but especially in the upper abdomen, no rebound, no guarding, no peritoneal signs, no hepatosplenomegaly BACK:  The back appears normal and is non-tender to palpation, there is no CVA tenderness EXT: Normal ROM in all joints; non-tender to palpation; no edema; normal capillary refill; no cyanosis, no calf tenderness or swelling    SKIN: Normal color for age and race; warm; no rash NEURO: Moves all extremities equally PSYCH: The patient's  mood and manner are appropriate. Grooming and personal hygiene are appropriate.  MEDICAL DECISION MAKING: Patient here with complaints of upper abdominal pain similar to his pancreatitis.  Denies any vomiting.  Vital signs normal.  He does not have a surgical abdomen today.  Last CT scan on 06/03/2019 showed severe pancreatitis with extensive peripancreatic edema and fluid extending along the stomach and liver with organized peripancreatic fluid collection along the pancreatic tail that is getting larger compared to CT on 05/05/2019.  No signs of necrosis.  Patient would prefer to avoid CT imaging again today which I feel is reasonable given his known history of pancreatitis.  He states his last alcoholic beverage was 3 months ago.  Lipase today only mildly elevated at 57.  Otherwise blood work is reassuring.  Will give IV fluids, Zofran, Dilaudid and reassess.  He does have high overdose risk score on the [employer] database of 610.  It appears he receives multiple narcotic prescriptions from different providers.  He states he is supposed to receive 56 tablets of oxycodone by [facility] on the 10th.  I have advised him to follow-up with his doctor in the morning to see if this prescription can be filled early.  ED PROGRESS: Patient reports pain and nausea are improving but not completely gone.  Will give second dose of IV Dilaudid and Zofran.  He is getting IV hydration currently.   5:00 AM  Pt reports pain is now 1 or 2/10.  Nausea has resolved.  I will discharge patient home per his request.  He will follow-up with his [facility] care doctor today [facility] for [facility].  He is very comfortable with this plan.  States he has plenty of antiemetics at home.  Discussed return precautions.   At this time, I do not feel there is any life-threatening condition present. I have reviewed and discussed all results (EKG, imaging, lab, urine as appropriate) and exam  findings with patient/family. I have reviewed nursing notes and appropriate previous records.  I feel the patient is safe to be discharged home without further emergent workup and can continue workup as an outpatient as needed. Discussed usual and customary return precautions. Patient/family verbalize understanding and are comfortable with this plan.  Outpatient follow-up has been provided as needed. All questions have been answered.     Camora Tremain, Layla MawKristen N, DO 06/11/19 0500

## 2019-06-11 NOTE — ED Triage Notes (Signed)
PT c/o generalized abdominal pain with nausea/vomiting x 1 day. Hx pancreatitis.

## 2019-06-12 ENCOUNTER — Emergency Department (HOSPITAL_COMMUNITY): Payer: Medicaid Other

## 2019-06-12 LAB — COMPREHENSIVE METABOLIC PANEL
ALT: 9 U/L (ref 0–44)
AST: 19 U/L (ref 15–41)
Albumin: 2.8 g/dL — ABNORMAL LOW (ref 3.5–5.0)
Alkaline Phosphatase: 175 U/L — ABNORMAL HIGH (ref 38–126)
Anion gap: 6 (ref 5–15)
BUN: 7 mg/dL (ref 6–20)
CO2: 25 mmol/L (ref 22–32)
Calcium: 8.4 mg/dL — ABNORMAL LOW (ref 8.9–10.3)
Chloride: 103 mmol/L (ref 98–111)
Creatinine, Ser: 0.78 mg/dL (ref 0.61–1.24)
GFR calc Af Amer: >60 mL/min (ref >60)
GFR calc non Af Amer: >60 mL/min (ref >60)
Glucose, Bld: 121 mg/dL — ABNORMAL HIGH (ref 70–99)
Potassium: 4.1 mmol/L (ref 3.5–5.1)
Sodium: 134 mmol/L — ABNORMAL LOW (ref 135–145)
Total Bilirubin: 0.8 mg/dL (ref 0.3–1.2)
Total Protein: 6.3 g/dL — ABNORMAL LOW (ref 6.5–8.1)

## 2019-06-12 LAB — CBC
HCT: 42.3 % (ref 39.0–52.0)
Hemoglobin: 13.9 g/dL (ref 13.0–17.0)
MCH: 31 pg (ref 26.0–34.0)
MCHC: 32.9 g/dL (ref 30.0–36.0)
MCV: 94.4 fL (ref 80.0–100.0)
Platelets: 238 10*3/uL (ref 150–400)
RBC: 4.48 MIL/uL (ref 4.22–5.81)
RDW: 13.2 % (ref 11.5–15.5)
WBC: 8.3 10*3/uL (ref 4.0–10.5)
nRBC: 0 % (ref 0.0–0.2)

## 2019-06-12 LAB — URINALYSIS, ROUTINE W REFLEX MICROSCOPIC
Bilirubin Urine: NEGATIVE
Glucose, UA: NEGATIVE mg/dL
Hgb urine dipstick: NEGATIVE
Ketones, ur: NEGATIVE mg/dL
Leukocytes,Ua: NEGATIVE
Nitrite: NEGATIVE
Protein, ur: NEGATIVE mg/dL
Specific Gravity, Urine: 1.025 (ref 1.005–1.030)
pH: 5 (ref 5.0–8.0)

## 2019-06-12 LAB — LIPASE, BLOOD: Lipase: 84 U/L — ABNORMAL HIGH (ref 11–51)

## 2019-06-12 MED ORDER — ONDANSETRON HCL 4 MG/2ML IJ SOLN
4.0000 mg | Freq: Once | INTRAMUSCULAR | Status: AC
  Administered 2019-06-12: 4 mg via INTRAVENOUS
  Filled 2019-06-12: qty 2

## 2019-06-12 MED ORDER — HYDROMORPHONE HCL 1 MG/ML IJ SOLN: 1.0000 mg | Freq: Once | INTRAMUSCULAR | Status: DC

## 2019-06-12 MED ORDER — OXYCODONE HCL 5 MG PO TABS: 5.0000 mg | ORAL_TABLET | ORAL | 0 refills | Status: AC | PRN

## 2019-06-12 MED ORDER — SODIUM CHLORIDE 0.9 % IV BOLUS
1000.0000 mL | Freq: Once | INTRAVENOUS | Status: AC
  Administered 2019-06-12: 1000 mL via INTRAVENOUS

## 2019-06-12 MED ORDER — HYDROMORPHONE HCL 1 MG/ML IJ SOLN: 0.5000 mg | Freq: Once | INTRAMUSCULAR | Status: DC

## 2019-06-12 MED ORDER — IOHEXOL 300 MG/ML  SOLN
100.0000 mL | Freq: Once | INTRAMUSCULAR | Status: AC | PRN
  Administered 2019-06-12: 07:00:00 100 mL via INTRAVENOUS

## 2019-06-12 MED ORDER — HYDROMORPHONE HCL 1 MG/ML IJ SOLN
1.0000 mg | Freq: Once | INTRAMUSCULAR | Status: AC
  Administered 2019-06-12: 07:00:00 1 mg via INTRAVENOUS
  Filled 2019-06-12: qty 1

## 2019-06-12 NOTE — ED Notes (Signed)
Patient transported to CT 

## 2019-06-12 NOTE — ED Provider Notes (Signed)
0730: Pt handed off to me by previous EDPA at shift change. See previous note for full H&P.   33 year old with history of alcohol induced pancreatitis, recurrent abdominal pain ER visits here for nausea, vomiting and epigastric abdominal pain.  Seen in the ER yesterday.  Plan is for CT, reassess. Physical Exam  BP 122/90 (BP Location: Right Arm)   Pulse 86   Temp 98.9 F (37.2 C) (Oral)   Resp 18   Ht _0  (1.778 m)   Wt 61.2 kg   SpO2 100%   BMI 19.37 kg/m   Physical Exam Constitutional:      Appearance: He is well-developed.  HENT:     Head: Normocephalic.     Nose: Nose normal.  Eyes:     General: Lids are normal.  Neck:     Musculoskeletal: Normal range of motion.  Cardiovascular:     Rate and Rhythm: Normal rate.  Pulmonary:     Effort: Pulmonary effort is normal. No respiratory distress.  Musculoskeletal: Normal range of motion.  Neurological:     Mental Status: He is alert.  Psychiatric:        Behavior: Behavior normal.    ED Course/Procedures   Clinical Course as of Jun 11 920  Wed Jun 12, 2019  0800 Pancreas: Peripancreatic inflammatory changes are again identified with somewhat ovoid fluid collection along the anterior margin of the pancreas measuring 5.7 x 3.1 cm. This is slightly increased when compared with the prior exam and likely represents a developing pseudocyst. The fluid attenuation collection adjacent to the caudate lobe is smaller in appearance. No definitive pancreatic necrosis is noted. Mild attenuation of the splenic portal confluence is again seen related to the peripancreatic edema.  Spleen: Spleen is within normal limits. A small fluid collection anterior to the spleen seen on the prior exam has resolved.  Stomach/Bowel: No obstructive or inflammatory changes of the colon are seen. The appendix is within normal limits. No small bowel abnormality is noted. The stomach is distended with fluid and there is an irregular fluid collection  along the posterior margin of the stomach which appears slightly larger than that seen on the prior exam and also likely related to the peripancreatic inflammatory change and possible developing pseudocyst.  Vascular/Lymphatic: Aortic atherosclerosis. Small reactive lymph nodes are identified similar to that seen on the prior exam.  Other: Free fluid is noted within the pelvis increased when compared with the prior exam also related to the underlying pancreatitis.  CT ABDOMEN PELVIS W CONTRAST [CG]  0849 Nasojej tube bypass pancreas for rest per Shahid   [CG]  0921 Lipase(!): 84 [CG]    Clinical Course User Index [CG] Kinnie Feil, PA-C    Procedures  MDM   [phone]: ER work-up reviewed by me remarkable for lipase 84 which appears to be his baseline, glucose is 121, alk phos is 175.  LDH not obtained initially.  No leukocytosis.  CT A/P reviewed by me and radiology shows severe peripancreatic inflammatory process, increase fluid collection suspicious for pseudocyst forming.  No pancreatic necrosis.  Fluid collection around the spleen, reactive lymph nodes improved from previous.  Fluid collection on posterior stomach is slightly larger.  I reevaluated patient again and he has persistent but improving epigastric tenderness with guarding.  No vital sign abnormalities.  I spoke with patient's gastroenterologist Dr. Alan Ripper at [place] who recommends admission and GI consultation and.  States patient's pancreatitis is not responding to outpatient management and  is only worsening.  Her plan was to recommend nasojejunal tube to bypass the pancreas for pancreas rest and [facility] GI referral if her symptoms persisted.  He will benefit from admission and GI evaluation today.  I discussed this plan with patient twice.  He twice declined admission stating he needs to take care of his son while his wife goes to school.  Lengthy discussion about benefit of admission and risk of leaving  [place] including worsening illness, infection, life-threatening process and death.  He assumes risks and appears to be sound of mind and understand them.  I will discharge him with 1 days worth of oxycodone as he is due to refill his oxycodone tomorrow morning.  He has antiemetics at home.  Recommended clear liquid diet and close follow-up with his GI doctor.  Strict return precautions given.  Patient agrees with this.       Kinnie Feil, PA-C 06/12/19 1982    Charlesetta Shanks, MD 06/14/19 [phone]

## 2019-06-12 NOTE — Discharge Instructions (Addendum)
CT scan showed worsening pancreatitis and increased fluid collection suggesting there is a developing pseudocyst on your pancreas  I called your gastroenterologist and she recommended admission  You declined this and left AGAINST MEDICAL ADVICE assuming risks of worsening illness, infection, life threatening process and death  Call your gastroenterologist as soon as possible for close follow up

## 2019-06-12 NOTE — ED Provider Notes (Addendum)
[facility] EM[facility]ENC[facility] Provider Note   CSN: [id number] Arrival date & time: 06/11/19  2349     History   Chief Complaint Chief Complaint  Patient presents with  . Abdominal Pain    HPI Austin Jenkins is a 33 y.o. male. Patient is a 33 year old male with history of alcohol induced pancreatitis who presents for epigastric abdominal pain for the past 2 days.  Patient states he was seen yesterday in emergency department and given Zofran, pain medication and fluids.   Patient states his pain is controlled by his primary care provider.  He states that he is pain medication but will have his medications refilled soon.  Patient says he is vomited several times since his ED visit yesterday and that the pain has worsened over the past 3 hours and is sharp and constant now.  Patient denies any amelioration of symptoms with position.    HPI  Past Medical History:  Diagnosis Date  . Alcohol abuse   . Anxiety   . Depression   . IBS (irritable bowel syndrome)   . Pancreatitis     Patient Active Problem List   Diagnosis Date Noted  . Acute alcoholic pancreatitis 10/24/2018    Past Surgical History:  Procedure Laterality Date  . CHOLECYSTECTOMY          Home Medications    Prior to Admission medications   Medication Sig Start Date End Date Taking? Authorizing Provider  ALPRAZolam Prudy Feeler(XANAX) 1 MG tablet Take 1 mg by mouth 2 (two) times daily as needed for anxiety.    [provider]  buPROPion (WELLBUTRIN SR) 100 MG 12 hr tablet Take 100 mg by mouth daily. 03/28/19   [provider]  HYDROcodone-acetaminophen (NORCO/VICODIN) 5-325 MG tablet Take 1-2 tablets by mouth every 4 (four) hours as needed for moderate pain. Patient not taking: Reported on 06/11/2019 04/03/19   Gilda CreasePollina, Christopher J, MD  ondansetron (ZOFRAN ODT) 4 MG disintegrating tablet Take 1 tablet (4 mg total) by mouth every 8 (eight) hours as needed for nausea or vomiting. 05/05/19    Alvira MondaySchlossman, Erin, MD  ondansetron (ZOFRAN) 4 MG tablet Take 1 tablet (4 mg total) by mouth every 6 (six) hours as needed for nausea or vomiting. 04/03/19   Pollina, Canary Brimhristopher J, MD  oxyCODONE (ROXICODONE) 5 MG immediate release tablet Take 1 tablet (5 mg total) by mouth every 4 (four) hours as needed for severe pain. 05/05/19   Alvira MondaySchlossman, Erin, MD  pantoprazole (PROTONIX) 40 MG tablet Take 1 tablet (40 mg total) by mouth daily. 05/31/19   Dione BoozeGlick, David, MD    Family History No family history on file.  Social History Social History   Tobacco Use  . Smoking status: Current Every Day Smoker    Packs/day: 0.50  . Smokeless tobacco: Current User  Substance Use Topics  . Alcohol use: Yes    Alcohol/week: 42.0 standard drinks    Types: 42 Cans of beer per week  . Drug use: Not on file     Allergies   Patient has no known allergies.   Review of Systems Review of Systems  Constitutional: Negative for chills and fever.  HENT: Negative for congestion, sinus pressure and sinus pain.   Eyes: Negative for pain.  Respiratory: Negative for cough and shortness of breath.   Cardiovascular: Negative for chest pain and leg swelling.  Gastrointestinal: Positive for abdominal pain, nausea and vomiting.  Genitourinary: Negative for dysuria.  Musculoskeletal: Negative for myalgias.  Skin: Negative  for rash.  Neurological: Positive for dizziness. Negative for headaches.     Physical Exam Updated Vital Signs BP (!) 115/93 (BP Location: Right Arm)   Pulse (!) 59   Temp 98.9 F (37.2 C) (Oral)   Resp 18   Ht 5\' 10"  (1.778 m)   Wt 61.2 kg   SpO2 98%   BMI 19.37 kg/m   Physical Exam Vitals signs and nursing note reviewed.  Constitutional:      Appearance: He is not ill-appearing.  HENT:     Head: Normocephalic and atraumatic.  Eyes:     General: No scleral icterus. Neck:     Musculoskeletal: No neck rigidity.  Cardiovascular:     Rate and Rhythm: Normal rate and regular rhythm.      Pulses: Normal pulses.     Heart sounds: Normal heart sounds.  Pulmonary:     Effort: Pulmonary effort is normal.     Breath sounds: Normal breath sounds.  Abdominal:     General: Abdomen is flat. Bowel sounds are increased. There is no distension.     Tenderness: There is generalized abdominal tenderness and tenderness in the epigastric area and periumbilical area.     Comments: Difficult to assess presence of guarding and rebound due to patient voluntarily guarding during abdominal exam.  Musculoskeletal:     Right lower leg: No edema.     Left lower leg: No edema.  Skin:    General: Skin is warm and dry.     Capillary Refill: Capillary refill takes less than 2 seconds.  Neurological:     Mental Status: He is alert. Mental status is at baseline.  Psychiatric:        Behavior: Behavior normal.      ED Treatments / Results  Labs (all labs ordered are listed, but only abnormal results are displayed) Labs Reviewed  LIPASE, BLOOD - Abnormal; Notable for the following components:      Result Value   Lipase 84 (*)    All other components within normal limits  COMPREHENSIVE METABOLIC PANEL - Abnormal; Notable for the following components:   Sodium 134 (*)    Glucose, Bld 121 (*)    Calcium 8.4 (*)    Total Protein 6.3 (*)    Albumin 2.8 (*)    Alkaline Phosphatase 175 (*)    All other components within normal limits  CBC  URINALYSIS, ROUTINE W REFLEX MICROSCOPIC    EKG None  Radiology No results found.  Procedures Procedures (including critical care time)  Medications Ordered in ED Medications  sodium chloride flush (NS) 0.9 % injection 3 mL (has no administration in time range)     Initial Impression / Assessment and Plan / ED Course  I have reviewed the triage vital signs and the nursing notes.  Pertinent labs & imaging results that were available during my care of the patient were reviewed by me and considered in my medical decision making (see chart for  details).   Patient is a 33 year old male with a history of alcohol use disorder and chronic pancreatitis.  Patient was seen yesterday and treated with Zofran, IV fluids and Dilaudid but requested no CT scan at that time due to insurance coverage.  Recommended repeat CT scan since patient is in worse pain than he was at time of discharge yesterday.  Last CT conducted 8/31 showed severe pancreatitis with edema and fluid along stomach and liver and CT impression noted the possibility of a developing abscess and  other concerning findings that have progressed from prior CT scan conducted 05/05/2019.  Patient willing to have repeat CT done today.  On review of PDMP, patient was prescribed 7 days of hydrocodone 9/1 consistent with patient history. However, patient will need to follow up with PCP for management of pain medication.    Patient will be given Zofran, IV fluids, and dilaudid. Pending CT results.   Clinical Course as of Jun 16 1913  Wed Jun 12, 2019  0800 Pancreas: Peripancreatic inflammatory changes are again identified with somewhat ovoid fluid collection along the anterior margin of the pancreas measuring 5.7 x 3.1 cm. This is slightly increased when compared with the prior exam and likely represents a developing pseudocyst. The fluid attenuation collection adjacent to the caudate lobe is smaller in appearance. No definitive pancreatic necrosis is noted. Mild attenuation of the splenic portal confluence is again seen related to the peripancreatic edema.  Spleen: Spleen is within normal limits. A small fluid collection anterior to the spleen seen on the prior exam has resolved.  Stomach/Bowel: No obstructive or inflammatory changes of the colon are seen. The appendix is within normal limits. No small bowel abnormality is noted. The stomach is distended with fluid and there is an irregular fluid collection along the posterior margin of the stomach which appears slightly larger than that  seen on the prior exam and also likely related to the peripancreatic inflammatory change and possible developing pseudocyst.  Vascular/Lymphatic: Aortic atherosclerosis. Small reactive lymph nodes are identified similar to that seen on the prior exam.  Other: Free fluid is noted within the pelvis increased when compared with the prior exam also related to the underlying pancreatitis.  CT ABDOMEN PELVIS W CONTRAST [CG]  0849 Nasojej tube bypass pancreas for rest per Shahid   [CG]  1749 Lipase(!): 84 [CG]    Clinical Course User Index [CG] Kinnie Feil, PA-C        7:16 AM Patient handoff given to [facility].   Final Clinical Impressions(s) / ED Diagnoses   Final diagnoses:  None    ED Discharge Orders    None       Tedd Sias, [place] 06/12/19 0716    Tedd Sias, PA 44/96/75 9163    Delora Fuel, MD 84/66/59 1304

## 2019-06-12 NOTE — ED Notes (Signed)
Pt discharged AMA. Provider discussed with pt importance of remaining at the hospital. Pt still wanting to leave AMA. Pt discharged.

## 2019-06-20 ENCOUNTER — Encounter (HOSPITAL_COMMUNITY): Payer: Self-pay | Admitting: Emergency Medicine

## 2019-06-20 ENCOUNTER — Emergency Department (HOSPITAL_COMMUNITY)
Admission: EM | Admit: 2019-06-20 | Discharge: 2019-06-20 | Discharge status: Against Medical Advice | Payer: Medicaid Other | Attending: Emergency Medicine | Admitting: Emergency Medicine

## 2019-06-20 ENCOUNTER — Other Ambulatory Visit: Payer: Self-pay

## 2019-06-20 DIAGNOSIS — R1010 Upper abdominal pain, unspecified: Secondary | ICD-10-CM | POA: Diagnosis present

## 2019-06-20 DIAGNOSIS — Z5321 Procedure and treatment not carried out due to patient leaving prior to being seen by health care provider: Secondary | ICD-10-CM | POA: Diagnosis not present

## 2019-06-20 LAB — URINALYSIS, ROUTINE W REFLEX MICROSCOPIC
Glucose, UA: NEGATIVE mg/dL
Hgb urine dipstick: NEGATIVE
Ketones, ur: 80 mg/dL — AB
Leukocytes,Ua: NEGATIVE
Nitrite: NEGATIVE
Protein, ur: 30 mg/dL — AB
Specific Gravity, Urine: 1.032 — ABNORMAL HIGH (ref 1.005–1.030)
pH: 6 (ref 5.0–8.0)

## 2019-06-20 LAB — COMPREHENSIVE METABOLIC PANEL
ALT: 7 U/L (ref 0–44)
AST: 14 U/L — ABNORMAL LOW (ref 15–41)
Albumin: 2.9 g/dL — ABNORMAL LOW (ref 3.5–5.0)
Alkaline Phosphatase: 177 U/L — ABNORMAL HIGH (ref 38–126)
Anion gap: 10 (ref 5–15)
BUN: 10 mg/dL (ref 6–20)
CO2: 23 mmol/L (ref 22–32)
Calcium: 8.5 mg/dL — ABNORMAL LOW (ref 8.9–10.3)
Chloride: 103 mmol/L (ref 98–111)
Creatinine, Ser: 0.72 mg/dL (ref 0.61–1.24)
GFR calc Af Amer: >60 mL/min (ref >60)
GFR calc non Af Amer: >60 mL/min (ref >60)
Glucose, Bld: 152 mg/dL — ABNORMAL HIGH (ref 70–99)
Potassium: 3.9 mmol/L (ref 3.5–5.1)
Sodium: 136 mmol/L (ref 135–145)
Total Bilirubin: 0.8 mg/dL (ref 0.3–1.2)
Total Protein: 6.5 g/dL (ref 6.5–8.1)

## 2019-06-20 LAB — CBC
HCT: 42.4 % (ref 39.0–52.0)
Hemoglobin: 14.2 g/dL (ref 13.0–17.0)
MCH: 31.4 pg (ref 26.0–34.0)
MCHC: 33.5 g/dL (ref 30.0–36.0)
MCV: 93.8 fL (ref 80.0–100.0)
Platelets: 249 10*3/uL (ref 150–400)
RBC: 4.52 MIL/uL (ref 4.22–5.81)
RDW: 13.1 % (ref 11.5–15.5)
WBC: 6.4 10*3/uL (ref 4.0–10.5)
nRBC: 0 % (ref 0.0–0.2)

## 2019-06-20 LAB — LIPASE, BLOOD: Lipase: 42 U/L (ref 11–51)

## 2019-06-20 MED ORDER — SODIUM CHLORIDE 0.9% FLUSH: 3.0000 mL | Freq: Once | INTRAVENOUS | Status: DC

## 2019-06-20 NOTE — ED Notes (Signed)
Called pt to recheck vitals. No response.  

## 2019-06-20 NOTE — ED Notes (Signed)
2x calling pt to recheck vitals. No response.  

## 2019-06-20 NOTE — ED Triage Notes (Signed)
Pt to ED with c/o upper abd pain with nausea and vomiting.  Pt has hx of pancreatitis

## 2019-06-21 ENCOUNTER — Other Ambulatory Visit: Payer: Self-pay

## 2019-06-21 ENCOUNTER — Emergency Department (HOSPITAL_COMMUNITY)
Admission: EM | Admit: 2019-06-21 | Discharge: 2019-06-22 | Disposition: A | Discharge status: Home / Self Care | Payer: Medicaid Other | Attending: Emergency Medicine | Admitting: Emergency Medicine

## 2019-06-21 ENCOUNTER — Encounter (HOSPITAL_COMMUNITY): Payer: Self-pay | Admitting: Emergency Medicine

## 2019-06-21 DIAGNOSIS — Z79899 Other long term (current) drug therapy: Secondary | ICD-10-CM | POA: Diagnosis not present

## 2019-06-21 DIAGNOSIS — R109 Unspecified abdominal pain: Secondary | ICD-10-CM | POA: Diagnosis present

## 2019-06-21 DIAGNOSIS — K861 Other chronic pancreatitis: Secondary | ICD-10-CM | POA: Diagnosis not present

## 2019-06-21 DIAGNOSIS — F17210 Nicotine dependence, cigarettes, uncomplicated: Secondary | ICD-10-CM | POA: Diagnosis not present

## 2019-06-21 DIAGNOSIS — K863 Pseudocyst of pancreas: Secondary | ICD-10-CM | POA: Insufficient documentation

## 2019-06-21 LAB — COMPREHENSIVE METABOLIC PANEL
ALT: 10 U/L (ref 0–44)
AST: 17 U/L (ref 15–41)
Albumin: 3.1 g/dL — ABNORMAL LOW (ref 3.5–5.0)
Alkaline Phosphatase: 173 U/L — ABNORMAL HIGH (ref 38–126)
Anion gap: 5 (ref 5–15)
BUN: 8 mg/dL (ref 6–20)
CO2: 29 mmol/L (ref 22–32)
Calcium: 8.8 mg/dL — ABNORMAL LOW (ref 8.9–10.3)
Chloride: 105 mmol/L (ref 98–111)
Creatinine, Ser: 0.89 mg/dL (ref 0.61–1.24)
GFR calc Af Amer: >60 mL/min (ref >60)
GFR calc non Af Amer: >60 mL/min (ref >60)
Glucose, Bld: 129 mg/dL — ABNORMAL HIGH (ref 70–99)
Potassium: 4.1 mmol/L (ref 3.5–5.1)
Sodium: 139 mmol/L (ref 135–145)
Total Bilirubin: 0.5 mg/dL (ref 0.3–1.2)
Total Protein: 6.8 g/dL (ref 6.5–8.1)

## 2019-06-21 LAB — CBC
HCT: 44.4 % (ref 39.0–52.0)
Hemoglobin: 14.1 g/dL (ref 13.0–17.0)
MCH: 30.1 pg (ref 26.0–34.0)
MCHC: 31.8 g/dL (ref 30.0–36.0)
MCV: 94.7 fL (ref 80.0–100.0)
Platelets: 270 10*3/uL (ref 150–400)
RBC: 4.69 MIL/uL (ref 4.22–5.81)
RDW: 13.2 % (ref 11.5–15.5)
WBC: 7.1 10*3/uL (ref 4.0–10.5)
nRBC: 0 % (ref 0.0–0.2)

## 2019-06-21 LAB — URINALYSIS, ROUTINE W REFLEX MICROSCOPIC
Bilirubin Urine: NEGATIVE
Glucose, UA: NEGATIVE mg/dL
Hgb urine dipstick: NEGATIVE
Ketones, ur: NEGATIVE mg/dL
Leukocytes,Ua: NEGATIVE
Nitrite: NEGATIVE
Protein, ur: NEGATIVE mg/dL
Specific Gravity, Urine: 1.018 (ref 1.005–1.030)
pH: 5 (ref 5.0–8.0)

## 2019-06-21 LAB — LIPASE, BLOOD: Lipase: 63 U/L — ABNORMAL HIGH (ref 11–51)

## 2019-06-21 MED ORDER — SODIUM CHLORIDE 0.9% FLUSH: 3.0000 mL | Freq: Once | INTRAVENOUS | Status: DC

## 2019-06-21 MED ORDER — HYDROCODONE-ACETAMINOPHEN 5-325 MG PO TABS
1.0000 | ORAL_TABLET | Freq: Once | ORAL | Status: AC
  Administered 2019-06-22: 1 via ORAL
  Filled 2019-06-21: qty 1

## 2019-06-21 MED ORDER — ONDANSETRON HCL 4 MG/2ML IJ SOLN
4.0000 mg | Freq: Once | INTRAMUSCULAR | Status: AC
  Administered 2019-06-21: 4 mg via INTRAVENOUS
  Filled 2019-06-21: qty 2

## 2019-06-21 NOTE — ED Provider Notes (Signed)
[facility]   CSN: [id number] Arrival date & time: 06/21/19  1435     History   Chief Complaint Chief Complaint  Patient presents with  . Abdominal Pain    HPI Austin Jenkins is a 33 y.o. male.     The history is provided by the patient.  Abdominal Pain Pain location:  Generalized Pain quality: aching   Pain severity:  Severe Onset quality:  Gradual Timing:  Constant Progression:  Worsening Chronicity:  Chronic Relieved by:  Nothing Worsened by:  Palpation and movement Associated symptoms: vomiting   Associated symptoms: no chest pain, no constipation, no diarrhea, no fever, no hematemesis and no hematochezia   pt with previous h/o ETOH abuse, pancreatitis presents with ongoing abdominal pain.  He admits this is a chronic problem for him He reports he has had nausea/vomiting over past 24 hrs No constipation, last BM was last night He reports he ran out of his home pain meds in past 24 hrs He reports his outpatient GI is attempting to coordinate outpatient management but this has not occurred as of yet.  He denies any recent alcohol use  Past Medical History:  Diagnosis Date  . Alcohol abuse   . Anxiety   . Depression   . IBS (irritable bowel syndrome)   . Pancreatitis     Patient Active Problem List   Diagnosis Date Noted  . Acute alcoholic pancreatitis 10/24/2018    Past Surgical History:  Procedure Laterality Date  . CHOLECYSTECTOMY          Home Medications    Prior to Admission medications   Medication Sig Start Date End Date Taking? Authorizing Provider  ALPRAZolam Prudy Feeler(XANAX) 1 MG tablet Take 1 mg by mouth 2 (two) times daily as needed for anxiety.    [provider]  buPROPion (WELLBUTRIN SR) 100 MG 12 hr tablet Take 100 mg by mouth daily. 03/28/19   [provider]  HYDROcodone-acetaminophen (NORCO/VICODIN) 5-325 MG tablet Take 1-2 tablets by mouth every 4 (four) hours as  needed for moderate pain. Patient not taking: Reported on 06/11/2019 04/03/19   Gilda CreasePollina, Christopher J, MD  ondansetron (ZOFRAN ODT) 4 MG disintegrating tablet Take 1 tablet (4 mg total) by mouth every 8 (eight) hours as needed for nausea or vomiting. 05/05/19   Alvira MondaySchlossman, Erin, MD  ondansetron (ZOFRAN) 4 MG tablet Take 1 tablet (4 mg total) by mouth every 6 (six) hours as needed for nausea or vomiting. 04/03/19   Gilda CreasePollina, Christopher J, MD  pantoprazole (PROTONIX) 40 MG tablet Take 1 tablet (40 mg total) by mouth daily. 05/31/19   Dione BoozeGlick, David, MD    Family History No family history on file.  Social History Social History   Tobacco Use  . Smoking status: Current Every Day Smoker    Packs/day: 0.50  . Smokeless tobacco: Current User  Substance Use Topics  . Alcohol use: Yes    Alcohol/week: 42.0 standard drinks    Types: 42 Cans of beer per week  . Drug use: Not on file     Allergies   Patient has no known allergies.   Review of Systems Review of Systems  Constitutional: Negative for fever.  Respiratory:       "smokers cough"   Cardiovascular: Negative for chest pain.  Gastrointestinal: Positive for abdominal pain and vomiting. Negative for blood in stool, constipation, diarrhea, hematemesis and hematochezia.  All other systems reviewed and are negative.    Physical Exam  Updated Vital Signs BP 103/87 (BP Location: Right Arm)   Pulse 75   Temp 98.7 F (37.1 C) (Oral)   Resp 18   SpO2 100%   Physical Exam CONSTITUTIONAL: thin appearing, no acute distress HEAD: Normocephalic/atraumatic EYES: EOMI/PERRL, no icterus ENMT: Mucous membranes moist NECK: supple no meningeal signs SPINE/BACK:entire spine nontender CV: S1/S2 noted, no murmurs/rubs/gallops noted LUNGS: Lungs are clear to auscultation bilaterally, no apparent distress ABDOMEN: soft, nontender, no rebound or guarding, bowel sounds noted throughout abdomen GU:no cva tenderness NEURO: Pt is  awake/alert/appropriate, moves all extremitiesx4.  No facial droop.   EXTREMITIES: pulses normal/equal, full ROM SKIN: warm, color normal PSYCH: no abnormalities of mood noted, alert and oriented to situation   ED Treatments / Results  Labs (all labs ordered are listed, but only abnormal results are displayed) Labs Reviewed  LIPASE, BLOOD - Abnormal; Notable for the following components:      Result Value   Lipase 63 (*)    All other components within normal limits  COMPREHENSIVE METABOLIC PANEL - Abnormal; Notable for the following components:   Glucose, Bld 129 (*)    Calcium 8.8 (*)    Albumin 3.1 (*)    Alkaline Phosphatase 173 (*)    All other components within normal limits  URINALYSIS, ROUTINE W REFLEX MICROSCOPIC - Abnormal; Notable for the following components:   APPearance HAZY (*)    All other components within normal limits  CBC    EKG None  Radiology No results found.  Procedures Procedures    Medications Ordered in ED Medications  sodium chloride flush (NS) 0.9 % injection 3 mL (3 mLs Intravenous Not Given 06/21/19 2244)  fentaNYL (SUBLIMAZE) injection 100 mcg (has no administration in time range)  ondansetron (ZOFRAN) injection 4 mg (4 mg Intravenous Given 06/21/19 2337)  HYDROcodone-acetaminophen (NORCO/VICODIN) 5-325 MG per tablet 1 tablet (1 tablet Oral Given 06/22/19 0002)     Initial Impression / Assessment and Plan / ED Course  I have reviewed the triage vital signs and the nursing notes.  Pertinent labs & imaging results that were available during my care of the patient were reviewed by me and considered in my medical decision making (see chart for details).        11:37 PM Pt has had 9 ER visits in 6 months for pancreatitis Previous CT imaging has revealed significant pancreatitis and pseudocyst (9/9/92020) He is followed by Dr. Cloretta [facility]   the plan was to recommend nasojejunal tube to bypass the pancreas for pancreas rest and  [facility] GI referral if symptoms persisted Pt reports he has tried to get this set up but has not happened as of yet Labs essentially unchanged Abdominal exam is unremarkable, no focal tenderness Will ensure he can take PO I don't feel repeat CT is required (he has had 5 CT a/p since December) 12:31 AM Patient currently stable.  He is taking p.o. fluids.  Vitals are appropriate.  Suspect this is all related to his ongoing pancreatitis that he has had for a while.  I do not feel further evaluation is warranted.  Patient will need to follow-up for pain prescriptions.  He is to call his gastroenterologist next week to ensure follow-up. Will prescribe Zofran ODT for patient Final Clinical Impressions(s) / ED Diagnoses   Final diagnoses:  Chronic pancreatitis, unspecified pancreatitis type [facility])    ED Discharge Orders         Ordered    ondansetron (ZOFRAN ODT) 8 MG  disintegrating tablet  Every 8 hours PRN     06/22/19 0024           Ripley Fraise, MD 06/22/19 0032

## 2019-06-21 NOTE — ED Triage Notes (Signed)
Pt endorses right sided abd pain that radiates to left side and to back. States he has pancreatitis and has not had alcohol in months.

## 2019-06-21 NOTE — ED Notes (Addendum)
Updated on wait for treatment room.  PT states his pain from chronic pancreatitis has increased to 9/10.  Informed pt that at this time he is longest wait and 1 person with higher acuity in front of him as long as nothing changes.

## 2019-06-22 MED ORDER — FENTANYL CITRATE (PF) 100 MCG/2ML IJ SOLN
100.0000 ug | Freq: Once | INTRAMUSCULAR | Status: DC
  Filled 2019-06-22: qty 2

## 2019-06-22 MED ORDER — ONDANSETRON 8 MG PO TBDP: 8.0000 mg | ORAL_TABLET | Freq: Three times a day (TID) | ORAL | 0 refills | Status: DC | PRN

## 2019-06-22 NOTE — ED Notes (Signed)
Po challenge started.

## 2019-06-30 ENCOUNTER — Encounter (HOSPITAL_COMMUNITY): Payer: Self-pay | Admitting: Emergency Medicine

## 2019-06-30 ENCOUNTER — Other Ambulatory Visit: Payer: Self-pay

## 2019-06-30 ENCOUNTER — Emergency Department (HOSPITAL_COMMUNITY): Payer: Medicaid Other

## 2019-06-30 ENCOUNTER — Emergency Department (HOSPITAL_COMMUNITY)
Admission: EM | Admit: 2019-06-30 | Discharge: 2019-06-30 | Disposition: A | Discharge status: Home / Self Care | Payer: Medicaid Other | Attending: Emergency Medicine | Admitting: Emergency Medicine

## 2019-06-30 DIAGNOSIS — F17220 Nicotine dependence, chewing tobacco, uncomplicated: Secondary | ICD-10-CM | POA: Insufficient documentation

## 2019-06-30 DIAGNOSIS — K861 Other chronic pancreatitis: Secondary | ICD-10-CM

## 2019-06-30 DIAGNOSIS — Z79899 Other long term (current) drug therapy: Secondary | ICD-10-CM | POA: Diagnosis not present

## 2019-06-30 DIAGNOSIS — F17200 Nicotine dependence, unspecified, uncomplicated: Secondary | ICD-10-CM | POA: Insufficient documentation

## 2019-06-30 DIAGNOSIS — K5900 Constipation, unspecified: Secondary | ICD-10-CM | POA: Diagnosis not present

## 2019-06-30 DIAGNOSIS — R1084 Generalized abdominal pain: Secondary | ICD-10-CM | POA: Insufficient documentation

## 2019-06-30 DIAGNOSIS — R109 Unspecified abdominal pain: Secondary | ICD-10-CM | POA: Diagnosis present

## 2019-06-30 LAB — COMPREHENSIVE METABOLIC PANEL
ALT: 12 U/L (ref 0–44)
AST: 21 U/L (ref 15–41)
Albumin: 3.1 g/dL — ABNORMAL LOW (ref 3.5–5.0)
Alkaline Phosphatase: 189 U/L — ABNORMAL HIGH (ref 38–126)
Anion gap: 11 (ref 5–15)
BUN: 7 mg/dL (ref 6–20)
CO2: 24 mmol/L (ref 22–32)
Calcium: 8.9 mg/dL (ref 8.9–10.3)
Chloride: 98 mmol/L (ref 98–111)
Creatinine, Ser: 0.92 mg/dL (ref 0.61–1.24)
GFR calc Af Amer: >60 mL/min (ref >60)
GFR calc non Af Amer: >60 mL/min (ref >60)
Glucose, Bld: 183 mg/dL — ABNORMAL HIGH (ref 70–99)
Potassium: 3.9 mmol/L (ref 3.5–5.1)
Sodium: 133 mmol/L — ABNORMAL LOW (ref 135–145)
Total Bilirubin: 0.9 mg/dL (ref 0.3–1.2)
Total Protein: 6.8 g/dL (ref 6.5–8.1)

## 2019-06-30 LAB — URINALYSIS, ROUTINE W REFLEX MICROSCOPIC
Bilirubin Urine: NEGATIVE
Glucose, UA: NEGATIVE mg/dL
Hgb urine dipstick: NEGATIVE
Ketones, ur: 20 mg/dL — AB
Leukocytes,Ua: NEGATIVE
Nitrite: NEGATIVE
Protein, ur: NEGATIVE mg/dL
Specific Gravity, Urine: 1.029 (ref 1.005–1.030)
pH: 6 (ref 5.0–8.0)

## 2019-06-30 LAB — CBC
HCT: 45.1 % (ref 39.0–52.0)
Hemoglobin: 15.1 g/dL (ref 13.0–17.0)
MCH: 30.8 pg (ref 26.0–34.0)
MCHC: 33.5 g/dL (ref 30.0–36.0)
MCV: 91.9 fL (ref 80.0–100.0)
Platelets: 296 10*3/uL (ref 150–400)
RBC: 4.91 MIL/uL (ref 4.22–5.81)
RDW: 13.1 % (ref 11.5–15.5)
WBC: 7.2 10*3/uL (ref 4.0–10.5)
nRBC: 0 % (ref 0.0–0.2)

## 2019-06-30 LAB — LIPASE, BLOOD: Lipase: 26 U/L (ref 11–51)

## 2019-06-30 MED ORDER — MORPHINE SULFATE (PF) 4 MG/ML IV SOLN
4.0000 mg | Freq: Once | INTRAVENOUS | Status: AC
  Administered 2019-06-30: 4 mg via INTRAVENOUS
  Filled 2019-06-30: qty 1

## 2019-06-30 MED ORDER — SODIUM CHLORIDE 0.9 % IV BOLUS
1000.0000 mL | Freq: Once | INTRAVENOUS | Status: AC
  Administered 2019-06-30: 10:00:00 1000 mL via INTRAVENOUS

## 2019-06-30 MED ORDER — DOCUSATE SODIUM 250 MG PO CAPS: 250.0000 mg | ORAL_CAPSULE | Freq: Every day | ORAL | 0 refills | Status: DC

## 2019-06-30 MED ORDER — SODIUM CHLORIDE 0.9% FLUSH
3.0000 mL | Freq: Once | INTRAVENOUS | Status: AC
  Administered 2019-06-30: 3 mL via INTRAVENOUS

## 2019-06-30 MED ORDER — ONDANSETRON HCL 4 MG/2ML IJ SOLN
4.0000 mg | Freq: Once | INTRAMUSCULAR | Status: AC
  Administered 2019-06-30: 4 mg via INTRAVENOUS
  Filled 2019-06-30: qty 2

## 2019-06-30 MED ORDER — IOHEXOL 300 MG/ML  SOLN
100.0000 mL | Freq: Once | INTRAMUSCULAR | Status: AC | PRN
  Administered 2019-06-30: 100 mL via INTRAVENOUS

## 2019-06-30 MED ORDER — ONDANSETRON HCL 4 MG/2ML IJ SOLN
4.0000 mg | Freq: Once | INTRAMUSCULAR | Status: AC
  Administered 2019-06-30: 10:00:00 4 mg via INTRAVENOUS
  Filled 2019-06-30: qty 2

## 2019-06-30 MED ORDER — FENTANYL CITRATE (PF) 100 MCG/2ML IJ SOLN
50.0000 ug | Freq: Once | INTRAMUSCULAR | Status: AC
  Administered 2019-06-30: 13:00:00 50 ug via INTRAVENOUS
  Filled 2019-06-30: qty 2

## 2019-06-30 NOTE — ED Notes (Signed)
Pt ambulated to bathroom with stable gait. Pt unable to give urine sample.

## 2019-06-30 NOTE — ED Notes (Signed)
Pt unable to give urine sample at this time. Pt is aware that a urine sample is needed. Urinal is at bedside. Will try again later.

## 2019-06-30 NOTE — ED Provider Notes (Signed)
[facility] EMERGENCY DEPARTMENT Provider Note   CSN: [id number] Arrival date & time: 06/30/19  8032     History   Chief Complaint Chief Complaint  Patient presents with   Abdominal Pain   Shortness of Breath    HPI Read Bonelli is a 33 y.o. male past history of chronic pancreatitis, alcohol abuse, anxiety, depression, IBS who presents for evaluation of abdominal pain, constipation, shortness of breath.  He reports that his last bowel movement was approximately 5 days ago.  He reports that he was still passing flatus up until yesterday but then has not since last night.  States he has not had any vomiting but has had nausea and decreased appetite.  He reports that this morning, he started having some abdominal pain.  He reports some associated shortness of breath when he had the pain.  He states that when he would get sharp episodes of pain, would take his breath away.  No associated chest pain.  Denies any shortness of breath at this time.  Denies any chest pain.  He states the abdominal pain is diffusely throughout with no focal point.  He has not tried any other medications.  He states he has not drank any alcohol in the last 4 months.  He does report that he smokes about half a pack a day.  He has not noted any fevers, urinary complaints. He denies any exogenous hormone use, recent immobilization, prior history of DVT/PE, recent surgery, leg swelling, or long travel.     The history is provided by the patient.    Past Medical History:  Diagnosis Date   Alcohol abuse    Anxiety    Depression    IBS (irritable bowel syndrome)    Pancreatitis     Patient Active Problem List   Diagnosis Date Noted   Acute alcoholic pancreatitis 09/25/8249    Past Surgical History:  Procedure Laterality Date   CHOLECYSTECTOMY          Home Medications    Prior to Admission medications   Medication Sig Start Date End Date Taking? Authorizing Provider   ALPRAZolam Duanne Moron) 1 MG tablet Take 1 mg by mouth 2 (two) times daily as needed for anxiety.   Yes [provider]  buPROPion (WELLBUTRIN SR) 100 MG 12 hr tablet Take 100 mg by mouth daily. 03/28/19  Yes [provider]  ondansetron (ZOFRAN) 4 MG tablet Take 1 tablet (4 mg total) by mouth every 6 (six) hours as needed for nausea or vomiting. 04/03/19  Yes Pollina, Gwenyth Allegra, MD  Oxycodone HCl 10 MG TABS Take 10 mg by mouth 4 (four) times daily as needed (pain).   Yes [provider]  pantoprazole (PROTONIX) 40 MG tablet Take 1 tablet (40 mg total) by mouth daily. 0/37/04  Yes Delora Fuel, MD  docusate sodium (COLACE) 250 MG capsule Take 1 capsule (250 mg total) by mouth daily. 06/30/19   Volanda Napoleon, PA-C  ondansetron (ZOFRAN ODT) 8 MG disintegrating tablet Take 1 tablet (8 mg total) by mouth every 8 (eight) hours as needed. Patient not taking: Reported on 06/30/2019 06/22/19   Ripley Fraise, MD  oxymorphone (OPANA ER) 10 MG 12 hr tablet Take 10 mg by mouth every 8 (eight) hours as needed for pain.    [provider]    Family History No family history on file.  Social History Social History   Tobacco Use   Smoking status: Current Every Day Smoker  Packs/day: 0.50   Smokeless tobacco: Current User  Substance Use Topics   Alcohol use: Yes    Alcohol/week: 42.0 standard drinks    Types: 42 Cans of beer per week   Drug use: Not on file     Allergies   Patient has no known allergies.   Review of Systems Review of Systems  Constitutional: Positive for appetite change. Negative for fever.  Respiratory: Negative for cough and shortness of breath (Resolved).   Cardiovascular: Negative for chest pain.  Gastrointestinal: Positive for abdominal pain, constipation and nausea. Negative for vomiting.  Genitourinary: Negative for dysuria and hematuria.  Neurological: Negative for headaches.  All other systems reviewed and are  negative.    Physical Exam Updated Vital Signs BP 95/72 (BP Location: Left Arm)    Pulse 79    Temp 98.4 F (36.9 C) (Oral)    Resp 16    Ht 5' 10"  (1.778 m)    Wt 61.2 kg    SpO2 97%    BMI 19.37 kg/m   Physical Exam Vitals signs and nursing note reviewed.  Constitutional:      Appearance: Normal appearance. He is well-developed.     Comments: Sitting comfortably on examination table  HENT:     Head: Normocephalic and atraumatic.  Eyes:     General: Lids are normal.     Conjunctiva/sclera: Conjunctivae normal.     Pupils: Pupils are equal, round, and reactive to light.  Neck:     Musculoskeletal: Full passive range of motion without pain.  Cardiovascular:     Rate and Rhythm: Normal rate and regular rhythm.     Pulses: Normal pulses.     Heart sounds: Normal heart sounds. No murmur. No friction rub. No gallop.   Pulmonary:     Effort: Pulmonary effort is normal.     Breath sounds: Normal breath sounds.     Comments: Lungs clear to auscultation bilaterally.  Symmetric chest rise.  No wheezing, rales, rhonchi. Abdominal:     Palpations: Abdomen is soft. Abdomen is not rigid.     Tenderness: There is no abdominal tenderness. There is no guarding.     Comments: Abdomen soft, nondistended.  Generalized tenderness noted diffusely throughout.  No rigidity.  He does have some voluntary guarding.  Mild CVA tenderness noted bilaterally.  Musculoskeletal: Normal range of motion.  Skin:    General: Skin is warm and dry.     Capillary Refill: Capillary refill takes less than 2 seconds.  Neurological:     Mental Status: He is alert and oriented to person, place, and time.  Psychiatric:        Speech: Speech normal.      ED Treatments / Results  Labs (all labs ordered are listed, but only abnormal results are displayed) Labs Reviewed  COMPREHENSIVE METABOLIC PANEL - Abnormal; Notable for the following components:      Result Value   Sodium 133 (*)    Glucose, Bld 183 (*)     Albumin 3.1 (*)    Alkaline Phosphatase 189 (*)    All other components within normal limits  URINALYSIS, ROUTINE W REFLEX MICROSCOPIC - Abnormal; Notable for the following components:   Ketones, ur 20 (*)    All other components within normal limits  LIPASE, BLOOD  CBC    EKG EKG Interpretation  Date/Time:  Sunday June 30 2019 06:41:10 EDT Ventricular Rate:  126 PR Interval:    QRS Duration: 90 QT Interval:  306  QTC Calculation: 443 R Axis:   80 Text Interpretation:  Sinus tachycardia Multiform ventricular premature complexes Aberrant complex Consider right atrial enlargement RSR' in V1 or V2, probably normal variant tachycardia but otherwise unchanged QRS morphology Confirmed by Charlesetta Shanks [phone]) on 06/30/2019 1:42:22 PM   Radiology Ct Abdomen Pelvis W Contrast  Result Date: 06/30/2019 CLINICAL DATA:  Diffuse abdominal pain starting this morning. Nausea and constipation for 4 days. EXAM: CT ABDOMEN AND PELVIS WITH CONTRAST TECHNIQUE: Multidetector CT imaging of the abdomen and pelvis was performed using the standard protocol following bolus administration of intravenous contrast. CONTRAST:  120m OMNIPAQUE IOHEXOL 300 MG/ML  SOLN COMPARISON:  06/12/2019 FINDINGS: Lower chest: Unremarkable. Hepatobiliary: Tiny hypoattenuating lesion in the lateral segment left liver is stable and while too small to characterize, most likely benign and probably a cyst. Intrahepatic biliary duct prominence is similar. Gallbladder is surgically absent. No evidence for common bile duct dilatation. Pancreas: Pancreatic and peripancreatic edema is again noted. Rim enhancing fluid collection seen along the body and tail of pancreas previously is minimally smaller, measuring 4.7 x 2.9 cm today compared to 5.7 x 3.1 cm previously. Edema/inflammation between the pancreas in the stomach has decreased slightly in the interval. Posterior wall thickening in the stomach is likely reactive and appears slightly  decreased. Spleen: No splenomegaly. No focal mass lesion. Adrenals/Urinary Tract: No adrenal nodule or mass. Kidneys unremarkable. No evidence for hydroureter. The urinary bladder appears normal for the degree of distention. Stomach/Bowel: As noted above, posterior gastric wall thickening appears improved in the interval. Duodenum is normally positioned as is the ligament of Treitz. No small bowel wall thickening. No small bowel dilatation. The terminal ileum is normal. The appendix is normal. No gross colonic mass. No colonic wall thickening. Vascular/Lymphatic: There is abdominal aortic atherosclerosis without aneurysm. Portal vein and superior mesenteric vein are patent although there is some minimal mass-effect on the SMV just proximal to the confluence. Splenic vein is markedly attenuated along the tail of pancreas but appears to remain patent. No vascular complication involving the celiac axis or SMA. There is no gastrohepatic or hepatoduodenal ligament lymphadenopathy. No intraperitoneal or retroperitoneal lymphadenopathy. No pelvic sidewall lymphadenopathy. Reproductive: The prostate gland and seminal vesicles are unremarkable. Other: No intraperitoneal free fluid. Musculoskeletal: No worrisome lytic or sclerotic osseous abnormality. IMPRESSION: 1. Overall generalized trend towards mild improvement of abdominal sequelae of pancreatitis. The rim enhancing fluid collection identified along the body and tail of pancreas measures slightly smaller today. The peripancreatic edema/inflammation extending to and involving the posterior wall the stomach appears decreased. No evidence for vascular complication. 2. No new or progressive interval findings. Electronically Signed   By: EMisty StanleyM.D.   On: 06/30/2019 11:40    Procedures Procedures (including critical care time)  Medications Ordered in ED Medications  sodium chloride flush (NS) 0.9 % injection 3 mL (3 mLs Intravenous Given 06/30/19 1013)  sodium  chloride 0.9 % bolus 1,000 mL (0 mLs Intravenous Stopped 06/30/19 1130)  morphine 4 MG/ML injection 4 mg (4 mg Intravenous Given 06/30/19 1029)  ondansetron (ZOFRAN) injection 4 mg (4 mg Intravenous Given 06/30/19 1027)  iohexol (OMNIPAQUE) 300 MG/ML solution 100 mL (100 mLs Intravenous Contrast Given 06/30/19 1047)  fentaNYL (SUBLIMAZE) injection 50 mcg (50 mcg Intravenous Given 06/30/19 1256)  ondansetron (ZOFRAN) injection 4 mg (4 mg Intravenous Given 06/30/19 1254)     Initial Impression / Assessment and Plan / ED Course  I have reviewed the triage vital signs and the nursing notes.  Pertinent labs & imaging results that were available during my care of the patient were reviewed by me and considered in my medical decision making (see chart for details).        33 year old male who presents for evaluation of abdominal pain, constipation.  No bowel movement in 5 days.  Was passing flatus up until yesterday.  Reports abdominal pain started today.  Initially had some shortness of breath with the pain but states that has since resolved.  History of pancreatitis and had been seen here in early September for evaluation and GI had recommended admission.  Patient declined and left AMA.  He is scheduled to see [facility] for an NG tube for further evaluation but has not seen them. Patient is afebrile, non-toxic appearing, sitting comfortably on examination table. Vital signs reviewed and stable.  Exam, he has generalized abdominal tenderness no focal point.  Concern for infectious process versus worsening chronic pancreatitis.  Also consider small bowel obstruction given history of constipation.  Labs ordered at triage.  Lipase is unremarkable.  CBC without any significant leukocytosis or anemia.  CMP shows alk phos of 189.  Review of his records show that this is consistent with baseline.  At this time, I doubt hepatobiliary etiology as he is diffusely tender throughout. UA negative for any infectious  etiology.   CT abd/pelvis shows mild improvement of sequalae of pancreatitis. Rim enhancing fluid collection identified along the body and tail of pancreas measures slightly smaller today. Peripancreatic/edema/inflammation extending to and involving the posterior wall the stomach appears decreased.   Reevaluation.  Patient reports improvement in pain.  His repeat abdominal exam actually improved.  Will give additional analgesics and reassess.  Reevaluation.  Patient does report improvement of pain after analgesics.  He is sitting comfortably in bed with no signs of distress.  Repeat abdominal exam is improved.  Will give patient stool softener to help with constipation.  Instructed patient to follow-up with GI as directed. Discussed patient with Dr. Johnney Killian who is agreeable to plan. At this time, patient exhibits no emergent life-threatening condition that require further evaluation in ED or admission. Patient had ample opportunity for questions and discussion. All patient's questions were answered with full understanding. Strict return precautions discussed. Patient expresses understanding and agreement to plan.   Portions of this note were generated with [employer]. Dictation errors may occur despite best attempts at proofreading.   Final Clinical Impressions(s) / ED Diagnoses   Final diagnoses:  Generalized abdominal pain  Constipation, unspecified constipation type  Chronic pancreatitis, unspecified pancreatitis type [facility])    ED Discharge Orders         Ordered    docusate sodium (COLACE) 250 MG capsule  Daily     06/30/19 1349           Desma Mcgregor 06/30/19 1514    Charlesetta Shanks, MD 06/30/19 1631

## 2019-06-30 NOTE — ED Notes (Signed)
Pt resting quietly states his abd cramping is spasmodic in nature.  Causes dyspnea when occurs.  Has tried OTC stool softeners and mirilax.  Last BM 4 days ago.  No recent hx of constipation.  Some nausea without noted emesis.

## 2019-06-30 NOTE — ED Triage Notes (Signed)
Patient with abdominal pain and shortness of breath for the last few days.  No vomiting, but patient does have nausea.  Patient has pancreatitis and has been seen for same earlier in the month.  Patient denies any alcohol use.

## 2019-06-30 NOTE — ED Notes (Signed)
Pt provided with sprite.  °

## 2019-06-30 NOTE — Discharge Instructions (Addendum)
Follow up with GI doctors as directed.   Take Colace for constipation.   Return to the Emergency Department immediately if you experience any worsening abdominal pain, fever, persistent nausea and vomiting, inability keep any food down, pain with urination, blood in your urine or any other worsening or concerning symptoms.

## 2019-06-30 NOTE — ED Notes (Signed)
Pt attempted to give urine sample and have BM without success. No gross changes in s/sx

## 2019-08-07 ENCOUNTER — Encounter (HOSPITAL_COMMUNITY): Payer: Self-pay | Admitting: Emergency Medicine

## 2019-08-07 ENCOUNTER — Emergency Department (HOSPITAL_COMMUNITY): Payer: Medicaid Other

## 2019-08-07 ENCOUNTER — Emergency Department (HOSPITAL_COMMUNITY)
Admission: EM | Admit: 2019-08-07 | Discharge: 2019-08-08 | Discharge status: Against Medical Advice | Payer: Medicaid Other | Source: Home / Self Care

## 2019-08-07 ENCOUNTER — Other Ambulatory Visit: Payer: Self-pay

## 2019-08-07 DIAGNOSIS — Z5321 Procedure and treatment not carried out due to patient leaving prior to being seen by health care provider: Secondary | ICD-10-CM | POA: Insufficient documentation

## 2019-08-07 LAB — BASIC METABOLIC PANEL
Anion gap: 10 (ref 5–15)
BUN: <5 mg/dL — ABNORMAL LOW (ref 6–20)
CO2: 23 mmol/L (ref 22–32)
Calcium: 8.7 mg/dL — ABNORMAL LOW (ref 8.9–10.3)
Chloride: 99 mmol/L (ref 98–111)
Creatinine, Ser: 0.67 mg/dL (ref 0.61–1.24)
GFR calc Af Amer: >60 mL/min (ref >60)
GFR calc non Af Amer: >60 mL/min (ref >60)
Glucose, Bld: 147 mg/dL — ABNORMAL HIGH (ref 70–99)
Potassium: 3.2 mmol/L — ABNORMAL LOW (ref 3.5–5.1)
Sodium: 132 mmol/L — ABNORMAL LOW (ref 135–145)

## 2019-08-07 LAB — CBC
HCT: 36.1 % — ABNORMAL LOW (ref 39.0–52.0)
Hemoglobin: 12 g/dL — ABNORMAL LOW (ref 13.0–17.0)
MCH: 29.1 pg (ref 26.0–34.0)
MCHC: 33.2 g/dL (ref 30.0–36.0)
MCV: 87.4 fL (ref 80.0–100.0)
Platelets: 193 10*3/uL (ref 150–400)
RBC: 4.13 MIL/uL — ABNORMAL LOW (ref 4.22–5.81)
RDW: 12.6 % (ref 11.5–15.5)
WBC: 5.8 10*3/uL (ref 4.0–10.5)
nRBC: 0 % (ref 0.0–0.2)

## 2019-08-07 LAB — PROTIME-INR
INR: 1.1 (ref 0.8–1.2)
Prothrombin Time: 14 seconds (ref 11.4–15.2)

## 2019-08-07 LAB — TROPONIN I (HIGH SENSITIVITY): Troponin I (High Sensitivity): <2 ng/L (ref <18)

## 2019-08-07 MED ORDER — SODIUM CHLORIDE 0.9% FLUSH: 3.0000 mL | Freq: Once | INTRAVENOUS | Status: DC

## 2019-08-07 NOTE — ED Triage Notes (Signed)
Patient reports central chest pain , epigastric pain , SOB and mid back pain onset this evening , denies emesis , no fever or chills

## 2019-08-08 NOTE — ED Notes (Signed)
Called pt for room, no response. 

## 2019-08-08 NOTE — ED Notes (Signed)
Pt did not respond when called back for 2nd Trop

## 2019-08-09 ENCOUNTER — Encounter (HOSPITAL_COMMUNITY): Payer: Self-pay | Admitting: Emergency Medicine

## 2019-08-09 ENCOUNTER — Inpatient Hospital Stay (HOSPITAL_COMMUNITY)
Admission: EM | Admit: 2019-08-09 | Discharge: 2019-08-10 | DRG: 440 | Disposition: A | Discharge status: Home / Self Care | Payer: Medicaid Other | Attending: Internal Medicine | Admitting: Internal Medicine

## 2019-08-09 ENCOUNTER — Emergency Department (HOSPITAL_COMMUNITY): Payer: Medicaid Other

## 2019-08-09 ENCOUNTER — Other Ambulatory Visit: Payer: Self-pay

## 2019-08-09 DIAGNOSIS — Z833 Family history of diabetes mellitus: Secondary | ICD-10-CM | POA: Diagnosis not present

## 2019-08-09 DIAGNOSIS — R1084 Generalized abdominal pain: Secondary | ICD-10-CM | POA: Diagnosis present

## 2019-08-09 DIAGNOSIS — F1011 Alcohol abuse, in remission: Secondary | ICD-10-CM | POA: Diagnosis present

## 2019-08-09 DIAGNOSIS — Z79899 Other long term (current) drug therapy: Secondary | ICD-10-CM

## 2019-08-09 DIAGNOSIS — K863 Pseudocyst of pancreas: Secondary | ICD-10-CM

## 2019-08-09 DIAGNOSIS — K859 Acute pancreatitis without necrosis or infection, unspecified: Secondary | ICD-10-CM | POA: Diagnosis present

## 2019-08-09 DIAGNOSIS — Z8249 Family history of ischemic heart disease and other diseases of the circulatory system: Secondary | ICD-10-CM

## 2019-08-09 DIAGNOSIS — Z720 Tobacco use: Secondary | ICD-10-CM

## 2019-08-09 DIAGNOSIS — Z9049 Acquired absence of other specified parts of digestive tract: Secondary | ICD-10-CM

## 2019-08-09 DIAGNOSIS — Z79891 Long term (current) use of opiate analgesic: Secondary | ICD-10-CM

## 2019-08-09 DIAGNOSIS — Z20828 Contact with and (suspected) exposure to other viral communicable diseases: Secondary | ICD-10-CM | POA: Diagnosis present

## 2019-08-09 DIAGNOSIS — F418 Other specified anxiety disorders: Secondary | ICD-10-CM | POA: Diagnosis present

## 2019-08-09 DIAGNOSIS — F17210 Nicotine dependence, cigarettes, uncomplicated: Secondary | ICD-10-CM | POA: Diagnosis present

## 2019-08-09 DIAGNOSIS — R634 Abnormal weight loss: Secondary | ICD-10-CM

## 2019-08-09 DIAGNOSIS — K861 Other chronic pancreatitis: Secondary | ICD-10-CM | POA: Diagnosis present

## 2019-08-09 DIAGNOSIS — K589 Irritable bowel syndrome without diarrhea: Secondary | ICD-10-CM | POA: Diagnosis present

## 2019-08-09 DIAGNOSIS — Z91018 Allergy to other foods: Secondary | ICD-10-CM | POA: Diagnosis not present

## 2019-08-09 DIAGNOSIS — F419 Anxiety disorder, unspecified: Secondary | ICD-10-CM | POA: Diagnosis present

## 2019-08-09 DIAGNOSIS — F329 Major depressive disorder, single episode, unspecified: Secondary | ICD-10-CM

## 2019-08-09 DIAGNOSIS — E876 Hypokalemia: Secondary | ICD-10-CM | POA: Diagnosis present

## 2019-08-09 LAB — COMPREHENSIVE METABOLIC PANEL
ALT: 14 U/L (ref 0–44)
AST: 18 U/L (ref 15–41)
Albumin: 3.4 g/dL — ABNORMAL LOW (ref 3.5–5.0)
Alkaline Phosphatase: 118 U/L (ref 38–126)
Anion gap: 9 (ref 5–15)
BUN: <5 mg/dL — ABNORMAL LOW (ref 6–20)
CO2: 25 mmol/L (ref 22–32)
Calcium: 8.6 mg/dL — ABNORMAL LOW (ref 8.9–10.3)
Chloride: 99 mmol/L (ref 98–111)
Creatinine, Ser: 0.77 mg/dL (ref 0.61–1.24)
GFR calc Af Amer: >60 mL/min (ref >60)
GFR calc non Af Amer: >60 mL/min (ref >60)
Glucose, Bld: 102 mg/dL — ABNORMAL HIGH (ref 70–99)
Potassium: 3 mmol/L — ABNORMAL LOW (ref 3.5–5.1)
Sodium: 133 mmol/L — ABNORMAL LOW (ref 135–145)
Total Bilirubin: 0.4 mg/dL (ref 0.3–1.2)
Total Protein: 6.6 g/dL (ref 6.5–8.1)

## 2019-08-09 LAB — CBC
HCT: 39 % (ref 39.0–52.0)
Hemoglobin: 12.8 g/dL — ABNORMAL LOW (ref 13.0–17.0)
MCH: 29 pg (ref 26.0–34.0)
MCHC: 32.8 g/dL (ref 30.0–36.0)
MCV: 88.4 fL (ref 80.0–100.0)
Platelets: 223 10*3/uL (ref 150–400)
RBC: 4.41 MIL/uL (ref 4.22–5.81)
RDW: 13 % (ref 11.5–15.5)
WBC: 7 10*3/uL (ref 4.0–10.5)
nRBC: 0 % (ref 0.0–0.2)

## 2019-08-09 LAB — URINALYSIS, ROUTINE W REFLEX MICROSCOPIC
Bilirubin Urine: NEGATIVE
Glucose, UA: NEGATIVE mg/dL
Hgb urine dipstick: NEGATIVE
Ketones, ur: NEGATIVE mg/dL
Leukocytes,Ua: NEGATIVE
Nitrite: NEGATIVE
Protein, ur: NEGATIVE mg/dL
Specific Gravity, Urine: 1.002 — ABNORMAL LOW (ref 1.005–1.030)
pH: 7 (ref 5.0–8.0)

## 2019-08-09 LAB — LIPASE, BLOOD: Lipase: 61 U/L — ABNORMAL HIGH (ref 11–51)

## 2019-08-09 MED ORDER — PROMETHAZINE HCL 25 MG/ML IJ SOLN
25.0000 mg | Freq: Four times a day (QID) | INTRAMUSCULAR | Status: DC | PRN
  Administered 2019-08-09 – 2019-08-10 (×2): 25 mg via INTRAVENOUS
  Filled 2019-08-09 (×2): qty 1

## 2019-08-09 MED ORDER — SODIUM CHLORIDE 0.9 % IV BOLUS
1000.0000 mL | Freq: Once | INTRAVENOUS | Status: AC
  Administered 2019-08-09: 1000 mL via INTRAVENOUS

## 2019-08-09 MED ORDER — ACETAMINOPHEN 650 MG RE SUPP: 650.0000 mg | Freq: Four times a day (QID) | RECTAL | Status: DC | PRN

## 2019-08-09 MED ORDER — PROMETHAZINE HCL 25 MG/ML IJ SOLN
25.0000 mg | Freq: Once | INTRAMUSCULAR | Status: AC
  Administered 2019-08-09: 25 mg via INTRAVENOUS
  Filled 2019-08-09: qty 1

## 2019-08-09 MED ORDER — LIDOCAINE VISCOUS HCL 2 % MT SOLN
15.0000 mL | Freq: Once | OROMUCOSAL | Status: AC
  Administered 2019-08-09: 15 mL via ORAL
  Filled 2019-08-09: qty 15

## 2019-08-09 MED ORDER — MORPHINE SULFATE (PF) 4 MG/ML IV SOLN
4.0000 mg | Freq: Once | INTRAVENOUS | Status: AC
  Administered 2019-08-09: 4 mg via INTRAVENOUS
  Filled 2019-08-09: qty 1

## 2019-08-09 MED ORDER — ALUM & MAG HYDROXIDE-SIMETH 200-200-20 MG/5ML PO SUSP
30.0000 mL | Freq: Once | ORAL | Status: AC
  Administered 2019-08-09: 30 mL via ORAL
  Filled 2019-08-09: qty 30

## 2019-08-09 MED ORDER — BUPROPION HCL ER (SR) 100 MG PO TB12
100.0000 mg | ORAL_TABLET | Freq: Every day | ORAL | Status: DC
  Administered 2019-08-10: 100 mg via ORAL
  Filled 2019-08-09: qty 1

## 2019-08-09 MED ORDER — PANTOPRAZOLE SODIUM 40 MG IV SOLR
40.0000 mg | INTRAVENOUS | Status: DC
  Administered 2019-08-09: 40 mg via INTRAVENOUS
  Filled 2019-08-09: qty 40

## 2019-08-09 MED ORDER — HYDROMORPHONE HCL 1 MG/ML IJ SOLN
1.0000 mg | INTRAMUSCULAR | Status: DC | PRN
  Administered 2019-08-09 – 2019-08-10 (×3): 1 mg via INTRAVENOUS
  Filled 2019-08-09 (×4): qty 1

## 2019-08-09 MED ORDER — IOHEXOL 300 MG/ML  SOLN
100.0000 mL | Freq: Once | INTRAMUSCULAR | Status: AC | PRN
  Administered 2019-08-09: 100 mL via INTRAVENOUS

## 2019-08-09 MED ORDER — POTASSIUM CHLORIDE 10 MEQ/100ML IV SOLN
10.0000 meq | Freq: Once | INTRAVENOUS | Status: AC
  Administered 2019-08-09: 10 meq via INTRAVENOUS
  Filled 2019-08-09: qty 100

## 2019-08-09 MED ORDER — ONDANSETRON HCL 4 MG/2ML IJ SOLN
4.0000 mg | Freq: Once | INTRAMUSCULAR | Status: AC
  Administered 2019-08-09: 4 mg via INTRAVENOUS
  Filled 2019-08-09: qty 2

## 2019-08-09 MED ORDER — SODIUM CHLORIDE 0.9% FLUSH: 3.0000 mL | Freq: Once | INTRAVENOUS | Status: DC

## 2019-08-09 MED ORDER — ACETAMINOPHEN 325 MG PO TABS: 650.0000 mg | ORAL_TABLET | Freq: Four times a day (QID) | ORAL | Status: DC | PRN

## 2019-08-09 MED ORDER — ENOXAPARIN SODIUM 40 MG/0.4ML ~~LOC~~ SOLN
40.0000 mg | SUBCUTANEOUS | Status: DC
  Administered 2019-08-09: 40 mg via SUBCUTANEOUS
  Filled 2019-08-09: qty 0.4

## 2019-08-09 MED ORDER — HYDROMORPHONE HCL 1 MG/ML IJ SOLN
1.0000 mg | Freq: Once | INTRAMUSCULAR | Status: AC
  Administered 2019-08-09: 1 mg via INTRAVENOUS
  Filled 2019-08-09: qty 1

## 2019-08-09 MED ORDER — POTASSIUM CHLORIDE 2 MEQ/ML IV SOLN
INTRAVENOUS | Status: AC
  Administered 2019-08-09: via INTRAVENOUS
  Filled 2019-08-09 (×3): qty 1000

## 2019-08-09 NOTE — ED Triage Notes (Signed)
Pt c/o abdominal pain that started last night and worsened this am. Denies nausea/vomiting/diarrhea.

## 2019-08-09 NOTE — ED Provider Notes (Signed)
[facility]ENCY [facility] Provider Note   CSN: [id number] Arrival date & time: 08/09/19  0550     History   Chief Complaint Chief Complaint  Patient presents with  . Abdominal Pain    HPI Austin Jenkins is a 33 y.o. male with past medical history significant for anxiety, depression, IBS, pancreatitis with peripancreatic fluid collection, alcohol abuse presents to emergency department today with chief complaint of abdominal pain.  Onset was acute starting around 5 AM this morning, approximately 1 hour prior to arrival.  Patient states pain is located in his left upper quadrant and radiated to his epigastric area.  He describes the pain as sharp, aching, throbbing sensation.  He describes the pain as constant.  He tried taking oxycodone at home without symptom relief.  He states the pain in his epigastric area feels like his typical pancreatitis pain however he is concerned because he has never had pain in the left upper quadrant.  He reports associated nausea with nonbloody nonbilious emesis.  He has been unable to tolerate p.o. intake.  He estimates over 12 episodes of emesis since symptom onset.  His last bowel movement was last night and it was normal for him, no diarrhea, no blood in stool.  Denies any urinary frequency, gross hematuria.  Admits to passing flatus earlier this morning.  He denies any recent alcohol consumption stating that he has not drank in 5 months.  Also denies fever, chills, chest pain, shortness of breath, suspicious food intake.  Abdominal surgical history includes cholecystectomy.  [facility] and had recent appointment for follow up visit of fluid collection management x 3 days ago.  It was decided that no intervention was needed at this time and plan was for follow-up CT in a few months.  Patient also prescribed gabapentin for pain management.  He states he only took 1 pill because he did not like the way it made him  feel.   Past Medical History:  Diagnosis Date  . Alcohol abuse   . Anxiety   . Depression   . IBS (irritable bowel syndrome)   . Pancreatitis     Patient Active Problem List   Diagnosis Date Noted  . Acute alcoholic pancreatitis 10/24/2018    Past Surgical History:  Procedure Laterality Date  . CHOLECYSTECTOMY          Home Medications    Prior to Admission medications   Medication Sig Start Date End Date Taking? Authorizing Provider  ALPRAZolam Prudy Feeler(XANAX) 1 MG tablet Take 1 mg by mouth 2 (two) times daily as needed for anxiety.   Yes [provider]  buPROPion (WELLBUTRIN SR) 100 MG 12 hr tablet Take 100 mg by mouth daily. 03/28/19  Yes [provider]  ondansetron (ZOFRAN) 4 MG tablet Take 1 tablet (4 mg total) by mouth every 6 (six) hours as needed for nausea or vomiting. 04/03/19  Yes Pollina, Canary Brimhristopher J, MD  Oxycodone HCl 10 MG TABS Take 10 mg by mouth 4 (four) times daily as needed (pain).   Yes [provider]    Family History No family history on file.  Social History Social History   Tobacco Use  . Smoking status: Current Every Day Smoker    Packs/day: 0.50  . Smokeless tobacco: Current User  Substance Use Topics  . Alcohol use: Yes    Alcohol/week: 42.0 standard drinks    Types: 42 Cans of beer per week  . Drug use:  Not on file     Allergies   Other   Review of Systems Review of Systems  Constitutional: Negative for chills and fever.  HENT: Negative for congestion, rhinorrhea, sinus pressure and sore throat.   Eyes: Negative for pain and redness.  Respiratory: Negative for cough, shortness of breath and wheezing.   Cardiovascular: Negative for chest pain and palpitations.  Gastrointestinal: Positive for abdominal pain, nausea and vomiting. Negative for constipation and diarrhea.  Genitourinary: Negative for dysuria.  Musculoskeletal: Negative for arthralgias, back pain, myalgias and neck pain.  Skin: Negative for  rash and wound.  Neurological: Negative for dizziness, syncope, weakness, numbness and headaches.  Psychiatric/Behavioral: Negative for confusion.     Physical Exam Updated Vital Signs BP 115/65 (BP Location: Right Arm)   Pulse (!) 54   Temp 97.9 F (36.6 C) (Oral)   Resp 16   SpO2 100%   Physical Exam Vitals signs and nursing note reviewed.  Constitutional:      General: He is not in acute distress.    Appearance: He is not ill-appearing.  HENT:     Head: Normocephalic and atraumatic.     Right Ear: Tympanic membrane and external ear normal.     Left Ear: Tympanic membrane and external ear normal.     Nose: Nose normal.     Mouth/Throat:     Mouth: Mucous membranes are dry.     Pharynx: Oropharynx is clear.  Eyes:     General: No scleral icterus.       Right eye: No discharge.        Left eye: No discharge.     Extraocular Movements: Extraocular movements intact.     Conjunctiva/sclera: Conjunctivae normal.     Pupils: Pupils are equal, round, and reactive to light.  Neck:     Musculoskeletal: Normal range of motion.     Vascular: No JVD.  Cardiovascular:     Rate and Rhythm: Normal rate and regular rhythm.     Pulses: Normal pulses.          Radial pulses are 2+ on the right side and 2+ on the left side.     Heart sounds: Normal heart sounds.  Pulmonary:     Comments: Lungs clear to auscultation in all fields. Symmetric chest rise. No wheezing, rales, or rhonchi. Abdominal:     Comments: Abdomen is soft, non-distended.  Patient with exquisite abdominal tenderness on exam with voluntary guarding.  No rigidity. No peritoneal signs.  Normoactive bowel sounds.  Musculoskeletal: Normal range of motion.  Skin:    General: Skin is warm and dry.     Capillary Refill: Capillary refill takes less than 2 seconds.  Neurological:     Mental Status: He is oriented to person, place, and time.     GCS: GCS eye subscore is 4. GCS verbal subscore is 5. GCS motor subscore is 6.      Comments: Fluent speech, no facial droop.  Psychiatric:        Behavior: Behavior normal.      ED Treatments / Results  Labs (all labs ordered are listed, but only abnormal results are displayed) Labs Reviewed  LIPASE, BLOOD - Abnormal; Notable for the following components:      Result Value   Lipase 61 (*)    All other components within normal limits  COMPREHENSIVE METABOLIC PANEL - Abnormal; Notable for the following components:   Sodium 133 (*)    Potassium 3.0 (*)  Glucose, Bld 102 (*)    BUN <5 (*)    Calcium 8.6 (*)    Albumin 3.4 (*)    All other components within normal limits  CBC - Abnormal; Notable for the following components:   Hemoglobin 12.8 (*)    All other components within normal limits  URINALYSIS, ROUTINE W REFLEX MICROSCOPIC    EKG None  Radiology Dg Chest 2 View  Result Date: 08/07/2019 CLINICAL DATA:  Pt c/o of cp, sob and dizziness since today. EXAM: CHEST - 2 VIEW COMPARISON:  Chest radiograph 10/24/2018 FINDINGS: The heart size and mediastinal contours are within normal limits. The lungs are clear. No pneumothorax or pleural effusion. The visualized skeletal structures are unremarkable. IMPRESSION: No acute cardiopulmonary finding. Electronically Signed   By: Audie Pinto M.D.   On: 08/07/2019 22:24    Procedures Procedures (including critical care time)  Medications Ordered in ED Medications  sodium chloride flush (NS) 0.9 % injection 3 mL (has no administration in time range)  potassium chloride 10 mEq in 100 mL IVPB (has no administration in time range)  morphine 4 MG/ML injection 4 mg (4 mg Intravenous Given 08/09/19 1407)  ondansetron (ZOFRAN) injection 4 mg (4 mg Intravenous Given 08/09/19 1407)  sodium chloride 0.9 % bolus 1,000 mL (0 mLs Intravenous Stopped 08/09/19 1514)  HYDROmorphone (DILAUDID) injection 1 mg (1 mg Intravenous Given 08/09/19 1512)  promethazine (PHENERGAN) injection 25 mg (25 mg Intravenous Given 08/09/19 1558)      Initial Impression / Assessment and Plan / ED Course  I have reviewed the triage vital signs and the nursing notes.  Pertinent labs & imaging results that were available during my care of the patient were reviewed by me and considered in my medical decision making (see chart for details).  Patient presents to the ED with complaints of abdominal pain.  Patient looks to be uncomfortable however is nontoxic in appearance, in no acute distress.  He is afebrile, normotensive, no tachycardia, no hypoxia.  Labs were ordered in triage.  I viewed these results which include elevated lipase of 61.  CMP with hypokalemia of 3.0, will replete with IV potassium. Kidney function appears to be at baseline, LFTs are normal.  No leukocytosis, hemoglobin of 12.8 which is consistent with 2 days ago when it was 12.0.  UA pending.  On exam patient appears anxious.  He is exquisitely tender throughout entire abdomen with voluntary guarding, no peritoneal signs.   Patient has had multiple CTs scans in the past.  I reviewed these findings.  His most recent CT A/P was 06/30/2019 with report: 1. Overall generalized trend towards mild improvement of abdominal sequelae of pancreatitis. The rim enhancing fluid collection identified along the body and tail of pancreas measures slightly smaller today. The peripancreatic edema/inflammation extending to and involving the posterior wall the stomach appears decreased. No evidence for vascular complication. 2. No new or progressive interval findings  Pt given morphine, and dilaudid for pain and zofran and phenergan for nausea. On reassessment he reports pain has only mildly improved. Repeat abdominal exam still with generalized tenderness, less voluntary guarding compared to first exam. Had lengthy discussion with patient regarding frequent CT scans. He continues to be very concerned for as pain is different than usual. Will proceed with CT A/P.  Patient care transferred to [facility].  Fawze PA-C at the end of my shift pending CT. Patient presentation, ED course, and plan of care discussed with review of all pertinent labs and imaging. Please  see her note for further details regarding further ED course and disposition. Anticipate discharge home if toelrating PO intake, pain controlled, and no acute CT findings. Pt is well plugged in with [facility].    Portions of this note were generated with Scientist, clinical (histocompatibility and immunogenetics). Dictation errors may occur despite best attempts at proofreading.   Final Clinical Impressions(s) / ED Diagnoses   Final diagnoses:  Generalized abdominal pain  Acute on chronic pancreatitis [facility])    ED Discharge Orders    None       Kathyrn Lass 08/10/19 2203    Gerhard Munch, MD 08/12/19 1502

## 2019-08-09 NOTE — ED Notes (Signed)
Pt asked to give urine sample. Pt stated that he thinks he will be able to go in a little bit. More fluids hung

## 2019-08-09 NOTE — Progress Notes (Signed)
Austin Jenkins is a 33 y.o. male patient admitted from ED awake, alert - oriented  X 4 - no acute distress noted.  VSS - Blood pressure 102/71, pulse (!) 56, temperature 98.2 F (36.8 C), temperature source Oral, resp. rate 17, weight 60 kg, SpO2 99 %.    IV in place, occlusive dsg intact without redness.    Will cont to eval and treat per MD orders.  Vidal Schwalbe, RN 08/09/2019 10:40 PM

## 2019-08-09 NOTE — ED Notes (Signed)
Urine culture sent down to lab with urine sample 

## 2019-08-09 NOTE — ED Provider Notes (Signed)
Received patient at signout from [facility].  Refer to provider note for full history and physical examination.  Briefly patient is a 33 year old male with history of pancreatitis presenting today for evaluation of acute onset left upper quadrant abdominal pain with associated nausea and vomiting.  Has had reportedly 12 episodes of emesis prior to arrival, actively vomiting on PAs initial assessment.  Reports this feels very different from his usual pancreatitis pain so he is concerned that something else could be going on.  Pending CT of the abdomen and pelvis and reassessment for symptom management.  He has follow-up with [facility] on an outpatient basis.   Physical Exam  BP 100/67   Pulse (!) 52   Temp 97.9 F (36.6 C) (Oral)   Resp 17   SpO2 99%   Physical Exam Vitals signs and nursing note reviewed.  Constitutional:      General: He is not in acute distress.    Appearance: He is well-developed.  HENT:     Head: Normocephalic and atraumatic.  Eyes:     General:        Right eye: No discharge.        Left eye: No discharge.     Conjunctiva/sclera: Conjunctivae normal.  Neck:     Vascular: No JVD.     Trachea: No tracheal deviation.  Cardiovascular:     Rate and Rhythm: Normal rate.  Pulmonary:     Effort: Pulmonary effort is normal.  Abdominal:     General: Abdomen is flat.  Skin:    General: Skin is warm.     Findings: No erythema.  Neurological:     Mental Status: He is alert.  Psychiatric:        Behavior: Behavior normal.     ED Course/Procedures     Procedures  MDM  Ct Abdomen Pelvis W Contrast  Result Date: 08/09/2019 CLINICAL DATA:  Abdominal pain possible pancreatitis EXAM: CT ABDOMEN AND PELVIS WITH CONTRAST TECHNIQUE: Multidetector CT imaging of the abdomen and pelvis was performed using the standard protocol following bolus administration of intravenous contrast. CONTRAST:  136mL OMNIPAQUE IOHEXOL 300 MG/ML  SOLN COMPARISON:  CT 06/30/2019,  06/12/2019, 06/03/2019 FINDINGS: Lower chest: Lung bases demonstrate patchy dependent atelectasis. No acute consolidation or effusion. Normal heart size. Hepatobiliary: Status post cholecystectomy. No focal hepatic abnormality. Subcentimeter hypodensity in the left lobe of the liver, too small to further characterize. Probable focal fat infiltration near the falciform ligament. No significant biliary dilatation Pancreas: Grossly abnormal. Continued significant peripancreatic inflammatory change consistent with ongoing pancreatitis. Poorly enhancing pancreatic parenchyma of the body and tail without pancreatic gas. Focal fluid collection at the distal body and tail of the pancreas measures 4.4 by 2.1 cm, previously 4.7 x 2.9 cm. Interim development of organizing fluid collections between the pancreas and spleen and at the posterior gastric wall measuring 2.4 cm. Spleen: Normal in size without focal abnormality. Adrenals/Urinary Tract: Adrenal glands are unremarkable. Kidneys are normal, without renal calculi, focal lesion, or hydronephrosis. Bladder is unremarkable. Stomach/Bowel: Interval worsening of posterior gastric wall thickening, now measuring up to 3.1 cm with diffuse low attenuation. Edematous fluid at the gastric fundus and cardia. Duodenum is slightly thickened. Fluid filled left lower quadrant small bowel. No colon wall thickening. Negative appendix Vascular/Lymphatic: Mild aortic atherosclerosis. No aneurysm. Multiple small retroperitoneal nodes. Enlarged peripancreatic nodes measuring up to 11 mm. Reproductive: Prostate is unremarkable. Other: No free air. New free fluid in the pelvis and abdomen, small volume. Musculoskeletal: No  acute or significant osseous findings. IMPRESSION: 1. Grossly abnormal appearance of the pancreas which is diffusely enlarged and hypoattenuating. Persistent peripancreatic edema consistent with pancreatitis. Slight decreased size of dominant organized fluid collection at the  distal body and tail, though with interim development of multiple smaller organizing fluid collections along the mid pancreatic body and neck. Interval worsening of inflammatory change between the stomach and pancreas, now with developing organizing fluid collection at the posterior gastric wall measuring up to 2.4 cm. Posterior gastric wall thickening has significantly worsened. 2. Increased free fluid within the abdomen and pelvis. 3. Mild fluid-filled slightly thickened loops of left upper quadrant small bowel possibly due to reactive ileus and inflammation from pancreatitis Electronically Signed   By: Jasmine Pang M.D.   On: 08/09/2019 18:10    Patient with persistent peripancreatic edema, slight decreased size of dominant organized fluid collection though has developed multiple smaller organized fluid collections along the mid pancreatic body and neck.  Also has interval worsening of inflammatory change between the stomach and pancreas.  He was given a GI cocktail.  On reassessment he reports he has been able to tolerate one sip of water but reports pain is still quite severe and does not feel comfortable with discharge home.  Internal medicine teaching service was consulted, plan to admit for management of persistent nausea and pain related to pancreatitis.       Jeanie Sewer, PA-C 08/10/19 0045    Milagros Loll, MD 08/13/19 1150

## 2019-08-09 NOTE — ED Notes (Signed)
Patient back from CT.

## 2019-08-09 NOTE — ED Notes (Signed)
ED TO INPATIENT HANDOFF REPORT  ED Nurse Name and Phone #:  [phone]  S Name/Age/Gender Omer Jack 33 y.o. male Room/Bed: 037C/037C  Code Status   Code Status: Full Code  Home/SNF/Other Home Patient oriented to: self, place, time and situation Is this baseline? Yes   Triage Complete: Triage complete  Chief Complaint Severe Abd pain  Triage Note Pt c/o abdominal pain that started last night and worsened this am. Denies nausea/vomiting/diarrhea.   Allergies Allergies  Allergen Reactions  . Other Anaphylaxis    mayonnaise    Level of Care/Admitting Diagnosis ED Disposition    ED Disposition Condition [facility] Area: [place] [[id number]]  Level of Care: Med-Surg [16]  Covid Evaluation: Asymptomatic Screening Protocol (No Symptoms)  Diagnosis: Acute on chronic pancreatitis [facility]) [[id number]]  Admitting Physician: Bosie Helper  Attending Physician: Lucious Groves [[id number]]  Estimated length of stay: past midnight tomorrow  Certification:: I certify this patient will need inpatient services for at least 2 midnights  PT Class (Do Not Modify): Inpatient [101]  PT Acc Code (Do Not Modify): Private [1]       B Medical/Surgery History Past Medical History:  Diagnosis Date  . Alcohol abuse   . Anxiety   . Depression   . IBS (irritable bowel syndrome)   . Pancreatitis    Past Surgical History:  Procedure Laterality Date  . CHOLECYSTECTOMY       A IV Location/Drains/Wounds Patient Lines/Drains/Airways Status   Active Line/Drains/Airways    Name:   Placement date:   Placement time:   Site:   Days:   Peripheral IV 08/09/19 Right Antecubital   08/09/19    0205    Antecubital   less than 1          Intake/Output Last 24 hours  Intake/Output Summary (Last 24 hours) at 08/09/2019 2156 Last data filed at 08/09/2019 1738 Gross per 24 hour  Intake 1100 ml  Output -  Net 1100 ml    Labs/Imaging Results for orders  placed or performed during the hospital encounter of 08/09/19 (from the past 48 hour(s))  Lipase, blood     Status: Abnormal   Collection Time: 08/09/19  6:00 AM  Result Value Ref Range   Lipase 61 (H) 11 - 51 U/L    Comment: Performed at [facility] [address], [place], [place] [address]  Comprehensive metabolic panel     Status: Abnormal   Collection Time: 08/09/19  6:00 AM  Result Value Ref Range   Sodium 133 (L) 135 - 145 mmol/L   Potassium 3.0 (L) 3.5 - 5.1 mmol/L   Chloride 99 98 - 111 mmol/L   CO2 25 22 - 32 mmol/L   Glucose, Bld 102 (H) 70 - 99 mg/dL   BUN <5 (L) 6 - 20 mg/dL   Creatinine, Ser 0.77 0.61 - 1.24 mg/dL   Calcium 8.6 (L) 8.9 - 10.3 mg/dL   Total Protein 6.6 6.5 - 8.1 g/dL   Albumin 3.4 (L) 3.5 - 5.0 g/dL   AST 18 15 - 41 U/L   ALT 14 0 - 44 U/L   Alkaline Phosphatase 118 38 - 126 U/L   Total Bilirubin 0.4 0.3 - 1.2 mg/dL   GFR calc non Af Amer >60 >60 mL/min   GFR calc Af Amer >60 >60 mL/min   Anion gap 9 5 - 15    Comment: Performed at [facility], [address] [place], [place] [address]  CBC     Status: Abnormal   Collection Time: 08/09/19  6:00 AM  Result Value Ref Range   WBC 7.0 4.0 - 10.5 K/uL   RBC 4.41 4.22 - 5.81 MIL/uL   Hemoglobin 12.8 (L) 13.0 - 17.0 g/dL   HCT 60.439.0 54.039.0 - 98.152.0 %   MCV 88.4 80.0 - 100.0 fL   MCH 29.0 26.0 - 34.0 pg   MCHC 32.8 30.0 - 36.0 g/dL   RDW 19.113.0 47.811.5 - 29.515.5 %   Platelets 223 150 - 400 K/uL   nRBC 0.0 0.0 - 0.2 %    Comment: Performed at [facility], [address] [place], [place] [address]  Urinalysis, Routine w reflex microscopic     Status: Abnormal   Collection Time: 08/09/19  4:03 PM  Result Value Ref Range   Color, Urine YELLOW YELLOW   APPearance CLEAR CLEAR   Specific Gravity, Urine 1.002 (L) 1.005 - 1.030   pH 7.0 5.0 - 8.0   Glucose, UA NEGATIVE NEGATIVE mg/dL   Hgb urine dipstick NEGATIVE NEGATIVE   Bilirubin Urine NEGATIVE NEGATIVE   Ketones, ur NEGATIVE NEGATIVE  mg/dL   Protein, ur NEGATIVE NEGATIVE mg/dL   Nitrite NEGATIVE NEGATIVE   Leukocytes,Ua NEGATIVE NEGATIVE    Comment: Performed at [facility], [address] [place], [place] [address]   Dg Chest 2 View  Result Date: 08/07/2019 CLINICAL DATA:  Pt c/o of cp, sob and dizziness since today. EXAM: CHEST - 2 VIEW COMPARISON:  Chest radiograph 10/24/2018 FINDINGS: The heart size and mediastinal contours are within normal limits. The lungs are clear. No pneumothorax or pleural effusion. The visualized skeletal structures are unremarkable. IMPRESSION: No acute cardiopulmonary finding. Electronically Signed   By: Emmaline KluverNancy  Ballantyne M.D.   On: 08/07/2019 22:24   Ct Abdomen Pelvis W Contrast  Result Date: 08/09/2019 CLINICAL DATA:  Abdominal pain possible pancreatitis EXAM: CT ABDOMEN AND PELVIS WITH CONTRAST TECHNIQUE: Multidetector CT imaging of the abdomen and pelvis was performed using the standard protocol following bolus administration of intravenous contrast. CONTRAST:  100mL OMNIPAQUE IOHEXOL 300 MG/ML  SOLN COMPARISON:  CT 06/30/2019, 06/12/2019, 06/03/2019 FINDINGS: Lower chest: Lung bases demonstrate patchy dependent atelectasis. No acute consolidation or effusion. Normal heart size. Hepatobiliary: Status post cholecystectomy. No focal hepatic abnormality. Subcentimeter hypodensity in the left lobe of the liver, too small to further characterize. Probable focal fat infiltration near the falciform ligament. No significant biliary dilatation Pancreas: Grossly abnormal. Continued significant peripancreatic inflammatory change consistent with ongoing pancreatitis. Poorly enhancing pancreatic parenchyma of the body and tail without pancreatic gas. Focal fluid collection at the distal body and tail of the pancreas measures 4.4 by 2.1 cm, previously 4.7 x 2.9 cm. Interim development of organizing fluid collections between the pancreas and spleen and at the posterior gastric wall measuring 2.4 cm.  Spleen: Normal in size without focal abnormality. Adrenals/Urinary Tract: Adrenal glands are unremarkable. Kidneys are normal, without renal calculi, focal lesion, or hydronephrosis. Bladder is unremarkable. Stomach/Bowel: Interval worsening of posterior gastric wall thickening, now measuring up to 3.1 cm with diffuse low attenuation. Edematous fluid at the gastric fundus and cardia. Duodenum is slightly thickened. Fluid filled left lower quadrant small bowel. No colon wall thickening. Negative appendix Vascular/Lymphatic: Mild aortic atherosclerosis. No aneurysm. Multiple small retroperitoneal nodes. Enlarged peripancreatic nodes measuring up to 11 mm. Reproductive: Prostate is unremarkable. Other: No free air. New free fluid in the pelvis and abdomen, small  volume. Musculoskeletal: No acute or significant osseous findings. IMPRESSION: 1. Grossly abnormal appearance of the pancreas which is diffusely enlarged and hypoattenuating. Persistent peripancreatic edema consistent with pancreatitis. Slight decreased size of dominant organized fluid collection at the distal body and tail, though with interim development of multiple smaller organizing fluid collections along the mid pancreatic body and neck. Interval worsening of inflammatory change between the stomach and pancreas, now with developing organizing fluid collection at the posterior gastric wall measuring up to 2.4 cm. Posterior gastric wall thickening has significantly worsened. 2. Increased free fluid within the abdomen and pelvis. 3. Mild fluid-filled slightly thickened loops of left upper quadrant small bowel possibly due to reactive ileus and inflammation from pancreatitis Electronically Signed   By: Jasmine Pang M.D.   On: 08/09/2019 18:10    Pending Labs Unresulted Labs (From admission, onward)    Start     Ordered   08/10/19 0500  HIV Antibody (routine testing w rflx)  (HIV Antibody (Routine testing w reflex) panel)  Tomorrow morning,   R      08/09/19 2115   08/10/19 0500  Comprehensive metabolic panel  Tomorrow morning,   R     08/09/19 2115   08/10/19 0500  CBC  Tomorrow morning,   R     08/09/19 2115   08/10/19 0500  Protime-INR  Tomorrow morning,   R     08/09/19 2115   08/09/19 2036  SARS CORONAVIRUS 2 (TAT 6-24 HRS) Nasopharyngeal Nasopharyngeal Swab  (Asymptomatic/Tier 2 Patients Labs)  Once,   STAT    Question Answer Comment  Is this test for diagnosis or screening Screening   Symptomatic for COVID-19 as defined by CDC No   Hospitalized for COVID-19 No   Admitted to ICU for COVID-19 No   Previously tested for COVID-19 No   Resident in a congregate (group) care setting No   Employed in healthcare setting No      08/09/19 2036          Vitals/Pain Today's Vitals   08/09/19 1930 08/09/19 1945 08/09/19 1949 08/09/19 2102  BP:  97/72    Pulse:  60    Resp:  14    Temp:      TempSrc:      SpO2:  98%    Weight:    60 kg  PainSc: Asleep  5  5     Isolation Precautions No active isolations  Medications Medications  sodium chloride flush (NS) 0.9 % injection 3 mL (3 mLs Intravenous Not Given 08/09/19 1738)  enoxaparin (LOVENOX) injection 40 mg (has no administration in time range)  acetaminophen (TYLENOL) tablet 650 mg (has no administration in time range)    Or  acetaminophen (TYLENOL) suppository 650 mg (has no administration in time range)  buPROPion (WELLBUTRIN SR) 12 hr tablet 100 mg (has no administration in time range)  promethazine (PHENERGAN) injection 25 mg (has no administration in time range)  HYDROmorphone (DILAUDID) injection 1 mg (has no administration in time range)  morphine 4 MG/ML injection 4 mg (4 mg Intravenous Given 08/09/19 1407)  ondansetron (ZOFRAN) injection 4 mg (4 mg Intravenous Given 08/09/19 1407)  sodium chloride 0.9 % bolus 1,000 mL (0 mLs Intravenous Stopped 08/09/19 1514)  HYDROmorphone (DILAUDID) injection 1 mg (1 mg Intravenous Given 08/09/19 1512)  promethazine (PHENERGAN)  injection 25 mg (25 mg Intravenous Given 08/09/19 1558)  potassium chloride 10 mEq in 100 mL IVPB (0 mEq Intravenous Stopped 08/09/19 1738)  iohexol (OMNIPAQUE) 300  MG/ML solution 100 mL (100 mLs Intravenous Contrast Given 08/09/19 1717)  alum & mag hydroxide-simeth (MAALOX/MYLANTA) 200-200-20 MG/5ML suspension 30 mL (30 mLs Oral Given 08/09/19 1947)    And  lidocaine (XYLOCAINE) 2 % viscous mouth solution 15 mL (15 mLs Oral Given 08/09/19 1947)  sodium chloride 0.9 % bolus 1,000 mL (1,000 mLs Intravenous New Bag/Given 08/09/19 2101)    Mobility walks Low fall risk   Focused Assessments Cardiac Assessment Handoff:  Cardiac Rhythm: Sinus bradycardia No results found for: CKTOTAL, CKMB, CKMBINDEX, TROPONINI No results found for: DDIMER Does the Patient currently have chest pain? No      R Recommendations: See Admitting Provider Note  Report given to:   Additional Notes:

## 2019-08-09 NOTE — H&P (Addendum)
Date: 08/09/2019               Patient Name:  Austin Jenkins MRN: [id number]  DOB: 1986-05-25 Age / Sex: 33 y.o., male   [facility]         Medical Service: Internal Medicine Teaching Service         Attending Physician: Dr. Mikey Bussing, Marthenia Rolling, DO    First Contact: Dr. Yetta Barre Pager: [phone]  Second Contact: Dr. Dortha Schwalbe Pager: [phone]       After Hours (After 5p/  First Contact Pager: [phone]  weekends / holidays): Second Contact Pager: [phone]   Chief Complaint: Abdominal pain  History of Present Illness: Mr. Sollenberger is a 33 year old male with a past medical history significant for alcohol abuse in remission (last drink was 7 months ago), alcoholic pancreatitis (last hospitalization in January 2020), IBS, anxiety and depression who presented today after acute onset epigastric abdominal pain. The patient states that his pain started around 2:00 AM. He says it started in his left upper quadrant and migrated to the epigastric area.  The pain waxed and waned throughout the day, and was about 8/10 in severity. The patient states that he felt very nauseous and had about a dozen nonbloody vomiting episodes.  He he says he was only able to keep down small sips of water.  The patient takes oxycodone and Zofran as needed at home prescribed by his PCP, however these did not alleviate his symptoms. The patient denied having any headache, fevers, chest pain, SOB, or changes in his bowel or bladder habits. His last bowel movement was yesterday morning and was of normal consistency.  Patient does endorse unintentional 25 pound weight loss over the past several months.  In the ED, a CBC, CMP, and UA were drawn.  Patient had mild anemia, with a Hgb of 12.8.  CMP was significant for sodium of 133, and a potassium of 3.0.  Lipase was mildly elevated at 61.  CT of the abdomen was significant for peripancreatic edema, consistent with pancreatitis. Also had inflammatory changes between the stomach  and pancreas as well as a 2.4 cm fluid collection.  Meds:  Current Meds  Medication Sig  . ALPRAZolam (XANAX) 1 MG tablet Take 1 mg by mouth 2 (two) times daily as needed for anxiety.  Marland Kitchen buPROPion (WELLBUTRIN SR) 100 MG 12 hr tablet Take 100 mg by mouth daily.  . ondansetron (ZOFRAN) 4 MG tablet Take 1 tablet (4 mg total) by mouth every 6 (six) hours as needed for nausea or vomiting.  . Oxycodone HCl 10 MG TABS Take 10 mg by mouth 4 (four) times daily as needed (pain).   Allergies: Allergies as of 08/09/2019 - Review Complete 08/09/2019  Allergen Reaction Noted  . Other Anaphylaxis 08/09/2019   Past Medical History:  Diagnosis Date  . Alcohol abuse   . Anxiety   . Depression   . IBS (irritable bowel syndrome)   . Pancreatitis    Family History:  Patient states that he has diabetes, hypertension and heart disease that runs in the family.  Social History:  - Currently smokes 1/4 of cigarette packs a day - Has not drink alcohol in 7 months. Used to drink 6-8 beers daily for 3 years prior - Denies recreational drug use outside of occasional marijuana  Review of Systems: All systems were reviewed and are otherwise negative unless mentioned in the HPI.  Imaging: CT abdomen and pelvis: IMPRESSION: 1. Grossly abnormal appearance  of the pancreas which is diffusely enlarged and hypoattenuating. Persistent peripancreatic edema consistent with pancreatitis. Slight decreased size of dominant organized fluid collection at the distal body and tail, though with interim development of multiple smaller organizing fluid collections along the mid pancreatic body and neck. Interval worsening of inflammatory change between the stomach and pancreas, now with developing organizing fluid collection at the posterior gastric wall measuring up to 2.4 cm. Posterior gastric wall thickening has significantly worsened. 2. Increased free fluid within the abdomen and pelvis. 3. Mild fluid-filled  slightly thickened loops of left upper quadrant small bowel possibly due to reactive ileus and inflammation from pancreatitis  Physical Exam: Blood pressure 102/71, pulse (!) 56, temperature 98.2 F (36.8 C), temperature source Oral, resp. rate 17, weight 60 kg, SpO2 99 %.  Physical Exam Vitals signs reviewed.  Constitutional:      General: He is not in acute distress.    Appearance: Normal appearance. He is normal weight. He is not ill-appearing or toxic-appearing.  HENT:     Head: Normocephalic and atraumatic.  Eyes:     General: No scleral icterus.       Right eye: No discharge.        Left eye: No discharge.     Extraocular Movements: Extraocular movements intact.  Cardiovascular:     Rate and Rhythm: Regular rhythm. Bradycardia present.     Pulses: Normal pulses.     Heart sounds: Normal heart sounds. No murmur. No friction rub. No gallop.   Pulmonary:     Effort: Pulmonary effort is normal. No respiratory distress.     Breath sounds: Normal breath sounds. No wheezing or rales.  Abdominal:     General: Abdomen is flat. Bowel sounds are normal. There is no distension.     Palpations: There is mass.     Tenderness: There is abdominal tenderness (Epigastric tenderness). There is guarding.  Musculoskeletal:     Right lower leg: No edema.     Left lower leg: No edema.  Skin:    General: Skin is warm.  Neurological:     General: No focal deficit present.     Mental Status: He is alert and oriented to person, place, and time.  Psychiatric:        Mood and Affect: Mood normal.        Behavior: Behavior normal.    Assessment & Plan by Problem: Active Problems:   Acute on chronic pancreatitis (HCC)  In summary, Mr. Pio 33 year old male with a past medical history significant for alcohol abuse in remission (last drink was 7 months ago), alcoholic pancreatitis (last hospitalization in January 2020), IBS, anxiety and depression who presented today after acute onset  epigastric abdominal pain, most likely due to acute on chronic pancreatitis confirmed by CT scan.  #Hx IBS #Abdominal pain #Acute on chronic pancreatitis: CT findings consistent with acute pancreatitis. Patient has received 3L of fluid in the ED.  - NPO - Continue IVF. LR bolus  - Dilaudid 1 mg IV every 4 hours as needed - Phenergan 25 mg injection every 6 hours as needed for nausea - Protonix 40 mg daily  #Hypokalemia: K+ 3.0 on initial BMP -LR bolus with 40 mEq of K+ -BMP in AM  #Anxiety #Depression: Patient states he takes Xanax 0.5 mg as needed for panic attacks.  He has not had a panic attacks in several months.  He also takes Wellbutrin for depression. - Continue Wellbutrin 100 mg daily - Hold Xanax  #  FEN/GI - Diet: NPO - Fluids: Received 2L of NS.  Currently getting LR bolus   #DVT prophylaxis -Lovenox 40 mg subq injections daily  #Code status: FULL  #Dispo: Admit patient to Inpatient with expected length of stay greater than 2 midnights.  Signed: Kirt BoysAndrew Malana Eberwein, MD Internal Medicine, PGY1 Pager: [phone]  08/09/2019,11:16 PM

## 2019-08-09 NOTE — ED Notes (Signed)
Patient transported to CT 

## 2019-08-10 DIAGNOSIS — Z91018 Allergy to other foods: Secondary | ICD-10-CM

## 2019-08-10 LAB — COMPREHENSIVE METABOLIC PANEL
ALT: 12 U/L (ref 0–44)
AST: 14 U/L — ABNORMAL LOW (ref 15–41)
Albumin: 2.6 g/dL — ABNORMAL LOW (ref 3.5–5.0)
Alkaline Phosphatase: 114 U/L (ref 38–126)
Anion gap: 5 (ref 5–15)
BUN: <5 mg/dL — ABNORMAL LOW (ref 6–20)
CO2: 25 mmol/L (ref 22–32)
Calcium: 8.2 mg/dL — ABNORMAL LOW (ref 8.9–10.3)
Chloride: 109 mmol/L (ref 98–111)
Creatinine, Ser: 0.75 mg/dL (ref 0.61–1.24)
GFR calc Af Amer: >60 mL/min (ref >60)
GFR calc non Af Amer: >60 mL/min (ref >60)
Glucose, Bld: 94 mg/dL (ref 70–99)
Potassium: 3.9 mmol/L (ref 3.5–5.1)
Sodium: 139 mmol/L (ref 135–145)
Total Bilirubin: 0.6 mg/dL (ref 0.3–1.2)
Total Protein: 5 g/dL — ABNORMAL LOW (ref 6.5–8.1)

## 2019-08-10 LAB — CBC
HCT: 35.3 % — ABNORMAL LOW (ref 39.0–52.0)
Hemoglobin: 11.3 g/dL — ABNORMAL LOW (ref 13.0–17.0)
MCH: 29 pg (ref 26.0–34.0)
MCHC: 32 g/dL (ref 30.0–36.0)
MCV: 90.7 fL (ref 80.0–100.0)
Platelets: 154 10*3/uL (ref 150–400)
RBC: 3.89 MIL/uL — ABNORMAL LOW (ref 4.22–5.81)
RDW: 13.4 % (ref 11.5–15.5)
WBC: 4.8 10*3/uL (ref 4.0–10.5)
nRBC: 0 % (ref 0.0–0.2)

## 2019-08-10 LAB — PROTIME-INR
INR: 1.2 (ref 0.8–1.2)
Prothrombin Time: 15 seconds (ref 11.4–15.2)

## 2019-08-10 LAB — MAGNESIUM: Magnesium: 2 mg/dL (ref 1.7–2.4)

## 2019-08-10 LAB — PHOSPHORUS: Phosphorus: 3.6 mg/dL (ref 2.5–4.6)

## 2019-08-10 LAB — HIV ANTIBODY (ROUTINE TESTING W REFLEX): HIV Screen 4th Generation wRfx: NONREACTIVE

## 2019-08-10 LAB — SARS CORONAVIRUS 2 (TAT 6-24 HRS): SARS Coronavirus 2: NEGATIVE

## 2019-08-10 MED ORDER — LACTATED RINGERS IV SOLN: INTRAVENOUS | Status: DC

## 2019-08-10 NOTE — Discharge Summary (Signed)
Name: Austin Jenkins MRN: [id number] DOB: 06/20/1986 33 y.o. PCP: [place]  Date of Admission: 08/09/2019  5:53 AM Date of Discharge: 08/10/2019 Attending Physician: Austin Groves, DO  Discharge Diagnosis: 1. Acute on chronic pancreatitis  Discharge Medications: Allergies as of 08/10/2019      Reactions   Other Anaphylaxis   mayonnaise      Medication List    TAKE these medications   ALPRAZolam 1 MG tablet Commonly known as: XANAX Take 1 mg by mouth 2 (two) times daily as needed for anxiety.   buPROPion 100 MG 12 hr tablet Commonly known as: WELLBUTRIN SR Take 100 mg by mouth daily.   ondansetron 4 MG tablet Commonly known as: ZOFRAN Take 1 tablet (4 mg total) by mouth every 6 (six) hours as needed for nausea or vomiting.   Oxycodone HCl 10 MG Tabs Take 10 mg by mouth 4 (four) times daily as needed (pain).       Disposition and follow-up:   Mr.Austin Jenkins was discharged from [facility] in [place] condition.  At the hospital follow up visit please address:  1.  Acute on chronic pancreatitis - acutely worsening epigastric abdominal pain, with mildly elevated lipase of 61 - CT imaging with "Interval worsening of inflammatory change between the stomach and pancreas, now with developing organizing fluid collection at the posterior gastric wall measuring up to 2.4 cm." - pt requesting discharge to take care of his son at home, discussed risk/benefits and potential consequences of leaving sooner than recommended - instructed to follow-up closely with his outpatient GI provider at [facility] within the week - pt may need follow-up monitoring of pancreatic fluid collections and possible interventions vs monitoring  2.  Labs / imaging needed at time of follow-up: Continued monitoring of pancreatic fluid collections.  3.  Pending labs/ test needing follow-up: NONE  Follow-up Appointments: Follow-up [place]. Call in 2 day(s).   Why: Call to schedule a hospital follow-up with your primary care provider Contact information: [place] [address] [phone]        Hammoudi, Randell Patient, PA-C. Schedule an appointment as soon as possible for a visit in 3 day(s).   Specialty: Gastroenterology Why: Schedule a follow-up appointment with your GI doctor for the coming week. Contact information: [place] [address] [phone]           Hospital Course by problem list: 1. Mr. Austin Jenkins is a 33 year old gentleman with a PMH of alcoholic pancreatitis and alcohol use disorder now in remission, IBS, anxiety and depression who presented with acute onset epigastric abdominal pain. Lipase was 61. CT in the ED revealed findings consistent with pancreatitis, in addition to fluid collections at distal body and tail, multiple small organizing fluid collections in the mid-body and neck, and new fluid collection at the posterior gastric wall 2.4cm. Pt denied any recent alcohol use. AST/ALT and Alk Phos were not significantly elevated and pt is post-cholecystectomy. Pt was given 3L LR while in the ED, made NPO, and given dilaudid and phenergan PRN for pain and nausea respectively. His initial K was 3.0, so 69mq K were added to his 3rd LR bolus. By the next morning, pt was still NPO and receiving LR at a rate of 1041mhr. On exam, he was resting comfortably though with moderate tenderness at right upper quadrant, mild tenderness at left upper quadrant, and positive bowel sounds. Pt stated his  pain was decreased in quality from the emergency room.   The plan was to reach out to his outpatient GI provider at [facility] to discuss if any further work-up or interventions were indicated while inpatient and continue fluids, bowel rest, and pain control. However, pt notified nursing in the afternoon that he would like to be discharged home to watch his son. He was not hungry at  this time and still on IV fluids. Discussed with him that it would be recommended he stay for pain management, monitoring, and IV fluids. Explained the risks of infections, perforation, bleeding, or acutely worsening. Pt reiterated that he was feeling better than he did at presentation at wanted to return home. Pt stated he would be able to follow-up with his outpatient GI provider in the coming week and his PCP if needed. He was discharged the afternoon of 11/7 with plan for close outpatient follow-up and instructions to return if he was unable to tolerate PO intake at home or developed acutely worsening abdominal pain, nausea, or vomiting.   Discharge Vitals:   BP 92/66 (BP Location: Left Arm)   Pulse (!) 54   Temp 98.2 F (36.8 C) (Oral)   Resp 17   Wt 60 kg   SpO2 100%   BMI 18.98 kg/m   Pertinent Labs, Studies, and Procedures:  CBC Latest Ref Rng & Units 08/10/2019 08/09/2019 08/07/2019  WBC 4.0 - 10.5 K/uL 4.8 7.0 5.8  Hemoglobin 13.0 - 17.0 g/dL 11.3(L) 12.8(L) 12.0(L)  Hematocrit 39.0 - 52.0 % 35.3(L) 39.0 36.1(L)  Platelets 150 - 400 K/uL 154 223 193   BMP Latest Ref Rng & Units 08/10/2019 08/09/2019 08/07/2019  Glucose 70 - 99 mg/dL 94 102(H) 147(H)  BUN 6 - 20 mg/dL <5(L) <5(L) <5(L)  Creatinine 0.61 - 1.24 mg/dL 0.75 0.77 0.67  Sodium 135 - 145 mmol/L 139 133(L) 132(L)  Potassium 3.5 - 5.1 mmol/L 3.9 3.0(L) 3.2(L)  Chloride 98 - 111 mmol/L 109 99 99  CO2 22 - 32 mmol/L _0 Calcium 8.9 - 10.3 mg/dL 8.2(L) 8.6(L) 8.7(L)   Lab Results  Component Value Date   MG 2.0 08/09/2019   Lab Results  Component Value Date   CALCIUM 8.2 (L) 08/10/2019   PHOS 3.6 08/10/2019   Lab Results  Component Value Date   INR 1.2 08/10/2019   INR 1.1 08/07/2019   Lipase     Component Value Date/Time   LIPASE 61 (H) 08/09/2019 0600   Urinalysis    Component Value Date/Time   COLORURINE YELLOW 08/09/2019 1603   APPEARANCEUR CLEAR 08/09/2019 1603   LABSPEC 1.002 (L) 08/09/2019  1603   PHURINE 7.0 08/09/2019 1603   GLUCOSEU NEGATIVE 08/09/2019 1603   HGBUR NEGATIVE 08/09/2019 1603   BILIRUBINUR NEGATIVE 08/09/2019 1603   KETONESUR NEGATIVE 08/09/2019 1603   PROTEINUR NEGATIVE 08/09/2019 1603   NITRITE NEGATIVE 08/09/2019 1603   LEUKOCYTESUR NEGATIVE 08/09/2019 1603    CT ABDOMEN/PELVIS 08/09/2019 CLINICAL DATA:  Abdominal pain possible pancreatitis  EXAM: CT ABDOMEN AND PELVIS WITH CONTRAST  TECHNIQUE: Multidetector CT imaging of the abdomen and pelvis was performed using the standard protocol following bolus administration of intravenous contrast.  CONTRAST:  115m OMNIPAQUE IOHEXOL 300 MG/ML  SOLN  COMPARISON:  CT 06/30/2019, 06/12/2019, 06/03/2019  FINDINGS: Lower chest: Lung bases demonstrate patchy dependent atelectasis. No acute consolidation or effusion. Normal heart size.  Hepatobiliary: Status post cholecystectomy. No focal hepatic abnormality. Subcentimeter hypodensity in the left lobe of the liver, too small to  further characterize. Probable focal fat infiltration near the falciform ligament. No significant biliary dilatation  Pancreas: Grossly abnormal. Continued significant peripancreatic inflammatory change consistent with ongoing pancreatitis. Poorly enhancing pancreatic parenchyma of the body and tail without pancreatic gas. Focal fluid collection at the distal body and tail of the pancreas measures 4.4 by 2.1 cm, previously 4.7 x 2.9 cm. Interim development of organizing fluid collections between the pancreas and spleen and at the posterior gastric wall measuring 2.4 cm.  Spleen: Normal in size without focal abnormality.  Adrenals/Urinary Tract: Adrenal glands are unremarkable. Kidneys are normal, without renal calculi, focal lesion, or hydronephrosis. Bladder is unremarkable.  Stomach/Bowel: Interval worsening of posterior gastric wall thickening, now measuring up to 3.1 cm with diffuse low attenuation.  Edematous fluid at the gastric fundus and cardia. Duodenum is slightly thickened. Fluid filled left lower quadrant small bowel. No colon wall thickening. Negative appendix  Vascular/Lymphatic: Mild aortic atherosclerosis. No aneurysm. Multiple small retroperitoneal nodes. Enlarged peripancreatic nodes measuring up to 11 mm.  Reproductive: Prostate is unremarkable.  Other: No free air. New free fluid in the pelvis and abdomen, small volume.  Musculoskeletal: No acute or significant osseous findings.  IMPRESSION: 1. Grossly abnormal appearance of the pancreas which is diffusely enlarged and hypoattenuating. Persistent peripancreatic edema consistent with pancreatitis. Slight decreased size of dominant organized fluid collection at the distal body and tail, though with interim development of multiple smaller organizing fluid collections along the mid pancreatic body and neck. Interval worsening of inflammatory change between the stomach and pancreas, now with developing organizing fluid collection at the posterior gastric wall measuring up to 2.4 cm. Posterior gastric wall thickening has significantly worsened. 2. Increased free fluid within the abdomen and pelvis. 3. Mild fluid-filled slightly thickened loops of left upper quadrant small bowel possibly due to reactive ileus and inflammation from pancreatitis   Discharge Instructions: Discharge Instructions    Call MD for:  persistant nausea and vomiting   Complete by: As directed    Call MD for:  severe uncontrolled pain   Complete by: As directed    Diet - low sodium heart healthy   Complete by: As directed    Discharge instructions   Complete by: As directed    Mr. Heitman,  It was a pleasure taking care of you here at the hospital.  You were admitted because of flareup of your chronic pancreatitis.  We treated you with IV fluids and we would have wished that he stay an extra day to monitor your pain and diet.   Please follow-up with your primary care doctor and the GI doctors at [facility] as soon as possible.  Take care!   Increase activity slowly   Complete by: As directed       Signed: Ladona Horns, MD 08/10/2019, 2:04 PM   Pager: [phone]

## 2019-08-10 NOTE — Plan of Care (Signed)

## 2019-08-10 NOTE — Progress Notes (Signed)
Notified by RN that patient wants to leave the hospital. Called the pt, who stated he was told he could leave at noon today and would like to be discharged. He says his pain improved from his presentation. Pt is not hungry at this time though was able to have sips of water with his medications without abdominal pain. Explained to the pt that it is recommended he stay for continued pain management and IV fluids in addition to monitoring his abdominal pain, hemodynamic status, and functional status/ability to tolerate advancing PO intake. Discussed the risks of developing infections, bleeding, perforations, or acutely decompensating. Pt states that he understands the risk, however he is feeling better and needs to get home to his wife and son. Recommended to the pt that he continue to stay hydrated at home and slowly advance his diet from clear liquids based on clinical symptoms. Also needs to follow-up with his GI provider at [facility] sometime over the coming week. Pt states he will schedule an appointment with GI and if needed can get in to see his PCP sooner. Will discharge pt home this afternoon.

## 2019-08-10 NOTE — Progress Notes (Signed)
   Subjective: Pt seen at the bedside this morning. Awake and up in bed. Stating his abdominal pain is improved from when he presented to the ED, but is still bothersome. Pain located in the epigastric and R upper abdomen. No acute concerns at this time.  Objective:  Vital signs in last 24 hours: Vitals:   08/09/19 1945 08/09/19 2102 08/09/19 2226 08/10/19 0524  BP: 97/72  102/71 92/66  Pulse: 60  (!) 56 (!) 54  Resp: 14  17 17   Temp:   98.2 F (36.8 C) 98.2 F (36.8 C)  TempSrc:   Oral Oral  SpO2: 98%  99% 100%  Weight:  60 kg     Physical Exam Vitals signs and nursing note reviewed.  Constitutional:      General: He is not in acute distress.    Appearance: He is normal weight. He is not ill-appearing.  Pulmonary:     Effort: Pulmonary effort is normal.  Abdominal:     General: Abdomen is flat. Bowel sounds are decreased.     Palpations: Abdomen is soft.     Tenderness: There is abdominal tenderness in the epigastric area.  Skin:    General: Skin is warm and dry.  Neurological:     Mental Status: He is alert.    Assessment/Plan:  Active Problems:   Acute on chronic pancreatitis [facility])  Mr. Austin Jenkins is a 33 year old M with a significant PMH of chronic pancreatitis, alcohol abuse in remission (last drink in March), IBS, and anxiety/depression who presented for acute epigastric abdominal pain with CT imaging suggesting acute on chronic pancreatitis.  Acute on chronic pancreatitis:  Pt seen at [facility] for recurrent pancreatitis due to EtOH, most recently on 11/3. At that time, pt elected for no endoscopic intervention due to slowly improving clinical symptoms and instead choosing to monitor fluid collections. CT findings yesterday 11/6 consistent with acute pancreatitis with fluid collection at distal body and tail, multiple small organizing fluid collections in the mid-body and neck, and new fluid collection at the posterior gastric wall 2.4cm. Patient received 2L NS and 1L LR  yesterday. - will reach out to outpatient provider, Claiborne Billings Hammoudi PA, to update on clinical status and for any input regarding long term management - keep NPO - continue IVF of LR bolus at 111mL/hr - dilaudid 1mg  IV q4h hours PRN - promethazine 25mg  IV q6h PRN for nausea - pantoprazole 40mg  IV daily  Hypokalemia  K+ 3.0 on initial BMP, repeat this morning at 3.9. Received  - continue to monitor on f/u BMPs  Anxiety/Depression Takes alprazolam 0.5 mg PRN for panic attacks. - continue bupropion 100 mg daily - hold home PRN alprazolam    Diet - NPO Fluids - LR at 129mL/hr DVT prophylaxis - enoxaparin 40 mg subQ daily CODE STATUS - FULL CODE  Dispo - Anticipated discharge in approximately 2-3 day(s).   Ladona Horns, MD 08/10/2019, 11:07 AM Pager: [phone]

## 2019-08-10 NOTE — Progress Notes (Signed)
Omer Jack to be D/C'd  per MD order. Discussed with the patient and all questions fully answered.  VSS, Skin clean, dry and intact without evidence of skin break down, no evidence of skin tears noted.  IV catheter discontinued intact. Site without signs and symptoms of complications. Dressing and pressure applied.  An After Visit Summary was printed and given to the patient. Patient received prescription.  D/c education completed with patient/family including follow up instructions, medication list, d/c activities limitations if indicated, with other d/c instructions as indicated by MD - patient able to verbalize understanding, all questions fully answered.   Patient instructed to return to ED, call 911, or call MD for any changes in condition.   Patient to be escorted via [place], and D/C home via private auto.

## 2019-09-01 ENCOUNTER — Observation Stay (HOSPITAL_COMMUNITY)
Admission: EM | Admit: 2019-09-01 | Discharge: 2019-09-03 | Disposition: A | Discharge status: Home / Self Care | Payer: Medicaid Other | Attending: Internal Medicine | Admitting: Internal Medicine

## 2019-09-01 ENCOUNTER — Other Ambulatory Visit: Payer: Self-pay

## 2019-09-01 ENCOUNTER — Encounter (HOSPITAL_COMMUNITY): Payer: Self-pay

## 2019-09-01 ENCOUNTER — Emergency Department (HOSPITAL_COMMUNITY): Payer: Medicaid Other

## 2019-09-01 DIAGNOSIS — R1013 Epigastric pain: Secondary | ICD-10-CM

## 2019-09-01 DIAGNOSIS — Z9049 Acquired absence of other specified parts of digestive tract: Secondary | ICD-10-CM | POA: Diagnosis not present

## 2019-09-01 DIAGNOSIS — K859 Acute pancreatitis without necrosis or infection, unspecified: Secondary | ICD-10-CM | POA: Diagnosis present

## 2019-09-01 DIAGNOSIS — K580 Irritable bowel syndrome with diarrhea: Secondary | ICD-10-CM | POA: Diagnosis not present

## 2019-09-01 DIAGNOSIS — K861 Other chronic pancreatitis: Secondary | ICD-10-CM | POA: Insufficient documentation

## 2019-09-01 DIAGNOSIS — Z20828 Contact with and (suspected) exposure to other viral communicable diseases: Secondary | ICD-10-CM | POA: Insufficient documentation

## 2019-09-01 DIAGNOSIS — Z79899 Other long term (current) drug therapy: Secondary | ICD-10-CM | POA: Diagnosis not present

## 2019-09-01 DIAGNOSIS — K8681 Exocrine pancreatic insufficiency: Secondary | ICD-10-CM | POA: Insufficient documentation

## 2019-09-01 DIAGNOSIS — F17210 Nicotine dependence, cigarettes, uncomplicated: Secondary | ICD-10-CM | POA: Insufficient documentation

## 2019-09-01 DIAGNOSIS — F329 Major depressive disorder, single episode, unspecified: Secondary | ICD-10-CM | POA: Insufficient documentation

## 2019-09-01 DIAGNOSIS — F411 Generalized anxiety disorder: Secondary | ICD-10-CM | POA: Diagnosis not present

## 2019-09-01 DIAGNOSIS — G8929 Other chronic pain: Secondary | ICD-10-CM | POA: Diagnosis not present

## 2019-09-01 LAB — CBC
HCT: 41.7 % (ref 39.0–52.0)
Hemoglobin: 13.8 g/dL (ref 13.0–17.0)
MCH: 28.9 pg (ref 26.0–34.0)
MCHC: 33.1 g/dL (ref 30.0–36.0)
MCV: 87.2 fL (ref 80.0–100.0)
Platelets: 272 10*3/uL (ref 150–400)
RBC: 4.78 MIL/uL (ref 4.22–5.81)
RDW: 13.4 % (ref 11.5–15.5)
WBC: 7.5 10*3/uL (ref 4.0–10.5)
nRBC: 0 % (ref 0.0–0.2)

## 2019-09-01 LAB — BASIC METABOLIC PANEL
Anion gap: 9 (ref 5–15)
BUN: <5 mg/dL — ABNORMAL LOW (ref 6–20)
CO2: 24 mmol/L (ref 22–32)
Calcium: 9.2 mg/dL (ref 8.9–10.3)
Chloride: 101 mmol/L (ref 98–111)
Creatinine, Ser: 0.81 mg/dL (ref 0.61–1.24)
GFR calc Af Amer: >60 mL/min (ref >60)
GFR calc non Af Amer: >60 mL/min (ref >60)
Glucose, Bld: 111 mg/dL — ABNORMAL HIGH (ref 70–99)
Potassium: 4.2 mmol/L (ref 3.5–5.1)
Sodium: 134 mmol/L — ABNORMAL LOW (ref 135–145)

## 2019-09-01 LAB — TROPONIN I (HIGH SENSITIVITY): Troponin I (High Sensitivity): <2 ng/L (ref <18)

## 2019-09-01 NOTE — ED Triage Notes (Signed)
Pt presents with c/o CP, SOB, and abdominal pain x2 days. Pt denies sick contacts, denies fever at home. Denies NVD.

## 2019-09-02 ENCOUNTER — Emergency Department (HOSPITAL_COMMUNITY): Payer: Medicaid Other

## 2019-09-02 DIAGNOSIS — R1013 Epigastric pain: Secondary | ICD-10-CM

## 2019-09-02 DIAGNOSIS — K589 Irritable bowel syndrome without diarrhea: Secondary | ICD-10-CM | POA: Diagnosis not present

## 2019-09-02 DIAGNOSIS — K863 Pseudocyst of pancreas: Secondary | ICD-10-CM

## 2019-09-02 DIAGNOSIS — F1011 Alcohol abuse, in remission: Secondary | ICD-10-CM

## 2019-09-02 DIAGNOSIS — G4700 Insomnia, unspecified: Secondary | ICD-10-CM

## 2019-09-02 DIAGNOSIS — K860 Alcohol-induced chronic pancreatitis: Secondary | ICD-10-CM

## 2019-09-02 DIAGNOSIS — K859 Acute pancreatitis without necrosis or infection, unspecified: Secondary | ICD-10-CM | POA: Diagnosis not present

## 2019-09-02 DIAGNOSIS — F329 Major depressive disorder, single episode, unspecified: Secondary | ICD-10-CM

## 2019-09-02 DIAGNOSIS — F17210 Nicotine dependence, cigarettes, uncomplicated: Secondary | ICD-10-CM

## 2019-09-02 DIAGNOSIS — F411 Generalized anxiety disorder: Secondary | ICD-10-CM

## 2019-09-02 LAB — RAPID URINE DRUG SCREEN, HOSP PERFORMED
Amphetamines: NOT DETECTED
Barbiturates: NOT DETECTED
Benzodiazepines: POSITIVE — AB
Cocaine: NOT DETECTED
Opiates: NOT DETECTED
Tetrahydrocannabinol: POSITIVE — AB

## 2019-09-02 LAB — URINALYSIS, ROUTINE W REFLEX MICROSCOPIC
Bilirubin Urine: NEGATIVE
Glucose, UA: NEGATIVE mg/dL
Hgb urine dipstick: NEGATIVE
Ketones, ur: 20 mg/dL — AB
Leukocytes,Ua: NEGATIVE
Nitrite: NEGATIVE
Protein, ur: NEGATIVE mg/dL
Specific Gravity, Urine: 1.014 (ref 1.005–1.030)
pH: 6 (ref 5.0–8.0)

## 2019-09-02 LAB — HEPATIC FUNCTION PANEL
ALT: 14 U/L (ref 0–44)
AST: 14 U/L — ABNORMAL LOW (ref 15–41)
Albumin: 3.5 g/dL (ref 3.5–5.0)
Alkaline Phosphatase: 99 U/L (ref 38–126)
Bilirubin, Direct: <0.1 mg/dL (ref 0.0–0.2)
Total Bilirubin: 0.7 mg/dL (ref 0.3–1.2)
Total Protein: 6.3 g/dL — ABNORMAL LOW (ref 6.5–8.1)

## 2019-09-02 LAB — LIPASE, BLOOD: Lipase: 19 U/L (ref 11–51)

## 2019-09-02 LAB — LIPID PANEL
Cholesterol: 158 mg/dL (ref 0–200)
HDL: 24 mg/dL — ABNORMAL LOW (ref >40)
LDL Cholesterol: 105 mg/dL — ABNORMAL HIGH (ref 0–99)
Total CHOL/HDL Ratio: 6.6 RATIO
Triglycerides: 147 mg/dL (ref <150)
VLDL: 29 mg/dL (ref 0–40)

## 2019-09-02 LAB — TROPONIN I (HIGH SENSITIVITY): Troponin I (High Sensitivity): <2 ng/L (ref <18)

## 2019-09-02 LAB — SARS CORONAVIRUS 2 (TAT 6-24 HRS): SARS Coronavirus 2: NEGATIVE

## 2019-09-02 LAB — ETHANOL: Alcohol, Ethyl (B): <10 mg/dL (ref <10)

## 2019-09-02 MED ORDER — MORPHINE SULFATE (PF) 4 MG/ML IV SOLN
4.0000 mg | INTRAVENOUS | Status: DC | PRN
  Administered 2019-09-02 – 2019-09-03 (×3): 4 mg via INTRAVENOUS
  Filled 2019-09-02 (×3): qty 1

## 2019-09-02 MED ORDER — POTASSIUM CHLORIDE 2 MEQ/ML IV SOLN
INTRAVENOUS | Status: DC
  Administered 2019-09-02 – 2019-09-03 (×3): via INTRAVENOUS
  Filled 2019-09-02 (×6): qty 1000

## 2019-09-02 MED ORDER — IOHEXOL 300 MG/ML  SOLN
100.0000 mL | Freq: Once | INTRAMUSCULAR | Status: AC | PRN
  Administered 2019-09-02: 100 mL via INTRAVENOUS

## 2019-09-02 MED ORDER — PANTOPRAZOLE SODIUM 40 MG IV SOLR
40.0000 mg | Freq: Once | INTRAVENOUS | Status: AC
  Administered 2019-09-02: 40 mg via INTRAVENOUS
  Filled 2019-09-02: qty 40

## 2019-09-02 MED ORDER — ALPRAZOLAM 0.5 MG PO TABS
1.0000 mg | ORAL_TABLET | Freq: Two times a day (BID) | ORAL | Status: DC | PRN
  Administered 2019-09-02: 1 mg via ORAL
  Filled 2019-09-02: qty 2

## 2019-09-02 MED ORDER — ENOXAPARIN SODIUM 40 MG/0.4ML ~~LOC~~ SOLN
40.0000 mg | SUBCUTANEOUS | Status: DC
  Administered 2019-09-02 – 2019-09-03 (×2): 40 mg via SUBCUTANEOUS
  Filled 2019-09-02 (×2): qty 0.4

## 2019-09-02 MED ORDER — OXYCODONE HCL 5 MG PO TABS
10.0000 mg | ORAL_TABLET | Freq: Four times a day (QID) | ORAL | Status: DC | PRN
  Administered 2019-09-03: 10 mg via ORAL
  Filled 2019-09-02: qty 2

## 2019-09-02 MED ORDER — MORPHINE SULFATE (PF) 4 MG/ML IV SOLN
4.0000 mg | Freq: Once | INTRAVENOUS | Status: AC
  Administered 2019-09-02: 4 mg via INTRAVENOUS
  Filled 2019-09-02: qty 1

## 2019-09-02 MED ORDER — NICOTINE 14 MG/24HR TD PT24
14.0000 mg | MEDICATED_PATCH | Freq: Every day | TRANSDERMAL | Status: DC
  Administered 2019-09-02 – 2019-09-03 (×2): 14 mg via TRANSDERMAL
  Filled 2019-09-02 (×2): qty 1

## 2019-09-02 MED ORDER — BUPROPION HCL ER (SR) 100 MG PO TB12
100.0000 mg | ORAL_TABLET | Freq: Every day | ORAL | Status: DC
  Administered 2019-09-02 – 2019-09-03 (×2): 100 mg via ORAL
  Filled 2019-09-02 (×2): qty 1

## 2019-09-02 MED ORDER — ONDANSETRON HCL 4 MG PO TABS: 4.0000 mg | ORAL_TABLET | Freq: Four times a day (QID) | ORAL | Status: DC | PRN

## 2019-09-02 MED ORDER — ONDANSETRON HCL 4 MG/2ML IJ SOLN
4.0000 mg | Freq: Four times a day (QID) | INTRAMUSCULAR | Status: DC | PRN
  Administered 2019-09-02 – 2019-09-03 (×3): 4 mg via INTRAVENOUS
  Filled 2019-09-02 (×3): qty 2

## 2019-09-02 MED ORDER — ONDANSETRON HCL 4 MG/2ML IJ SOLN
4.0000 mg | Freq: Once | INTRAMUSCULAR | Status: AC
  Administered 2019-09-02: 4 mg via INTRAVENOUS
  Filled 2019-09-02: qty 2

## 2019-09-02 MED ORDER — ACETAMINOPHEN 650 MG RE SUPP: 650.0000 mg | Freq: Four times a day (QID) | RECTAL | Status: DC | PRN

## 2019-09-02 MED ORDER — PANTOPRAZOLE SODIUM 40 MG PO TBEC
40.0000 mg | DELAYED_RELEASE_TABLET | Freq: Two times a day (BID) | ORAL | Status: DC
  Administered 2019-09-02 – 2019-09-03 (×3): 40 mg via ORAL
  Filled 2019-09-02 (×3): qty 1

## 2019-09-02 MED ORDER — ACETAMINOPHEN 325 MG PO TABS: 650.0000 mg | ORAL_TABLET | Freq: Four times a day (QID) | ORAL | Status: DC | PRN

## 2019-09-02 NOTE — H&P (Signed)
Date: 09/02/2019               Patient Name:  Austin Jenkins MRN: [id number]  DOB: November 14, 1985 Age / Sex: 33 y.o., male   PCP: [facility]         Medical Service: Internal Medicine Teaching Service         Attending Physician: Dr. Mikey Bussing, Marthenia Rolling, DO    First Contact: Dr. Barbaraann Faster Pager: [phone]  Second Contact: Dr. Nedra Hai Pager: [phone]       After Hours (After 5p/  First Contact Pager: [phone]  weekends / holidays): Second Contact Pager: [phone]   Chief Complaint: Abdominal pain  History of Present Illness: Austin Jenkins is a 33 y.o male with a previous medical history of alcoholic pancreatitis and alcohol use disorder now in remission, IBS, anxiety and depression who presents with abdominal pain. History was obtained via the patient and through chart review.   Patient states that he was in his usual state of health until Thursday evening when he noticed subjective fevers and chills. This last < 24 hours and subsequently resolved. Then on Saturday evening he developed epigastric abdominal pain similar to prior episodes of his pancreatitis. He tried to modify his diet (BRAT diet) and use OTC pain medications (Tylenol and ASA) to control his symptoms. Initially these interventions controlled his pain; however, on Sunday evening his abdominal pain became persistent. He noticed worsening with any type of movement and this prompted him to come to the ED. In addition to the above symptoms he noticed some nausea but no emesis. He also noted worsening pain with deviation from the [employer] ROS is otherwise positive for chronic back pain, chronic sinus congestion, chronic cough, and diarrhea (not fatty or foul smelling). ROS is negative for recurrent bacterial infections or sinus infections, rashes, myalgias, arthralgias, changes in urine output or consistency, hematemesis, hematochezia.   In regards to his pancreatitis, he follows with Gastroenterology at [facility]. His first  case of pancreatitis was in January of 2020 and felt to be secondary to alcohol use. He has since stopped using alcohol (May 2020). He has never had elevated triglycerides, has no family history of chronic pancreatitis, is not on any medications known to cause pancreatitis, and is s/p cholecystectomy in 2012. Including his initial episode, he has been admitted 6 times for pancreatitis with associated CT findings. He reports an EGD years ago for gastritis but has never had EUS/ERCP.   Meds:  Current Meds  Medication Sig  . ALPRAZolam (XANAX) 1 MG tablet Take 1 mg by mouth 2 (two) times daily as needed for anxiety.  Marland Kitchen buPROPion (WELLBUTRIN SR) 100 MG 12 hr tablet Take 100 mg by mouth daily.  Marland Kitchen omeprazole (PRILOSEC OTC) 20 MG tablet Take 20 mg by mouth daily.  . Oxycodone HCl 10 MG TABS Take 10 mg by mouth 4 (four) times daily as needed (pain).   Allergies: Allergies as of 09/01/2019 - Review Complete 09/01/2019  Allergen Reaction Noted  . Other Anaphylaxis 08/09/2019   Past Medical History:  Diagnosis Date  . Alcohol abuse   . Anxiety   . Depression   . IBS (irritable bowel syndrome)   . Pancreatitis    Family History:  Father: + DM, CAD, GAD Mother: + DM  Social History:  Patient is currently unemployed but was previously a Production designer, theatre/television/film at [employer]. He is married and his wife is currently getting her law degree at [employer]. They have 1 child,  an 51 y.o boy. He has not used EtOH since May 2020. He smokes 1/4-1/2 PPD. He uses CBD oil for pain relief and insomnia.   Review of Systems: A complete ROS was negative except as per HPI.   Physical Exam: Blood pressure 102/72, pulse (!) 50, temperature 97.9 F (36.6 C), temperature source Oral, resp. rate 18, SpO2 97 %.  General: Well nourished male in no acute distress HENT: Normocephalic, atraumatic, moist mucus membranes Pulm: Good air movement with no wheezing or crackles  CV: RRR, no murmurs, no rubs  Abdomen: Active bowel sounds, soft,  non-distended, epigastric tenderness to palpation  Extremities: Pulses palpable in all extremities, no LE edema  Skin: Warm and dry  Neuro: Alert and oriented x 3  EKG: personally reviewed my interpretation is sinus bradycardia with normal axis and intervals. No acute change compared to prior.  CXR: personally reviewed my interpretation is good rotation, angulation, and penetration. No bony or soft tissue abnormalities. No enlargement of the cardiac silhouette, no mediastinal widening, no opacities/infiltrates.   CT Abdomen Pelvis W/Contrast 1. Resolving pancreatic and peripancreatic inflammatory changes with slightly better defined intrapancreatic pseudocysts. 2. Persistent patchy heterogeneous pancreatic enhancement with possible areas of necrosis. 3. No rim enhancing fluid collections to suggest pancreatic or peripancreatic abscess. 4. Chronic splenic vein occlusion with reconstitution and perisplenic and perigastric collaterals.  Assessment & Plan by Problem: Active Problems:   Acute on chronic pancreatitis [facility])  Austin Jenkins is a 33 y.o male with a previous medical history of alcoholic pancreatitis and alcohol use disorder now in remission, IBS, anxiety and depression who presents with 48 hours of persistent epigastric abdominal pain similar to prior episodes of pancreatitis. Labs and imaging were not consistent with acute pancreatitis; however, given his pain, nausea, and decrease ability to tolerate PO intake he was admitted for further evaluation and management.   Abdominal Pain  Recurrent Pancreatitis  - Patient presented to the ED with epigastric abdominal pain and nausea. Work-up is not consistent with the diagnosis for acute pancreatitis; however, given his pain, nausea, and decrease ability to tolerate PO intake he was admitted for further evaluation and management.  - He has been admitted 6 times in the past 11 months for pancreatitis with associated CT findings. He needs to  follow-up with GI for consideration of advanced imaging and further work-up.  - No indication for antibiotics  - No indication for pancreatic enzyme replacement currently  - Start LR + KCl at 125 cc/hr  - Oxycodone 10 mg every 6 hours PRN + Morphine 4 mg every 4 hours PRN for breakthrough pain  - Zofran 4 mg every 6 hours for nausea - Miralax daily to avoid constipation  - Start clear liquid diet and advance as tolerated.   Tobacco Use Disorder - Smokes 1/4-1/2 PPD - Nicotine replacement with 14 mg patch   Generalized Anxiety Disorder - Continue Wellbutrin and PRN Xanax  Diet: Clear liquid  VTE ppx: Lovenox  CODE STATUS: Full Code  Dispo: Admit patient to Observation with expected length of stay less than 2 midnights.  SignedIna Homes, MD 09/02/2019, 9:17 AM  Pager: [phone]

## 2019-09-02 NOTE — ED Provider Notes (Signed)
  Physical Exam  BP 101/69   Pulse (!) 51   Temp 97.9 F (36.6 C) (Oral)   Resp 18   SpO2 98%   Physical Exam Vitals signs and nursing note reviewed.  Constitutional:      General: He is not in acute distress.    Appearance: He is well-developed. He is not diaphoretic.  HENT:     Head: Normocephalic and atraumatic.  Eyes:     General: No scleral icterus.    Conjunctiva/sclera: Conjunctivae normal.  Neck:     Musculoskeletal: Normal range of motion.  Pulmonary:     Effort: Pulmonary effort is normal. No respiratory distress.  Skin:    Findings: No rash.  Neurological:     Mental Status: He is alert.     ED Course/Procedures     Procedures  MDM  Care handed off from previous provider PA [place].  Please see their note for further detail.  Briefly, patient is a 33 year old male with past medical history of alcohol abuse, IBS, chronic pancreatitis presenting to the ED with a chief complaint of abdominal pain.  Symptoms began 2 days ago.  Denies any vomiting.  No improvement noted with home medications.  Patient seen, admitted on 08/10/2019 but left during middle of admission due to childcare concerns.  He has a follow-up appointment scheduled with GI this week.  Plan is to obtain lab work, repeat CT, and reassess after pain medications.  Potential admission if symptoms or not improved, otherwise can follow-up with GI at his regularly scheduled appointment this week.  7:34 AM Patient reports persistent pain despite PPI and pain medications given. Labwork with normal lipase, LFTs and troponin. CT shows some improvement in his pancreatitic inflammation and fluid collections; persistent necrosis. Will admit to medicine service for symptomatic control of acute on chronic pancreatitis.     Delia Heady, PA-C 09/02/19 0820    Maudie Flakes, MD 09/03/19 [phone]

## 2019-09-02 NOTE — ED Provider Notes (Signed)
[facility] EMERGENCY DEPARTMENT Provider Note   CSN: [id number] Arrival date & time: 09/01/19  1951     History   Chief Complaint Chief Complaint  Patient presents with  . Chest Pain  . Shortness of Breath  . Abdominal Pain    HPI Hrishikesh Hoeg is a 33 y.o. male with a hx of cholecystectomy, Etoh abuse, anxiety, depression, IBS, gastritis, pancreatitis presents to the Emergency Department complaining of gradual, persistent, progressively worsening epigastric abd pain onset 2 days ago.  Pt reports pain was initially controlled with tylenol and ASA but it has worsened since then.  Pt reports the pain is the same as an episode of pancreatitis/gastritis 2 weeks ago and was admitted.  Pt reports he followed-up with [facility] gastroenterology.  Pt denies ongoing medications for this.  Pt reports his last EtOH intake was April 2020.  Pt denies drug use. Nothing seems to make the symptoms worse.  Pt reports he was supposed to see his PCP today in follow-up but the pain was too bad.  Pt reports associated nausea and 4 loose stools without vomiting.  Denies fever, chills, bloody stools.  Pt reports his pain is a 10/10 and stabbing without radiation.  Patient reports the severe abdominal pain causes him to feel slightly short of breath.  No dyspnea on exertion or leg swelling.  Patient reports attempting n.p.o. and slow diet progression without improvement.     The history is provided by the patient and medical records. No language interpreter was used.    Past Medical History:  Diagnosis Date  . Alcohol abuse   . Anxiety   . Depression   . IBS (irritable bowel syndrome)   . Pancreatitis     Patient Active Problem List   Diagnosis Date Noted  . Acute on chronic pancreatitis (HCC) 08/09/2019  . Acute alcoholic pancreatitis 10/24/2018    Past Surgical History:  Procedure Laterality Date  . CHOLECYSTECTOMY          Home Medications    Prior to Admission  medications   Medication Sig Start Date End Date Taking? Authorizing Provider  ALPRAZolam Prudy Feeler) 1 MG tablet Take 1 mg by mouth 2 (two) times daily as needed for anxiety.   Yes [provider]  buPROPion (WELLBUTRIN SR) 100 MG 12 hr tablet Take 100 mg by mouth daily. 03/28/19  Yes [provider]  omeprazole (PRILOSEC OTC) 20 MG tablet Take 20 mg by mouth daily.   Yes [provider]  Oxycodone HCl 10 MG TABS Take 10 mg by mouth 4 (four) times daily as needed (pain).   Yes [provider]  ondansetron (ZOFRAN) 4 MG tablet Take 1 tablet (4 mg total) by mouth every 6 (six) hours as needed for nausea or vomiting. Patient not taking: Reported on 09/02/2019 04/03/19   Gilda Crease, MD    Family History No family history on file.  Social History Social History   Tobacco Use  . Smoking status: Current Every Day Smoker    Packs/day: 0.50  . Smokeless tobacco: Current User  Substance Use Topics  . Alcohol use: Not Currently    Alcohol/week: 42.0 standard drinks    Types: 42 Cans of beer per week    Comment: pt states he has not had a drinik since april  . Drug use: Not on file     Allergies   Other   Review of Systems Review of Systems  Constitutional: Negative for appetite change, diaphoresis, fatigue,  fever and unexpected weight change.  HENT: Negative for mouth sores.   Eyes: Negative for visual disturbance.  Respiratory: Positive for shortness of breath. Negative for cough, chest tightness and wheezing.   Cardiovascular: Positive for chest pain.  Gastrointestinal: Positive for abdominal pain and diarrhea. Negative for constipation, nausea and vomiting.  Endocrine: Negative for polydipsia, polyphagia and polyuria.  Genitourinary: Negative for dysuria, frequency, hematuria and urgency.  Musculoskeletal: Negative for back pain and neck stiffness.  Skin: Negative for rash.  Allergic/Immunologic: Negative for immunocompromised state.   Neurological: Negative for syncope, light-headedness and headaches.  Hematological: Does not bruise/bleed easily.  Psychiatric/Behavioral: Negative for sleep disturbance. The patient is not nervous/anxious.      Physical Exam Updated Vital Signs BP 112/70   Pulse (!) 48   Temp 97.9 F (36.6 C) (Oral)   Resp 18   SpO2 99%   Physical Exam Vitals signs and nursing note reviewed.  Constitutional:      General: He is not in acute distress.    Appearance: He is not diaphoretic.  HENT:     Head: Normocephalic.  Eyes:     General: No scleral icterus.    Conjunctiva/sclera: Conjunctivae normal.  Neck:     Musculoskeletal: Normal range of motion.  Cardiovascular:     Rate and Rhythm: Normal rate and regular rhythm.     Pulses: Normal pulses.          Radial pulses are 2+ on the right side and 2+ on the left side.  Pulmonary:     Effort: No tachypnea, accessory muscle usage, prolonged expiration, respiratory distress or retractions.     Breath sounds: No stridor.     Comments: Equal chest rise. No increased work of breathing. Abdominal:     General: There is no distension.     Palpations: Abdomen is soft.     Tenderness: There is abdominal tenderness in the epigastric area. There is guarding. There is no right CVA tenderness, left CVA tenderness or rebound.  Musculoskeletal:     Comments: Moves all extremities equally and without difficulty.  Skin:    General: Skin is warm and dry.     Capillary Refill: Capillary refill takes less than 2 seconds.  Neurological:     Mental Status: He is alert.     GCS: GCS eye subscore is 4. GCS verbal subscore is 5. GCS motor subscore is 6.     Comments: Speech is clear and goal oriented.  Psychiatric:        Mood and Affect: Mood normal.      ED Treatments / Results  Labs (all labs ordered are listed, but only abnormal results are displayed) Labs Reviewed  BASIC METABOLIC PANEL - Abnormal; Notable for the following components:       Result Value   Sodium 134 (*)    Glucose, Bld 111 (*)    BUN <5 (*)    All other components within normal limits  SARS CORONAVIRUS 2 (TAT 6-24 HRS)  CBC  LIPASE, BLOOD  ETHANOL  RAPID URINE DRUG SCREEN, HOSP PERFORMED  URINALYSIS, ROUTINE W REFLEX MICROSCOPIC  HEPATIC FUNCTION PANEL  TROPONIN I (HIGH SENSITIVITY)  TROPONIN I (HIGH SENSITIVITY)    EKG EKG Interpretation  Date/Time:  Sunday September 01 2019 20:09:30 EST Ventricular Rate:  59 PR Interval:  128 QRS Duration: 92 QT Interval:  418 QTC Calculation: 413 R Axis:   82 Text Interpretation: Sinus bradycardia Otherwise normal ECG When compared with ECG of 08/09/2019,  No significant change was found Confirmed by Dione BoozeGlick, David ([id number]) on 09/02/2019 12:58:08 AM   Radiology Dg Chest 2 View  Result Date: 09/01/2019 CLINICAL DATA:  33 year old male with chest pain shortness of breath and abdominal pain for 2 days. Denies fever or sick contacts. Recurrent pancreatitis earlier this month. EXAM: CHEST - 2 VIEW COMPARISON:  CT Abdomen and Pelvis 08/09/2019. chest radiographs 08/07/2019 and earlier. FINDINGS: Lung volumes and mediastinal contours remain normal. Visualized tracheal air column is within normal limits. The lungs appear stable and clear. No pleural effusion. Stable visible bowel gas pattern. Stable cholecystectomy clips. No acute osseous abnormality identified. IMPRESSION: No acute cardiopulmonary abnormality. Electronically Signed   By: Odessa FlemingH  Hall M.D.   On: 09/01/2019 20:30    Procedures Procedures (including critical care time)  Medications Ordered in ED Medications  morphine 4 MG/ML injection 4 mg (4 mg Intravenous Given 09/02/19 0625)  ondansetron (ZOFRAN) injection 4 mg (4 mg Intravenous Given 09/02/19 0625)  pantoprazole (PROTONIX) injection 40 mg (40 mg Intravenous Given 09/02/19 0625)  iohexol (OMNIPAQUE) 300 MG/ML solution 100 mL (100 mLs Intravenous Contrast Given 09/02/19 [id number])     Initial Impression /  Assessment and Plan / ED Course  I have reviewed the triage vital signs and the nursing notes.  Pertinent labs & imaging results that were available during my care of the patient were reviewed by me and considered in my medical decision making (see chart for details).        Presents with abdominal pain and chest pain.  Patient has a history of pancreatitis.  Records reviewed.  He was hospitalized on August 09, 2019 for acute on chronic pancreatitis.  At that time he had a CT scan showing significant peripancreatic inflammatory changes with focal fluid collection at the distal body and tail and organizing fluid collections between the pancreas and spleen.  Record shows that patient was discharged on 08/10/2019 due to childcare issues without intervention of the fluid collection.  Patient today with severe pain.  He is afebrile and without tachycardia however I am concerned about worsening fluid collection and potential necrosis versus abscess.  No leukocytosis.  Additional labs and CT scan pending.  I suspect patient will need admission.  6:44 AM At shift change [facility] who will follow pending studies, re-evaulate and determine disposition.     Final Clinical Impressions(s) / ED Diagnoses   Final diagnoses:  Epigastric pain  Acute on chronic pancreatitis [facility](HCC)    ED Discharge Orders    None       Milta DeitersMuthersbaugh, Myleah Cavendish, PA-C 09/02/19 [id number]    Dione BoozeGlick, David, MD 09/02/19 [phone]

## 2019-09-02 NOTE — ED Notes (Signed)
Send message to dr about pts soft BP and amt of morphine to give pt ( 2 instead of 4 ) because , ordered fluids to be sent to 5 c

## 2019-09-02 NOTE — Progress Notes (Signed)
Pt called RN into the room and stated that he still felt lightheaded with some dizziness and now is shaking a little bit. Pt blood pressure increased from 91/59 to 99/67 with fluids running. RN paged MD awaiting call back.

## 2019-09-02 NOTE — Progress Notes (Signed)
PT blood pressure was 91/59 and stated that he felt a little lightheaded, RN notified Internal Medicine team and MD stated that we will continue to monitor the patient. IV fluids running will recheck BP

## 2019-09-03 LAB — COMPREHENSIVE METABOLIC PANEL
ALT: 12 U/L (ref 0–44)
AST: 12 U/L — ABNORMAL LOW (ref 15–41)
Albumin: 3.4 g/dL — ABNORMAL LOW (ref 3.5–5.0)
Alkaline Phosphatase: 99 U/L (ref 38–126)
Anion gap: 9 (ref 5–15)
BUN: 5 mg/dL — ABNORMAL LOW (ref 6–20)
CO2: 25 mmol/L (ref 22–32)
Calcium: 8.9 mg/dL (ref 8.9–10.3)
Chloride: 104 mmol/L (ref 98–111)
Creatinine, Ser: 0.96 mg/dL (ref 0.61–1.24)
GFR calc Af Amer: >60 mL/min (ref >60)
GFR calc non Af Amer: >60 mL/min (ref >60)
Glucose, Bld: 89 mg/dL (ref 70–99)
Potassium: 4.2 mmol/L (ref 3.5–5.1)
Sodium: 138 mmol/L (ref 135–145)
Total Bilirubin: 0.9 mg/dL (ref 0.3–1.2)
Total Protein: 6 g/dL — ABNORMAL LOW (ref 6.5–8.1)

## 2019-09-03 LAB — CBC
HCT: 39.4 % (ref 39.0–52.0)
Hemoglobin: 13.1 g/dL (ref 13.0–17.0)
MCH: 29.1 pg (ref 26.0–34.0)
MCHC: 33.2 g/dL (ref 30.0–36.0)
MCV: 87.6 fL (ref 80.0–100.0)
Platelets: 200 10*3/uL (ref 150–400)
RBC: 4.5 MIL/uL (ref 4.22–5.81)
RDW: 13.6 % (ref 11.5–15.5)
WBC: 6.4 10*3/uL (ref 4.0–10.5)
nRBC: 0 % (ref 0.0–0.2)

## 2019-09-03 NOTE — Plan of Care (Signed)
  Problem: Education: Goal: Knowledge of Pancreatitis treatment and prevention will improve Outcome: Completed/Met   Problem: Health Behavior/Discharge Planning: Goal: Ability to formulate a plan to maintain an alcohol-free life will improve Outcome: Completed/Met   Problem: Nutritional: Goal: Ability to achieve adequate nutritional intake will improve Outcome: Completed/Met   Problem: Clinical Measurements: Goal: Complications related to the disease process, condition or treatment will be avoided or minimized Outcome: Completed/Met

## 2019-09-03 NOTE — Discharge Summary (Signed)
Name: Austin Jenkins MRN: [id number] DOB: Sep 19, 1986 33 y.o. [facility]  Date of Admission: 09/01/2019  7:59 PM Date of Discharge: 09/03/2019 Attending Physician: Dr. Rogelia Boga  Discharge Diagnosis: 1. Acute Pancreatitis   Discharge Medications: Allergies as of 09/03/2019      Reactions   Other Anaphylaxis   mayonnaise      Medication List    TAKE these medications   ALPRAZolam 1 MG tablet Commonly known as: XANAX Take 1 mg by mouth 2 (two) times daily as needed for anxiety.   buPROPion 100 MG 12 hr tablet Commonly known as: WELLBUTRIN SR Take 100 mg by mouth daily.   omeprazole 20 MG tablet Commonly known as: PRILOSEC OTC Take 20 mg by mouth daily.   ondansetron 4 MG tablet Commonly known as: ZOFRAN Take 1 tablet (4 mg total) by mouth every 6 (six) hours as needed for nausea or vomiting.   Oxycodone HCl 10 MG Tabs Take 10 mg by mouth 4 (four) times daily as needed (pain).       Disposition and follow-up:   Mr.Austin Jenkins was discharged from [facility] in Stable condition.  At the hospital follow up visit please address:  1.  Acute Pancreatitis: please make sure patients pain continues to resolve and nausea is well controlled. Patient will need follow up with GI to perform a fecal elastase test given his history of multiple episodes of pancreatitis with on going symptoms between acute flares.   2.  Labs / imaging needed at time of follow-up: Fecal elastase   3.  Pending labs/ test needing follow-up: none  Follow-up Appointments:  - Patient has follow-up with PCP scheduled for today 09/03/2019 - He will need a follow up with GI.   Hospital Course by problem list: 1. Acute Pancreatitis:  Patient presented to [facility] on 11/30 with epigastric abdominal pain and nausea without emesis 2/2 to acute pancreatitis.  The patient has a significant history of alcohol use disorder. Patient admits to multiple episodes of acute  pancreatitis 2/2 to ETOH since January of 2020. He states that he coninues to have residual abdominal pain and GI symptoms between episodes of acute pancreatitis that he believes is secondary to IBS.  Difficult to discern whether or not the patient has had IBS related symptoms prior to the onset of these acute pancreatitis episodes.  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  Patient was treated with conservative management with IV fluids and pain medicine and his diet was progressed slowly.  This morning he was able to tolerate breakfast without nausea or vomiting.  He states that he had some abdominal pain, but he believes this is his baseline.  Patient was deemed stable for discharge today.  He was scheduled for follow-up at his primary care physician this afternoon for refills of his pain medication, Zofran, and depression/anxiety medication.   Discharge Vitals:   BP 108/71 (BP Location: Right Arm)   Pulse (!) 52   Temp 98.5 F (36.9 C) (Oral)   Resp 16   SpO2 98%   Pertinent Labs, Studies, and Procedures:  CBC Latest Ref Rng & Units 09/03/2019 09/01/2019 08/10/2019  WBC 4.0 - 10.5 K/uL 6.4 7.5 4.8  Hemoglobin 13.0 - 17.0 g/dL 26.7 12.4 11.3(L)  Hematocrit 39.0 - 52.0 % 39.4 41.7 35.3(L)  Platelets 150 - 400 K/uL 200 272 154   CMP Latest Ref Rng & Units 09/03/2019 09/02/2019 09/01/2019  Glucose 70 - 99 mg/dL 89 -  111(H)  BUN 6 - 20 mg/dL 5(L) - <5(L)  Creatinine 0.61 - 1.24 mg/dL 0.96 - 0.81  Sodium 135 - 145 mmol/L 138 - 134(L)  Potassium 3.5 - 5.1 mmol/L 4.2 - 4.2  Chloride 98 - 111 mmol/L 104 - 101  CO2 22 - 32 mmol/L 25 - 24  Calcium 8.9 - 10.3 mg/dL 8.9 - 9.2  Total Protein 6.5 - 8.1 g/dL 6.0(L) 6.3(L) -  Total Bilirubin 0.3 - 1.2 mg/dL 0.9 0.7 -  Alkaline Phos 38 - 126 U/L 99 99 -  AST 15 - 41 U/L 12(L) 14(L) -  ALT 0 - 44 U/L 12 14 -   Lipase 19  Discharge Instructions: Discharge Instructions    Diet - low sodium heart healthy    Complete by: As directed    Discharge instructions   Complete by: As directed    You were hospitalized for acute pancreatitis. Thank you for allowing [place] to be part of your care.   We arranged for you to follow up at:   - You have a follow up appointment with your PCP scheduled for today. - Please follow up with your gastroenterologist soon as possible as well.   Please note these changes made to your medications:   - Please continue all out patient medications as prescribed.    Please make sure to follow up with your PCP and GI   Please call our clinic if you have any questions or concerns, we may be able to help and keep you from a long and expensive emergency room wait. Our clinic and after hours phone number is [phone], the best time to call is Monday through Friday 9 am to 4 pm but there is always someone available 24/7 if you have an emergency. If you need medication refills please notify your pharmacy one week in advance and they will send [place] a request.   Increase activity slowly   Complete by: As directed       Signed: Marianna Payment, MD 09/03/2019, 3:34 PM   Pager: [phone]

## 2019-09-03 NOTE — Progress Notes (Signed)
Patient is discharged home with family, discharge instruction given to patient. Patient verbalized understanding.

## 2019-09-03 NOTE — Progress Notes (Signed)
Subjective: Patient states that he is doing well. Able to tolerate his breakfast with only mild pain. He states that this is normal and has a prescription or oxycodone for chronic abdominal pain after eating that occurs several times per week. He states that the pain is worse with eating fatty meals and occassionally has loose/greasy stools. He denies chest pain, shortness of breath, nausea/vomiting, diarrhea.   Objective:  Vital signs in last 24 hours: Vitals:   09/02/19 1440 09/02/19 1726 09/02/19 2337 09/03/19 0539  BP: 99/67 109/64 (!) 105/59 102/66  Pulse: (!) 56 85 (!) 56 (!) 50  Resp:  16 18 18   Temp:  98.3 F (36.8 C) 98.2 F (36.8 C) 97.6 F (36.4 C)  TempSrc:  Oral Oral Oral  SpO2:  100% 100% 100%    Physical Exam: Physical Exam  Constitutional: He is oriented to person, place, and time and well-developed, well-nourished, and in no distress.  HENT:  Head: Atraumatic.  Eyes: EOM are normal.  Neck: Normal range of motion.  Cardiovascular: Regular rhythm, normal heart sounds and intact distal pulses. Bradycardia present. Exam reveals no gallop and no friction rub.  No murmur heard. Pulmonary/Chest: Effort normal and breath sounds normal. No respiratory distress. He exhibits no tenderness.  Abdominal: Soft. He exhibits no distension. There is abdominal tenderness (mild).  Musculoskeletal: Normal range of motion.        General: No tenderness or edema.  Neurological: He is alert and oriented to person, place, and time.      Pertinent labs/Imaging: CBC Latest Ref Rng & Units 09/03/2019 09/01/2019 08/10/2019  WBC 4.0 - 10.5 K/uL 6.4 7.5 4.8  Hemoglobin 13.0 - 17.0 g/dL 13.1 13.8 11.3(L)  Hematocrit 39.0 - 52.0 % 39.4 41.7 35.3(L)  Platelets 150 - 400 K/uL 200 272 154   CMP Latest Ref Rng & Units 09/03/2019 09/02/2019 09/01/2019  Glucose 70 - 99 mg/dL 89 - 111(H)  BUN 6 - 20 mg/dL 5(L) - <5(L)  Creatinine 0.61 - 1.24 mg/dL 0.96 - 0.81  Sodium 135 - 145 mmol/L 138 -  134(L)  Potassium 3.5 - 5.1 mmol/L 4.2 - 4.2  Chloride 98 - 111 mmol/L 104 - 101  CO2 22 - 32 mmol/L 25 - 24  Calcium 8.9 - 10.3 mg/dL 8.9 - 9.2  Total Protein 6.5 - 8.1 g/dL 6.0(L) 6.3(L) -  Total Bilirubin 0.3 - 1.2 mg/dL 0.9 0.7 -  Alkaline Phos 38 - 126 U/L 99 99 -  AST 15 - 41 U/L 12(L) 14(L) -  ALT 0 - 44 U/L 12 14 -     Assessment/Plan:  Active Problems:   Acute on chronic pancreatitis (HCC)   Epigastric pain    Patient Summary: Austin Jenkins is a 33 y.o male with a pertinent past medical history of pancreatitis, IBD, depression/anxiety and alcohol use disorder who presents with signs and symptoms concerning for acute pancreatitis secondary to EtOH.   Abdominal Pain  Recurrent Pancreatitis  -Difficult to discern whether or not the patient has acute on chronic pancreatitis.  He states that he has residual abdominal pain after acute flares of pancreatitis requiring opioids pain management.  Patient does have this pain at least 2 times a week and is associated with increased p.o. intake of fatty foods.  Patient occasionally has loose/greasy stools as well. -Patient would benefit from further evaluation and management from his gastroenterologist and possible pancreatic enzyme therapy moving forward. -Patient is clinically stable for discharge today.  He has an appointment set  up with his primary care physician today in order to refill his prescription for oxycodone, Zofran, Wellbutrin.  Generalized Anxiety Disorder -Takes Wellbutrin and Xanax as needed   Diet: normal IVF: LR VTE: enoxaparin Code: full  Dispo: Anticipated discharge today with close follow up with his PCP and GI physician.   Dellia Cloud, MD 09/03/2019, 6:30 AM Pager: [phone]

## 2019-09-05 ENCOUNTER — Emergency Department (HOSPITAL_COMMUNITY)
Admission: EM | Admit: 2019-09-05 | Discharge: 2019-09-05 | Disposition: A | Discharge status: Home / Self Care | Payer: Medicaid Other | Attending: Emergency Medicine | Admitting: Emergency Medicine

## 2019-09-05 ENCOUNTER — Other Ambulatory Visit: Payer: Self-pay

## 2019-09-05 ENCOUNTER — Emergency Department (HOSPITAL_COMMUNITY): Payer: Medicaid Other

## 2019-09-05 ENCOUNTER — Encounter (HOSPITAL_COMMUNITY): Payer: Self-pay | Admitting: Emergency Medicine

## 2019-09-05 DIAGNOSIS — R112 Nausea with vomiting, unspecified: Secondary | ICD-10-CM | POA: Diagnosis not present

## 2019-09-05 DIAGNOSIS — R42 Dizziness and giddiness: Secondary | ICD-10-CM | POA: Diagnosis not present

## 2019-09-05 DIAGNOSIS — R531 Weakness: Secondary | ICD-10-CM | POA: Diagnosis not present

## 2019-09-05 DIAGNOSIS — F17210 Nicotine dependence, cigarettes, uncomplicated: Secondary | ICD-10-CM | POA: Diagnosis not present

## 2019-09-05 DIAGNOSIS — R1013 Epigastric pain: Secondary | ICD-10-CM | POA: Diagnosis present

## 2019-09-05 DIAGNOSIS — Z79899 Other long term (current) drug therapy: Secondary | ICD-10-CM | POA: Diagnosis not present

## 2019-09-05 DIAGNOSIS — R5383 Other fatigue: Secondary | ICD-10-CM | POA: Insufficient documentation

## 2019-09-05 LAB — COMPREHENSIVE METABOLIC PANEL
ALT: 11 U/L (ref 0–44)
AST: 13 U/L — ABNORMAL LOW (ref 15–41)
Albumin: 3.9 g/dL (ref 3.5–5.0)
Alkaline Phosphatase: 97 U/L (ref 38–126)
Anion gap: 10 (ref 5–15)
BUN: <5 mg/dL — ABNORMAL LOW (ref 6–20)
CO2: 24 mmol/L (ref 22–32)
Calcium: 8.6 mg/dL — ABNORMAL LOW (ref 8.9–10.3)
Chloride: 95 mmol/L — ABNORMAL LOW (ref 98–111)
Creatinine, Ser: 0.65 mg/dL (ref 0.61–1.24)
GFR calc Af Amer: >60 mL/min (ref >60)
GFR calc non Af Amer: >60 mL/min (ref >60)
Glucose, Bld: 112 mg/dL — ABNORMAL HIGH (ref 70–99)
Potassium: 3.5 mmol/L (ref 3.5–5.1)
Sodium: 129 mmol/L — ABNORMAL LOW (ref 135–145)
Total Bilirubin: 0.7 mg/dL (ref 0.3–1.2)
Total Protein: 6.5 g/dL (ref 6.5–8.1)

## 2019-09-05 LAB — CBC
HCT: 36.5 % — ABNORMAL LOW (ref 39.0–52.0)
Hemoglobin: 12.4 g/dL — ABNORMAL LOW (ref 13.0–17.0)
MCH: 28.8 pg (ref 26.0–34.0)
MCHC: 34 g/dL (ref 30.0–36.0)
MCV: 84.9 fL (ref 80.0–100.0)
Platelets: 210 10*3/uL (ref 150–400)
RBC: 4.3 MIL/uL (ref 4.22–5.81)
RDW: 13.4 % (ref 11.5–15.5)
WBC: 7.7 10*3/uL (ref 4.0–10.5)
nRBC: 0 % (ref 0.0–0.2)

## 2019-09-05 LAB — LIPASE, BLOOD: Lipase: 28 U/L (ref 11–51)

## 2019-09-05 LAB — URINALYSIS, ROUTINE W REFLEX MICROSCOPIC
Bilirubin Urine: NEGATIVE
Glucose, UA: NEGATIVE mg/dL
Hgb urine dipstick: NEGATIVE
Ketones, ur: NEGATIVE mg/dL
Leukocytes,Ua: NEGATIVE
Nitrite: NEGATIVE
Protein, ur: NEGATIVE mg/dL
Specific Gravity, Urine: 1 — ABNORMAL LOW (ref 1.005–1.030)
pH: 7 (ref 5.0–8.0)

## 2019-09-05 LAB — TROPONIN I (HIGH SENSITIVITY): Troponin I (High Sensitivity): <2 ng/L (ref <18)

## 2019-09-05 MED ORDER — ONDANSETRON 4 MG PO TBDP: 4.0000 mg | ORAL_TABLET | Freq: Three times a day (TID) | ORAL | 0 refills | Status: DC | PRN

## 2019-09-05 MED ORDER — SODIUM CHLORIDE 0.9% FLUSH: 3.0000 mL | Freq: Once | INTRAVENOUS | Status: DC

## 2019-09-05 MED ORDER — ONDANSETRON HCL 4 MG/2ML IJ SOLN
4.0000 mg | Freq: Once | INTRAMUSCULAR | Status: AC
  Administered 2019-09-05: 08:00:00 4 mg via INTRAVENOUS
  Filled 2019-09-05: qty 2

## 2019-09-05 MED ORDER — ALUM & MAG HYDROXIDE-SIMETH 200-200-20 MG/5ML PO SUSP
30.0000 mL | Freq: Once | ORAL | Status: AC
  Administered 2019-09-05: 08:00:00 30 mL via ORAL
  Filled 2019-09-05: qty 30

## 2019-09-05 MED ORDER — LIDOCAINE VISCOUS HCL 2 % MT SOLN
15.0000 mL | Freq: Once | OROMUCOSAL | Status: AC
  Administered 2019-09-05: 08:00:00 15 mL via ORAL
  Filled 2019-09-05: qty 15

## 2019-09-05 MED ORDER — SODIUM CHLORIDE 0.9 % IV BOLUS
1000.0000 mL | Freq: Once | INTRAVENOUS | Status: AC
  Administered 2019-09-05: 07:00:00 1000 mL via INTRAVENOUS

## 2019-09-05 MED ORDER — PANTOPRAZOLE SODIUM 40 MG IV SOLR
40.0000 mg | Freq: Once | INTRAVENOUS | Status: AC
  Administered 2019-09-05: 07:00:00 40 mg via INTRAVENOUS
  Filled 2019-09-05: qty 40

## 2019-09-05 MED ORDER — DICYCLOMINE HCL 10 MG PO CAPS
10.0000 mg | ORAL_CAPSULE | Freq: Once | ORAL | Status: AC
  Administered 2019-09-05: 08:00:00 10 mg via ORAL
  Filled 2019-09-05: qty 1

## 2019-09-05 MED ORDER — DICYCLOMINE HCL 20 MG PO TABS: 20.0000 mg | ORAL_TABLET | Freq: Two times a day (BID) | ORAL | 0 refills | Status: DC | PRN

## 2019-09-05 MED ORDER — OXYCODONE-ACETAMINOPHEN 5-325 MG PO TABS: 1.0000 | ORAL_TABLET | Freq: Once | ORAL | Status: DC

## 2019-09-05 NOTE — Discharge Instructions (Addendum)
Continue taking home medications as prescribed.  Stay with a clear liquid diet and slowly progress to solid foods.  Call your stomach doctor for further evaluation and management.  Use bentyl as needed for stomach pain/spasm.  Use zofran as needed for nausea. Return to the ER if you develop high fevers, persistent vomiting, or any new, worsening, or concerning symptoms.

## 2019-09-05 NOTE — ED Provider Notes (Signed)
[facility] EMERGENCY DEPARTMENT Provider Note   CSN: [id number] Arrival date & time: 09/05/19  1245     History   Chief Complaint Chief Complaint  Patient presents with  . Chest Pain  . Abdominal Pain    HPI Austin Jenkins is a 33 y.o. male resenting for evaluation of abdominal pain, nausea, vomiting.  Patient states yesterday he developed severe epigastric abdominal pain which radiates to his chest.  He has associated fatigue, weakness, dizziness.  Patient states he has vomited 10-11 times in the last 24 hours.  No hematemesis.  He also reports constipation, but states he did have a bowel movement yesterday, which was hard and small.  He was recently diagnosed with pancreatitis, admitted to the hospital.  He was discharged 2 days ago, was feeling better at that time.  He denies associated fevers, chills, cough, any urinary symptoms.  He has a history of frequent pancreatitis, IBS, history of alcohol abuse (pt states he quit 8 months ago).  History of cholecystectomy, no other abdominal surgeries.  He is not currently on anything for IBS.  He takes an antacid daily, does not know the name.  Patient states he has a prescription for oxycodone that he uses as needed for pain, states he has not had any in several days.  He reports mild tobacco use, denies drug use.     HPI  Past Medical History:  Diagnosis Date  . Alcohol abuse   . Anxiety   . Depression   . IBS (irritable bowel syndrome)   . Pancreatitis     Patient Active Problem List   Diagnosis Date Noted  . Epigastric pain   . Acute on chronic pancreatitis ([place]) 08/09/2019  . Acute alcoholic pancreatitis 80/99/8338    Past Surgical History:  Procedure Laterality Date  . CHOLECYSTECTOMY          Home Medications    Prior to Admission medications   Medication Sig Start Date End Date Taking? Authorizing Provider  ALPRAZolam Duanne Moron) 1 MG tablet Take 1 mg by mouth 2 (two) times daily as needed for  anxiety.    [provider]  buPROPion (WELLBUTRIN SR) 100 MG 12 hr tablet Take 100 mg by mouth daily. 03/28/19   [provider]  dicyclomine (BENTYL) 20 MG tablet Take 1 tablet (20 mg total) by mouth 2 (two) times daily as needed for spasms. 09/05/19   Lashunta Frieden, PA-C  omeprazole (PRILOSEC OTC) 20 MG tablet Take 20 mg by mouth daily.    [provider]  ondansetron (ZOFRAN ODT) 4 MG disintegrating tablet Take 1 tablet (4 mg total) by mouth every 8 (eight) hours as needed for nausea or vomiting. 09/05/19   Farrie Sann, PA-C  ondansetron (ZOFRAN) 4 MG tablet Take 1 tablet (4 mg total) by mouth every 6 (six) hours as needed for nausea or vomiting. Patient not taking: Reported on 09/02/2019 04/03/19   Orpah Greek, MD  Oxycodone HCl 10 MG TABS Take 10 mg by mouth 4 (four) times daily as needed (pain).    [provider]    Family History No family history on file.  Social History Social History   Tobacco Use  . Smoking status: Current Every Day Smoker    Packs/day: 0.50  . Smokeless tobacco: Current User  Substance Use Topics  . Alcohol use: Not Currently    Alcohol/week: 42.0 standard drinks    Types: 42 Cans of beer per week    Comment: pt  states he has not had a drinik since april  . Drug use: Not on file     Allergies   Other   Review of Systems Review of Systems  Gastrointestinal: Positive for abdominal pain, constipation, nausea and vomiting.  Neurological: Positive for weakness.  All other systems reviewed and are negative.    Physical Exam Updated Vital Signs BP 102/73   Pulse (!) 51   Temp 97.8 F (36.6 C) (Oral)   Resp (!) 24   SpO2 99%   Physical Exam Vitals signs and nursing note reviewed.  Constitutional:      General: He is not in acute distress.    Appearance: He is well-developed.     Comments: Appears nontoxic  HENT:     Head: Normocephalic and atraumatic.  Eyes:     Extraocular Movements:  Extraocular movements intact.     Conjunctiva/sclera: Conjunctivae normal.     Pupils: Pupils are equal, round, and reactive to light.  Neck:     Musculoskeletal: Normal range of motion and neck supple.  Cardiovascular:     Rate and Rhythm: Regular rhythm. Bradycardia present.     Pulses: Normal pulses.     Comments: Mildly bradycardic Pulmonary:     Effort: Pulmonary effort is normal. No respiratory distress.     Breath sounds: Normal breath sounds. No wheezing.  Abdominal:     General: There is no distension.     Palpations: Abdomen is soft. There is no mass.     Tenderness: There is abdominal tenderness. There is no guarding or rebound.     Comments: TTP of epigastric abd. No ttp elsewhere. No rigidity or distention.   Musculoskeletal: Normal range of motion.  Skin:    General: Skin is warm and dry.     Capillary Refill: Capillary refill takes less than 2 seconds.  Neurological:     Mental Status: He is alert and oriented to person, place, and time.      ED Treatments / Results  Labs (all labs ordered are listed, but only abnormal results are displayed) Labs Reviewed  COMPREHENSIVE METABOLIC PANEL - Abnormal; Notable for the following components:      Result Value   Sodium 129 (*)    Chloride 95 (*)    Glucose, Bld 112 (*)    BUN <5 (*)    Calcium 8.6 (*)    AST 13 (*)    All other components within normal limits  CBC - Abnormal; Notable for the following components:   Hemoglobin 12.4 (*)    HCT 36.5 (*)    All other components within normal limits  URINALYSIS, ROUTINE W REFLEX MICROSCOPIC - Abnormal; Notable for the following components:   Color, Urine COLORLESS (*)    Specific Gravity, Urine 1.000 (*)    All other components within normal limits  LIPASE, BLOOD  TROPONIN I (HIGH SENSITIVITY)    EKG EKG Interpretation  Date/Time:  Thursday September 05 2019 05:17:08 EST Ventricular Rate:  53 PR Interval:  120 QRS Duration: 94 QT Interval:  454 QTC  Calculation: 426 R Axis:   70 Text Interpretation: Sinus bradycardia Incomplete right bundle branch block Cannot rule out Anterior infarct , age undetermined Abnormal ECG No significant change since last tracing Confirmed by Margarita Grizzleay, Danielle [phone]([id number]) on 09/05/2019 9:06:37 AM   Radiology Dg Chest 2 View  Result Date: 09/05/2019 CLINICAL DATA:  Chest pain.  Shortness of breath. EXAM: CHEST - 2 VIEW COMPARISON:  Two-view chest x-ray 07/02/2019. FINDINGS:  The heart size and mediastinal contours are within normal limits. Both lungs are clear. The visualized skeletal structures are unremarkable. IMPRESSION: Negative two view chest x-ray Electronically Signed   By: Marin Roberts M.D.   On: 09/05/2019 06:22    Procedures Procedures (including critical care time)  Medications Ordered in ED Medications  sodium chloride flush (NS) 0.9 % injection 3 mL (3 mLs Intravenous Not Given 09/05/19 0619)  oxyCODONE-acetaminophen (PERCOCET/ROXICET) 5-325 MG per tablet 1 tablet (has no administration in time range)  ondansetron (ZOFRAN) injection 4 mg (4 mg Intravenous Given 09/05/19 0741)  sodium chloride 0.9 % bolus 1,000 mL (0 mLs Intravenous Stopped 09/05/19 0928)  pantoprazole (PROTONIX) injection 40 mg (40 mg Intravenous Given 09/05/19 0719)  alum & mag hydroxide-simeth (MAALOX/MYLANTA) 200-200-20 MG/5ML suspension 30 mL (30 mLs Oral Given 09/05/19 0825)    And  lidocaine (XYLOCAINE) 2 % viscous mouth solution 15 mL (15 mLs Oral Given 09/05/19 0824)  dicyclomine (BENTYL) capsule 10 mg (10 mg Oral Given 09/05/19 5093)     Initial Impression / Assessment and Plan / ED Course  I have reviewed the triage vital signs and the nursing notes.  Pertinent labs & imaging results that were available during my care of the patient were reviewed by me and considered in my medical decision making (see chart for details).        Pt presenting for evaluation of nausea, vomiting, epigastric pain.  Physical exam  reassuring, he appears nontoxic.  He is mildly bradycardic, this was similar to his most recent admission.  On exam, he has epigastric tenderness, but no significant tenderness elsewhere in the abdomen.  Frequent admissions for pancreatitis, will obtain abdominal labs including lipase for further evaluation.  Patient had a CT scan 4 days ago, will hold on repeat scan at this time.  Will treat symptomatically with fluids, zofran, and protonix.   On reassessment, pt's nausea improved.  Continues to have some discomfort.  Will treat with GI cocktail and Bentyl.  Labs reassuring, no leukocytosis.  Lipase normal. slightly low sodium and chloride, likely dehydration related and due to vomiting. No acidosis. Repeat abdominal exam reassuring without peritonitis or surgical abd.   On reassessment, pain is improved. discussed clear liquid diet and symptomatic treatment. I do not believe he needs repeat ct scan in the setting of normal labs and improving symptoms. At this time, pt appears safe for d/c. Return precautions given. Pt states he understands and agrees to plan.   Final Clinical Impressions(s) / ED Diagnoses   Final diagnoses:  Epigastric abdominal pain  Non-intractable vomiting with nausea, unspecified vomiting type    ED Discharge Orders         Ordered    dicyclomine (BENTYL) 20 MG tablet  2 times daily PRN     09/05/19 0903    ondansetron (ZOFRAN ODT) 4 MG disintegrating tablet  Every 8 hours PRN     09/05/19 0903           Alveria Apley, PA-C 09/05/19 2671    Margarita Grizzle, MD 09/05/19 1658

## 2019-09-05 NOTE — ED Triage Notes (Signed)
Patient reports central chest pain and upper abdominal pain with mild SOB and persistent emesis onset last night , no fever or diarrhea .

## 2019-09-21 ENCOUNTER — Encounter (HOSPITAL_COMMUNITY): Payer: Self-pay | Admitting: *Deleted

## 2019-09-21 ENCOUNTER — Emergency Department (HOSPITAL_COMMUNITY): Payer: Medicaid Other

## 2019-09-21 ENCOUNTER — Other Ambulatory Visit: Payer: Self-pay

## 2019-09-21 ENCOUNTER — Emergency Department (HOSPITAL_COMMUNITY)
Admission: EM | Admit: 2019-09-21 | Discharge: 2019-09-22 | Disposition: A | Discharge status: Home / Self Care | Payer: Medicaid Other | Attending: Emergency Medicine | Admitting: Emergency Medicine

## 2019-09-21 DIAGNOSIS — R1010 Upper abdominal pain, unspecified: Secondary | ICD-10-CM | POA: Diagnosis present

## 2019-09-21 DIAGNOSIS — K5903 Drug induced constipation: Secondary | ICD-10-CM

## 2019-09-21 DIAGNOSIS — F17200 Nicotine dependence, unspecified, uncomplicated: Secondary | ICD-10-CM | POA: Insufficient documentation

## 2019-09-21 DIAGNOSIS — Z79899 Other long term (current) drug therapy: Secondary | ICD-10-CM | POA: Diagnosis not present

## 2019-09-21 DIAGNOSIS — F17220 Nicotine dependence, chewing tobacco, uncomplicated: Secondary | ICD-10-CM | POA: Insufficient documentation

## 2019-09-21 DIAGNOSIS — T402X5A Adverse effect of other opioids, initial encounter: Secondary | ICD-10-CM | POA: Insufficient documentation

## 2019-09-21 LAB — CBC
HCT: 44.8 % (ref 39.0–52.0)
Hemoglobin: 14.3 g/dL (ref 13.0–17.0)
MCH: 29.2 pg (ref 26.0–34.0)
MCHC: 31.9 g/dL (ref 30.0–36.0)
MCV: 91.4 fL (ref 80.0–100.0)
Platelets: 196 10*3/uL (ref 150–400)
RBC: 4.9 MIL/uL (ref 4.22–5.81)
RDW: 15.5 % (ref 11.5–15.5)
WBC: 6.5 10*3/uL (ref 4.0–10.5)
nRBC: 0 % (ref 0.0–0.2)

## 2019-09-21 MED ORDER — SODIUM CHLORIDE 0.9% FLUSH
3.0000 mL | Freq: Once | INTRAVENOUS | Status: AC
  Administered 2019-09-22: 3 mL via INTRAVENOUS

## 2019-09-21 NOTE — ED Triage Notes (Signed)
The pt is c/o chest and abdomen pain for 2 days with n and v

## 2019-09-22 ENCOUNTER — Emergency Department (HOSPITAL_COMMUNITY): Payer: Medicaid Other

## 2019-09-22 LAB — HEPATIC FUNCTION PANEL
ALT: 47 U/L — ABNORMAL HIGH (ref 0–44)
AST: 29 U/L (ref 15–41)
Albumin: 4.1 g/dL (ref 3.5–5.0)
Alkaline Phosphatase: 112 U/L (ref 38–126)
Bilirubin, Direct: 0.1 mg/dL (ref 0.0–0.2)
Indirect Bilirubin: 0.6 mg/dL (ref 0.3–0.9)
Total Bilirubin: 0.7 mg/dL (ref 0.3–1.2)
Total Protein: 7.2 g/dL (ref 6.5–8.1)

## 2019-09-22 LAB — BASIC METABOLIC PANEL
Anion gap: 10 (ref 5–15)
BUN: 13 mg/dL (ref 6–20)
CO2: 28 mmol/L (ref 22–32)
Calcium: 9.5 mg/dL (ref 8.9–10.3)
Chloride: 99 mmol/L (ref 98–111)
Creatinine, Ser: 0.69 mg/dL (ref 0.61–1.24)
GFR calc Af Amer: >60 mL/min (ref >60)
GFR calc non Af Amer: >60 mL/min (ref >60)
Glucose, Bld: 144 mg/dL — ABNORMAL HIGH (ref 70–99)
Potassium: 4.6 mmol/L (ref 3.5–5.1)
Sodium: 137 mmol/L (ref 135–145)

## 2019-09-22 LAB — TROPONIN I (HIGH SENSITIVITY)
Troponin I (High Sensitivity): <2 ng/L (ref <18)
Troponin I (High Sensitivity): <2 ng/L (ref <18)

## 2019-09-22 LAB — LIPASE, BLOOD: Lipase: 17 U/L (ref 11–51)

## 2019-09-22 MED ORDER — ALUM & MAG HYDROXIDE-SIMETH 200-200-20 MG/5ML PO SUSP
30.0000 mL | Freq: Once | ORAL | Status: AC
  Administered 2019-09-22: 30 mL via ORAL
  Filled 2019-09-22: qty 30

## 2019-09-22 MED ORDER — SODIUM CHLORIDE 0.9 % IV BOLUS
1000.0000 mL | Freq: Once | INTRAVENOUS | Status: AC
  Administered 2019-09-22: 1000 mL via INTRAVENOUS

## 2019-09-22 MED ORDER — POLYETHYLENE GLYCOL 3350 17 GM/SCOOP PO POWD: ORAL | 0 refills | Status: AC

## 2019-09-22 MED ORDER — METOCLOPRAMIDE HCL 5 MG/ML IJ SOLN
10.0000 mg | Freq: Once | INTRAMUSCULAR | Status: AC
  Administered 2019-09-22: 10 mg via INTRAVENOUS
  Filled 2019-09-22: qty 2

## 2019-09-22 MED ORDER — LIDOCAINE VISCOUS HCL 2 % MT SOLN: 15.0000 mL | Freq: Four times a day (QID) | OROMUCOSAL | 0 refills | Status: DC | PRN

## 2019-09-22 MED ORDER — LIDOCAINE VISCOUS HCL 2 % MT SOLN
15.0000 mL | Freq: Once | OROMUCOSAL | Status: AC
  Administered 2019-09-22: 15 mL via ORAL
  Filled 2019-09-22: qty 15

## 2019-09-22 MED ORDER — HYOSCYAMINE SULFATE 0.125 MG SL SUBL
0.2500 mg | SUBLINGUAL_TABLET | Freq: Once | SUBLINGUAL | Status: AC
  Administered 2019-09-22: 0.25 mg via SUBLINGUAL
  Filled 2019-09-22: qty 2

## 2019-09-22 NOTE — ED Notes (Signed)
Discharge instructions reviewed with pt. Pt verbalized understanding.   

## 2019-09-22 NOTE — ED Notes (Signed)
Patient transported to X-ray 

## 2019-09-22 NOTE — ED Provider Notes (Signed)
[facility] EMERGENCY DEPARTMENT Provider Note  CSN: [id number] Arrival date & time: 09/21/19 2304  Chief Complaint(s) Abdominal Pain  HPI Austin Jenkins is a 33 y.o. male with a past medical history listed below including alcohol related chronic pancreatitis who presents to the emergency department with epigastric and left upper quadrant abdominal pain not controlled with his home narcotic medication.  Patient denies any alcohol use in several months.  Denies any other illicit drug use.  Endorses one episode of nausea vomiting.  Pain radiates across the top and into his chest.  Exacerbated with movement and palpation.  Also worse with eating.  No alleviating factors.  Denies any diarrhea.  No other physical complaints.  The history is provided by the patient.    Past Medical History Past Medical History:  Diagnosis Date  . Alcohol abuse   . Anxiety   . Depression   . IBS (irritable bowel syndrome)   . Pancreatitis    Patient Active Problem List   Diagnosis Date Noted  . Epigastric pain   . Acute on chronic pancreatitis ([facility]) 08/09/2019  . Acute alcoholic pancreatitis 10/24/2018   Home Medication(s) Prior to Admission medications   Medication Sig Start Date End Date Taking? Authorizing Provider  ALPRAZolam Prudy Feeler) 1 MG tablet Take 1 mg by mouth 2 (two) times daily as needed for anxiety.    [provider]  buPROPion (WELLBUTRIN SR) 100 MG 12 hr tablet Take 100 mg by mouth daily. 03/28/19   [provider]  dicyclomine (BENTYL) 20 MG tablet Take 1 tablet (20 mg total) by mouth 2 (two) times daily as needed for spasms. 09/05/19   Caccavale, Sophia, PA-C  lidocaine (XYLOCAINE) 2 % solution Use as directed 15 mLs in the mouth or throat every 6 (six) hours as needed for mouth pain. 09/22/19   Nira Conn, MD  omeprazole (PRILOSEC OTC) 20 MG tablet Take 20 mg by mouth daily.    [provider]  ondansetron (ZOFRAN ODT) 4 MG  disintegrating tablet Take 1 tablet (4 mg total) by mouth every 8 (eight) hours as needed for nausea or vomiting. 09/05/19   Caccavale, Sophia, PA-C  ondansetron (ZOFRAN) 4 MG tablet Take 1 tablet (4 mg total) by mouth every 6 (six) hours as needed for nausea or vomiting. Patient not taking: Reported on 09/02/2019 04/03/19   Gilda Crease, MD  Oxycodone HCl 10 MG TABS Take 10 mg by mouth 4 (four) times daily as needed (pain).    [provider]  polyethylene glycol powder (MIRALAX) 17 GM/SCOOP powder Start taking 1 capful 3 times a day. Slowly cut back as needed until you have normal bowel movements. Stay on 1 capful per day while on narcotic pain medication 09/22/19   Courtnei Ruddell, Amadeo Garnet, MD  Past Surgical History Past Surgical History:  Procedure Laterality Date  . CHOLECYSTECTOMY     Family History No family history on file.  Social History Social History   Tobacco Use  . Smoking status: Current Every Day Smoker    Packs/day: 0.50  . Smokeless tobacco: Current User  Substance Use Topics  . Alcohol use: Not Currently    Alcohol/week: 42.0 standard drinks    Types: 42 Cans of beer per week    Comment: pt states he has not had a drinik since april  . Drug use: Not on file   Allergies Other  Review of Systems Review of Systems All other systems are reviewed and are negative for acute change except as noted in the HPI  Physical Exam Vital Signs  I have reviewed the triage vital signs BP 98/69   Pulse 76   Temp 98.7 F (37.1 C) (Oral)   Resp (!) 21   Ht  (1.778 m)   Wt 62.6 kg   SpO2 98%   BMI 19.80 kg/m   Physical Exam Vitals reviewed.  Constitutional:      General: He is not in acute distress.    Appearance: He is well-developed. He is not diaphoretic.  HENT:     Head: Normocephalic and atraumatic.     Jaw: No  trismus.     Right Ear: External ear normal.     Left Ear: External ear normal.     Nose: Nose normal.  Eyes:     General: No scleral icterus.    Conjunctiva/sclera: Conjunctivae normal.  Neck:     Trachea: Phonation normal.  Cardiovascular:     Rate and Rhythm: Normal rate and regular rhythm.  Pulmonary:     Effort: Pulmonary effort is normal. No respiratory distress.     Breath sounds: No stridor.  Abdominal:     General: There is no distension.     Tenderness: There is abdominal tenderness in the epigastric area and left upper quadrant. There is no guarding or rebound. Negative signs include Murphy's sign.  Musculoskeletal:        General: Normal range of motion.     Cervical back: Normal range of motion.  Neurological:     Mental Status: He is alert and oriented to person, place, and time.  Psychiatric:        Behavior: Behavior normal.     ED Results and Treatments Labs (all labs ordered are listed, but only abnormal results are displayed) Labs Reviewed  BASIC METABOLIC PANEL - Abnormal; Notable for the following components:      Result Value   Glucose, Bld 144 (*)    All other components within normal limits  HEPATIC FUNCTION PANEL - Abnormal; Notable for the following components:   ALT 47 (*)    All other components within normal limits  CBC  LIPASE, BLOOD  TROPONIN I (HIGH SENSITIVITY)  TROPONIN I (HIGH SENSITIVITY)  EKG  EKG Interpretation  Date/Time:  Saturday September 21 2019 23:17:01 EST Ventricular Rate:  105 PR Interval:  124 QRS Duration: 82 QT Interval:  342 QTC Calculation: 452 R Axis:   83 Text Interpretation: Sinus tachycardia Cannot rule out Anterior infarct , age undetermined Abnormal ECG Otherwise no significant change Confirmed by Addison Lank [phone]) on 09/22/2019 12:41:38 AM      Radiology DG Chest 2 View  Result  Date: 09/22/2019 CLINICAL DATA:  Chest and abdominal pain x2 days. EXAM: CHEST - 2 VIEW COMPARISON:  September 05, 2019 FINDINGS: The heart size and mediastinal contours are within normal limits. Both lungs are clear. The visualized skeletal structures are unremarkable. IMPRESSION: No active cardiopulmonary disease. Electronically Signed   By: Constance Holster M.D.   On: 09/22/2019 00:12   DG Abdomen 1 View  Result Date: 09/22/2019 CLINICAL DATA:  Midline abdominal pain. EXAM: ABDOMEN - 1 VIEW COMPARISON:  CT dated September 02, 2019. FINDINGS: There is a large amount of stool in the ascending colon.The bowel gas pattern is nonobstructive. The patient is status post cholecystectomy. IMPRESSION: Large amount of stool in the ascending colon. Nonobstructive bowel gas pattern. Electronically Signed   By: Constance Holster M.D.   On: 09/22/2019 02:10    Pertinent labs & imaging results that were available during my care of the patient were reviewed by me and considered in my medical decision making (see chart for details).  Medications Ordered in ED Medications  sodium chloride flush (NS) 0.9 % injection 3 mL (3 mLs Intravenous Given 09/22/19 0053)  sodium chloride 0.9 % bolus 1,000 mL (0 mLs Intravenous Stopped 09/22/19 0354)  alum & mag hydroxide-simeth (MAALOX/MYLANTA) 200-200-20 MG/5ML suspension 30 mL (30 mLs Oral Given 09/22/19 0223)    And  lidocaine (XYLOCAINE) 2 % viscous mouth solution 15 mL (15 mLs Oral Given 09/22/19 0223)  hyoscyamine (LEVSIN SL) SL tablet 0.25 mg (0.25 mg Sublingual Given 09/22/19 0222)  metoCLOPramide (REGLAN) injection 10 mg (10 mg Intravenous Given 09/22/19 0221)                                                                                                                                    Procedures Procedures  (including critical care time)  Medical Decision Making / ED Course I have reviewed the nursing notes for this encounter and the patient's prior  records (if available in EHR or on provided paperwork).   Austin Jenkins was evaluated in Emergency Department on 09/22/2019 for the symptoms described in the history of present illness. He was evaluated in the context of the global COVID-19 pandemic, which necessitated consideration that the patient might be at risk for infection with the SARS-CoV-2 virus that causes COVID-19. Institutional protocols and algorithms that pertain to the evaluation of patients at risk for COVID-19 are in a state of rapid change based on information released by regulatory bodies including the CDC and federal and  state organizations. These policies and algorithms were followed during the patient's care in the ED.  Upper abdominal pain.  Patient has tenderness to palpation without evidence of peritonitis.  Labs grossly reassuring without leukocytosis or significant anemia.  No significant electrolyte derangements or renal sufficiency.  No evidence of bili obstruction or pancreatitis.  Pain improved following GI cocktail and Reglan.  KUB notable for large stool burden in the ascending colon.  Recommended daily stool softeners.  Cardiac work-up was initiated in triage.  EKG without acute ischemic changes or evidence of pericarditis.  Chest x-ray unremarkable.  Serial troponins negative.  Doubt cardiac etiology.  Not classic for dissection, or esophageal perforation.  Doubt PE.   The patient appears reasonably screened and/or stabilized for discharge and I doubt any other medical condition or other [facility] requiring further screening, evaluation, or treatment in the ED at this time prior to discharge.  The patient is safe for discharge with strict return precautions.      Final Clinical Impression(s) / ED Diagnoses Final diagnoses:  Pain of upper abdomen  Drug-induced constipation    The patient appears reasonably screened and/or stabilized for discharge and I doubt any other medical condition or other [facility] requiring  further screening, evaluation, or treatment in the ED at this time prior to discharge.  Disposition: Discharge  Condition: Good  I have discussed the results, Dx and Tx plan with the patient who expressed understanding and agree(s) with the plan. Discharge instructions discussed at great length. The patient was given strict return precautions who verbalized understanding of the instructions. No further questions at time of discharge.    ED Discharge Orders         Ordered    polyethylene glycol powder (MIRALAX) 17 GM/SCOOP powder     09/22/19 0350    lidocaine (XYLOCAINE) 2 % solution  Every 6 hours PRN     09/22/19 0350          [facility] Up: [facility] [address] [place] [address] [phone]  Schedule an appointment as soon as possible for a visit  As needed      This chart was dictated using voice recognition software.  Despite best efforts to proofread,  errors can occur which can change the documentation meaning.   Nira Connardama, Cedric Denison Eduardo, MD 09/22/19 [phone]

## 2019-11-10 ENCOUNTER — Emergency Department (HOSPITAL_COMMUNITY)
Admission: EM | Admit: 2019-11-10 | Discharge: 2019-11-10 | Disposition: A | Discharge status: Home / Self Care | Payer: Medicaid Other | Attending: Emergency Medicine | Admitting: Emergency Medicine

## 2019-11-10 ENCOUNTER — Other Ambulatory Visit: Payer: Self-pay

## 2019-11-10 ENCOUNTER — Encounter (HOSPITAL_COMMUNITY): Payer: Self-pay | Admitting: Emergency Medicine

## 2019-11-10 DIAGNOSIS — R1013 Epigastric pain: Secondary | ICD-10-CM | POA: Diagnosis present

## 2019-11-10 DIAGNOSIS — F17210 Nicotine dependence, cigarettes, uncomplicated: Secondary | ICD-10-CM | POA: Diagnosis not present

## 2019-11-10 DIAGNOSIS — Z79899 Other long term (current) drug therapy: Secondary | ICD-10-CM | POA: Insufficient documentation

## 2019-11-10 LAB — CBC
HCT: 44.8 % (ref 39.0–52.0)
Hemoglobin: 15 g/dL (ref 13.0–17.0)
MCH: 29.2 pg (ref 26.0–34.0)
MCHC: 33.5 g/dL (ref 30.0–36.0)
MCV: 87.2 fL (ref 80.0–100.0)
Platelets: 192 10*3/uL (ref 150–400)
RBC: 5.14 MIL/uL (ref 4.22–5.81)
RDW: 13.3 % (ref 11.5–15.5)
WBC: 7.3 10*3/uL (ref 4.0–10.5)
nRBC: 0 % (ref 0.0–0.2)

## 2019-11-10 LAB — URINALYSIS, ROUTINE W REFLEX MICROSCOPIC
Bilirubin Urine: NEGATIVE
Glucose, UA: NEGATIVE mg/dL
Hgb urine dipstick: NEGATIVE
Ketones, ur: NEGATIVE mg/dL
Leukocytes,Ua: NEGATIVE
Nitrite: NEGATIVE
Protein, ur: NEGATIVE mg/dL
Specific Gravity, Urine: 1.005 (ref 1.005–1.030)
pH: 6 (ref 5.0–8.0)

## 2019-11-10 LAB — COMPREHENSIVE METABOLIC PANEL
ALT: 13 U/L (ref 0–44)
AST: 18 U/L (ref 15–41)
Albumin: 4.1 g/dL (ref 3.5–5.0)
Alkaline Phosphatase: 75 U/L (ref 38–126)
Anion gap: 12 (ref 5–15)
BUN: <5 mg/dL — ABNORMAL LOW (ref 6–20)
CO2: 24 mmol/L (ref 22–32)
Calcium: 9.6 mg/dL (ref 8.9–10.3)
Chloride: 105 mmol/L (ref 98–111)
Creatinine, Ser: 0.86 mg/dL (ref 0.61–1.24)
GFR calc Af Amer: >60 mL/min (ref >60)
GFR calc non Af Amer: >60 mL/min (ref >60)
Glucose, Bld: 151 mg/dL — ABNORMAL HIGH (ref 70–99)
Potassium: 3.5 mmol/L (ref 3.5–5.1)
Sodium: 141 mmol/L (ref 135–145)
Total Bilirubin: 0.7 mg/dL (ref 0.3–1.2)
Total Protein: 6.8 g/dL (ref 6.5–8.1)

## 2019-11-10 LAB — LIPASE, BLOOD: Lipase: 15 U/L (ref 11–51)

## 2019-11-10 MED ORDER — SODIUM CHLORIDE 0.9% FLUSH
3.0000 mL | Freq: Once | INTRAVENOUS | Status: AC
  Administered 2019-11-10: 3 mL via INTRAVENOUS

## 2019-11-10 MED ORDER — LIDOCAINE VISCOUS HCL 2 % MT SOLN
15.0000 mL | Freq: Once | OROMUCOSAL | Status: AC
  Administered 2019-11-10: 15 mL via ORAL
  Filled 2019-11-10: qty 15

## 2019-11-10 MED ORDER — SODIUM CHLORIDE 0.9 % IV BOLUS
1000.0000 mL | Freq: Once | INTRAVENOUS | Status: AC
  Administered 2019-11-10: 1000 mL via INTRAVENOUS

## 2019-11-10 MED ORDER — PANTOPRAZOLE SODIUM 40 MG IV SOLR
40.0000 mg | Freq: Once | INTRAVENOUS | Status: AC
  Administered 2019-11-10: 40 mg via INTRAVENOUS
  Filled 2019-11-10: qty 40

## 2019-11-10 MED ORDER — SUCRALFATE 1 GM/10ML PO SUSP: 1.0000 g | Freq: Three times a day (TID) | ORAL | 0 refills | Status: DC

## 2019-11-10 MED ORDER — ALUM & MAG HYDROXIDE-SIMETH 200-200-20 MG/5ML PO SUSP
30.0000 mL | Freq: Once | ORAL | Status: AC
  Administered 2019-11-10: 30 mL via ORAL
  Filled 2019-11-10: qty 30

## 2019-11-10 MED ORDER — KETOROLAC TROMETHAMINE 15 MG/ML IJ SOLN
15.0000 mg | Freq: Once | INTRAMUSCULAR | Status: AC
  Administered 2019-11-10: 15 mg via INTRAVENOUS
  Filled 2019-11-10: qty 1

## 2019-11-10 MED ORDER — HALOPERIDOL LACTATE 5 MG/ML IJ SOLN
5.0000 mg | Freq: Once | INTRAMUSCULAR | Status: AC
  Administered 2019-11-10: 5 mg via INTRAVENOUS
  Filled 2019-11-10: qty 1

## 2019-11-10 MED ORDER — ONDANSETRON HCL 4 MG/2ML IJ SOLN
4.0000 mg | Freq: Once | INTRAMUSCULAR | Status: AC
  Administered 2019-11-10: 4 mg via INTRAVENOUS
  Filled 2019-11-10: qty 2

## 2019-11-10 NOTE — Discharge Instructions (Signed)
Continue taking your home medications as prescribed. Continue using Zofran as needed for nausea or vomiting. Use Carafate to help with pain. Follow-up with your stomach doctor at your scheduled appointment on Tuesday. Return to emergency room if you develop high fevers, severe worsening pain, persistent vomiting, any new, worsening, or concerning symptoms.

## 2019-11-10 NOTE — ED Triage Notes (Signed)
Pt reports hx of pancreatitis, states that he started having L upper abd pain last night with an episode of vomiting, last episode of pancreatitis was last month. Denies fevers or chills, denies recent sick contacts.

## 2019-11-10 NOTE — ED Provider Notes (Signed)
[facility] EMERGENCY DEPARTMENT Provider Note   CSN: [id number] Arrival date & time: 11/10/19  1056     History Chief Complaint  Patient presents with  . Abdominal Pain    Quadarius Henton is a 34 y.o. male presenting for evaluation of upper abd pain.   Patient states he has a history of pancreatitis and upper abdominal pain.  He developed pain last night, which worsened after he tried to eat.  He had one episode of emesis yesterday, it was not bloody.  He has had persistent nausea since, but has not taken anything for this.  Patient reports continued severe upper abdominal pain.  It is constant, nothing makes better or worse.  He denies fevers, chills, shortness breath, chest pain, cough, urinary symptoms, abnormal bowel movements.  He states he has taken his home oxycodone last night, but has not taken any medicine for pain today.  He states he is on something for GERD, states he is taking Protonix, but did not take it this morning.  Additional history obtained from chart review.  Patient with a history of otitis.  Had recent CT scan, MRI, and MRCP with his GI doctor which showed stable inflammation of the pancreas, consider possible pseudocyst.  HPI     Past Medical History:  Diagnosis Date  . Alcohol abuse   . Anxiety   . Depression   . IBS (irritable bowel syndrome)   . Pancreatitis     Patient Active Problem List   Diagnosis Date Noted  . Epigastric pain   . Acute on chronic pancreatitis (HCC) 08/09/2019  . Acute alcoholic pancreatitis 10/24/2018    Past Surgical History:  Procedure Laterality Date  . CHOLECYSTECTOMY         No family history on file.  Social History   Tobacco Use  . Smoking status: Current Every Day Smoker    Packs/day: 0.50  . Smokeless tobacco: Current User  Substance Use Topics  . Alcohol use: Not Currently    Alcohol/week: 42.0 standard drinks    Types: 42 Cans of beer per week    Comment: pt states he has not had  a drinik since april  . Drug use: Not on file    Home Medications Prior to Admission medications   Medication Sig Start Date End Date Taking? Authorizing Provider  ALPRAZolam Prudy Feeler) 1 MG tablet Take 1 mg by mouth 2 (two) times daily as needed for anxiety.    [provider]  buPROPion (WELLBUTRIN SR) 100 MG 12 hr tablet Take 100 mg by mouth daily. 03/28/19   [provider]  dicyclomine (BENTYL) 20 MG tablet Take 1 tablet (20 mg total) by mouth 2 (two) times daily as needed for spasms. 09/05/19   Beulah Capobianco, PA-C  lidocaine (XYLOCAINE) 2 % solution Use as directed 15 mLs in the mouth or throat every 6 (six) hours as needed for mouth pain. 09/22/19   Nira Conn, MD  omeprazole (PRILOSEC OTC) 20 MG tablet Take 20 mg by mouth daily.    [provider]  ondansetron (ZOFRAN ODT) 4 MG disintegrating tablet Take 1 tablet (4 mg total) by mouth every 8 (eight) hours as needed for nausea or vomiting. 09/05/19   Kenith Trickel, PA-C  ondansetron (ZOFRAN) 4 MG tablet Take 1 tablet (4 mg total) by mouth every 6 (six) hours as needed for nausea or vomiting. Patient not taking: Reported on 09/02/2019 04/03/19   Gilda Crease, MD  Oxycodone HCl 10 MG  TABS Take 10 mg by mouth 4 (four) times daily as needed (pain).    [provider]  polyethylene glycol powder (MIRALAX) 17 GM/SCOOP powder Start taking 1 capful 3 times a day. Slowly cut back as needed until you have normal bowel movements. Stay on 1 capful per day while on narcotic pain medication 09/22/19   Cardama, Grayce Sessions, MD  sucralfate (CARAFATE) 1 GM/10ML suspension Take 10 mLs (1 g total) by mouth 4 (four) times daily -  with meals and at bedtime. 11/10/19   Shaquella Stamant, PA-C    Allergies    Other  Review of Systems   Review of Systems  Gastrointestinal: Positive for abdominal pain, nausea and vomiting.  All other systems reviewed and are negative.   Physical Exam Updated  Vital Signs BP 117/80 (BP Location: Right Arm)   Pulse 62   Temp 98 F (36.7 C)   Resp 16   SpO2 98%   Physical Exam Vitals and nursing note reviewed.  Constitutional:      General: He is not in acute distress.    Appearance: He is well-developed.     Comments: Appears nontoxic  HENT:     Head: Normocephalic and atraumatic.  Eyes:     Conjunctiva/sclera: Conjunctivae normal.     Pupils: Pupils are equal, round, and reactive to light.  Cardiovascular:     Rate and Rhythm: Normal rate and regular rhythm.     Pulses: Normal pulses.  Pulmonary:     Effort: Pulmonary effort is normal. No respiratory distress.     Breath sounds: Normal breath sounds. No wheezing.  Abdominal:     General: There is no distension.     Palpations: Abdomen is soft. There is no mass.     Tenderness: There is abdominal tenderness. There is no guarding or rebound.     Comments: Tenderness palpation of epigastric abdomen.  No rigidity, guarding, distention.  Negative rebound.  Negative Murphy's.  Musculoskeletal:        General: Normal range of motion.     Cervical back: Normal range of motion and neck supple.  Skin:    General: Skin is warm and dry.     Capillary Refill: Capillary refill takes less than 2 seconds.  Neurological:     Mental Status: He is alert and oriented to person, place, and time.     ED Results / Procedures / Treatments   Labs (all labs ordered are listed, but only abnormal results are displayed) Labs Reviewed  COMPREHENSIVE METABOLIC PANEL - Abnormal; Notable for the following components:      Result Value   Glucose, Bld 151 (*)    BUN <5 (*)    All other components within normal limits  LIPASE, BLOOD  CBC  URINALYSIS, ROUTINE W REFLEX MICROSCOPIC    EKG None  Radiology No results found.  Procedures Procedures (including critical care time)  Medications Ordered in ED Medications  sodium chloride flush (NS) 0.9 % injection 3 mL (3 mLs Intravenous Given 11/10/19  1451)  sodium chloride 0.9 % bolus 1,000 mL (0 mLs Intravenous Stopped 11/10/19 1310)  pantoprazole (PROTONIX) injection 40 mg (40 mg Intravenous Given 11/10/19 1210)  ondansetron (ZOFRAN) injection 4 mg (4 mg Intravenous Given 11/10/19 1210)  alum & mag hydroxide-simeth (MAALOX/MYLANTA) 200-200-20 MG/5ML suspension 30 mL (30 mLs Oral Given 11/10/19 1306)    And  lidocaine (XYLOCAINE) 2 % viscous mouth solution 15 mL (15 mLs Oral Given 11/10/19 1306)  ketorolac (TORADOL) 15  MG/ML injection 15 mg (15 mg Intravenous Given 11/10/19 1305)  haloperidol lactate (HALDOL) injection 5 mg (5 mg Intravenous Given 11/10/19 1450)    ED Course  I have reviewed the triage vital signs and the nursing notes.  Pertinent labs & imaging results that were available during my care of the patient were reviewed by me and considered in my medical decision making (see chart for details).    MDM Rules/Calculators/A&P                      Patient presenting for evaluation of epigastric abdominal pain, nausea, vomiting.  Physical exam reassuring, patient is nontoxic.  He does have a history of pancreatitis and possible pseudocyst.  Will obtain labs, treat symptomatically, and reassess.  Labs reassuring.  No leukocytosis.  Lipase normal.  CMP reassuring, including LFTs and bili.  Doubt acute infection.  Doubt acute pancreatitis.  Likely gastritis.  As patient has had recent imaging with his GI doctor, will not repeat today.   On reassessment after pain medication, patient reports mild improvement after Haldol.  He is tolerating p.o. without difficulty.  He states he has a follow-up appointment with his GI doctor in 2 days.  As he is tolerating fluids, sxs are improving, labs are reassuring, and he appears nontoxic, I believe he is safe for d/c. Return precautions given. Pt states he understands and agrees to plan.   Final Clinical Impression(s) / ED Diagnoses Final diagnoses:  Epigastric abdominal pain    Rx / DC Orders ED  Discharge Orders         Ordered    sucralfate (CARAFATE) 1 GM/10ML suspension  3 times daily with meals & bedtime     11/10/19 1509           Dakisha Schoof, PA-C 11/10/19 1527    Tilden Fossa, MD 11/11/19 1222

## 2019-12-07 ENCOUNTER — Inpatient Hospital Stay (HOSPITAL_COMMUNITY)
Admission: EM | Admit: 2019-12-07 | Discharge: 2019-12-10 | DRG: 440 | Disposition: A | Discharge status: Home / Self Care | Payer: Medicaid Other | Attending: Family Medicine | Admitting: Family Medicine

## 2019-12-07 ENCOUNTER — Encounter (HOSPITAL_COMMUNITY): Payer: Self-pay

## 2019-12-07 ENCOUNTER — Other Ambulatory Visit: Payer: Self-pay

## 2019-12-07 DIAGNOSIS — K861 Other chronic pancreatitis: Secondary | ICD-10-CM | POA: Diagnosis not present

## 2019-12-07 DIAGNOSIS — K858 Other acute pancreatitis without necrosis or infection: Principal | ICD-10-CM | POA: Diagnosis present

## 2019-12-07 DIAGNOSIS — G8929 Other chronic pain: Secondary | ICD-10-CM | POA: Diagnosis present

## 2019-12-07 DIAGNOSIS — F17210 Nicotine dependence, cigarettes, uncomplicated: Secondary | ICD-10-CM | POA: Diagnosis present

## 2019-12-07 DIAGNOSIS — R1110 Vomiting, unspecified: Secondary | ICD-10-CM

## 2019-12-07 DIAGNOSIS — K852 Alcohol induced acute pancreatitis without necrosis or infection: Secondary | ICD-10-CM

## 2019-12-07 DIAGNOSIS — F1011 Alcohol abuse, in remission: Secondary | ICD-10-CM | POA: Diagnosis present

## 2019-12-07 DIAGNOSIS — K859 Acute pancreatitis without necrosis or infection, unspecified: Secondary | ICD-10-CM | POA: Diagnosis not present

## 2019-12-07 DIAGNOSIS — Z20822 Contact with and (suspected) exposure to covid-19: Secondary | ICD-10-CM | POA: Diagnosis present

## 2019-12-07 DIAGNOSIS — F329 Major depressive disorder, single episode, unspecified: Secondary | ICD-10-CM | POA: Diagnosis present

## 2019-12-07 DIAGNOSIS — F419 Anxiety disorder, unspecified: Secondary | ICD-10-CM | POA: Insufficient documentation

## 2019-12-07 DIAGNOSIS — K589 Irritable bowel syndrome without diarrhea: Secondary | ICD-10-CM | POA: Diagnosis present

## 2019-12-07 LAB — COMPREHENSIVE METABOLIC PANEL
ALT: 24 U/L (ref 0–44)
AST: 18 U/L (ref 15–41)
Albumin: 4.6 g/dL (ref 3.5–5.0)
Alkaline Phosphatase: 113 U/L (ref 38–126)
Anion gap: 9 (ref 5–15)
BUN: 8 mg/dL (ref 6–20)
CO2: 24 mmol/L (ref 22–32)
Calcium: 10 mg/dL (ref 8.9–10.3)
Chloride: 105 mmol/L (ref 98–111)
Creatinine, Ser: 0.91 mg/dL (ref 0.61–1.24)
GFR calc Af Amer: >60 mL/min (ref >60)
GFR calc non Af Amer: >60 mL/min (ref >60)
Glucose, Bld: 182 mg/dL — ABNORMAL HIGH (ref 70–99)
Potassium: 4 mmol/L (ref 3.5–5.1)
Sodium: 138 mmol/L (ref 135–145)
Total Bilirubin: 1.2 mg/dL (ref 0.3–1.2)
Total Protein: 7.5 g/dL (ref 6.5–8.1)

## 2019-12-07 LAB — URINALYSIS, ROUTINE W REFLEX MICROSCOPIC
Bacteria, UA: NONE SEEN
Bilirubin Urine: NEGATIVE
Glucose, UA: NEGATIVE mg/dL
Hgb urine dipstick: NEGATIVE
Ketones, ur: 20 mg/dL — AB
Leukocytes,Ua: NEGATIVE
Nitrite: NEGATIVE
Protein, ur: 30 mg/dL — AB
Specific Gravity, Urine: 1.02 (ref 1.005–1.030)
pH: 5 (ref 5.0–8.0)

## 2019-12-07 LAB — CBC
HCT: 47.1 % (ref 39.0–52.0)
Hemoglobin: 15.7 g/dL (ref 13.0–17.0)
MCH: 29 pg (ref 26.0–34.0)
MCHC: 33.3 g/dL (ref 30.0–36.0)
MCV: 86.9 fL (ref 80.0–100.0)
Platelets: 204 10*3/uL (ref 150–400)
RBC: 5.42 MIL/uL (ref 4.22–5.81)
RDW: 13 % (ref 11.5–15.5)
WBC: 8.1 10*3/uL (ref 4.0–10.5)
nRBC: 0 % (ref 0.0–0.2)

## 2019-12-07 LAB — LIPASE, BLOOD: Lipase: 93 U/L — ABNORMAL HIGH (ref 11–51)

## 2019-12-07 LAB — SARS CORONAVIRUS 2 (TAT 6-24 HRS): SARS Coronavirus 2: NEGATIVE

## 2019-12-07 MED ORDER — SODIUM CHLORIDE 0.9 % IV BOLUS
1000.0000 mL | Freq: Once | INTRAVENOUS | Status: AC
  Administered 2019-12-07: 18:00:00 1000 mL via INTRAVENOUS

## 2019-12-07 MED ORDER — NICOTINE 21 MG/24HR TD PT24
21.0000 mg | MEDICATED_PATCH | Freq: Every day | TRANSDERMAL | Status: DC
  Administered 2019-12-07 – 2019-12-10 (×4): 21 mg via TRANSDERMAL
  Filled 2019-12-07 (×4): qty 1

## 2019-12-07 MED ORDER — ONDANSETRON HCL 4 MG/2ML IJ SOLN
4.0000 mg | Freq: Once | INTRAMUSCULAR | Status: AC
  Administered 2019-12-07: 4 mg via INTRAVENOUS
  Filled 2019-12-07: qty 2

## 2019-12-07 MED ORDER — ONDANSETRON HCL 4 MG/2ML IJ SOLN
4.0000 mg | Freq: Four times a day (QID) | INTRAMUSCULAR | Status: DC | PRN
  Administered 2019-12-08 – 2019-12-10 (×6): 4 mg via INTRAVENOUS
  Filled 2019-12-07 (×6): qty 2

## 2019-12-07 MED ORDER — MORPHINE SULFATE (PF) 4 MG/ML IV SOLN
6.0000 mg | Freq: Once | INTRAVENOUS | Status: AC
  Administered 2019-12-07: 6 mg via INTRAVENOUS
  Filled 2019-12-07: qty 2

## 2019-12-07 MED ORDER — FAMOTIDINE IN NACL 20-0.9 MG/50ML-% IV SOLN
20.0000 mg | Freq: Two times a day (BID) | INTRAVENOUS | Status: DC
  Administered 2019-12-07 – 2019-12-08 (×3): 20 mg via INTRAVENOUS
  Filled 2019-12-07 (×5): qty 50

## 2019-12-07 MED ORDER — HYDROMORPHONE HCL 1 MG/ML IJ SOLN
0.5000 mg | INTRAMUSCULAR | Status: DC | PRN
  Administered 2019-12-07 – 2019-12-10 (×16): 1 mg via INTRAVENOUS
  Filled 2019-12-07 (×17): qty 1

## 2019-12-07 MED ORDER — KETOROLAC TROMETHAMINE 15 MG/ML IJ SOLN
15.0000 mg | Freq: Once | INTRAMUSCULAR | Status: AC
  Administered 2019-12-07: 15 mg via INTRAVENOUS
  Filled 2019-12-07: qty 1

## 2019-12-07 MED ORDER — LORAZEPAM 2 MG/ML IJ SOLN
0.5000 mg | Freq: Four times a day (QID) | INTRAMUSCULAR | Status: DC | PRN
  Administered 2019-12-08 – 2019-12-09 (×2): 0.5 mg via INTRAVENOUS
  Filled 2019-12-07 (×2): qty 1

## 2019-12-07 MED ORDER — ENOXAPARIN SODIUM 40 MG/0.4ML ~~LOC~~ SOLN
40.0000 mg | SUBCUTANEOUS | Status: DC
  Administered 2019-12-07: 40 mg via SUBCUTANEOUS
  Filled 2019-12-07: qty 0.4

## 2019-12-07 MED ORDER — SODIUM CHLORIDE 0.9 % IV SOLN: INTRAVENOUS | Status: AC

## 2019-12-07 MED ORDER — ACETAMINOPHEN 650 MG RE SUPP: 650.0000 mg | Freq: Four times a day (QID) | RECTAL | Status: DC | PRN

## 2019-12-07 MED ORDER — ACETAMINOPHEN 325 MG PO TABS
650.0000 mg | ORAL_TABLET | Freq: Four times a day (QID) | ORAL | Status: DC | PRN
  Administered 2019-12-08 – 2019-12-09 (×2): 650 mg via ORAL
  Filled 2019-12-07 (×2): qty 2

## 2019-12-07 MED ORDER — SODIUM CHLORIDE 0.9% FLUSH: 3.0000 mL | Freq: Once | INTRAVENOUS | Status: DC

## 2019-12-07 NOTE — ED Notes (Signed)
Report given to 5C RN. All questions answered 

## 2019-12-07 NOTE — H&P (Addendum)
History and Physical    Austin Jenkins GQQ:[id number] DOB: [birth year] DOA: 12/07/2019  PCP: [facility]   Patient coming from: Home   Chief Complaint: Epigastric pain, N/V   HPI: Austin Jenkins is a 34 y.o. male with medical history significant for alcohol abuse in remission, tobacco abuse, depression, anxiety, and pancreatitis, presenting to the emergency department with abdominal pain, nausea, and vomiting. Patient reports frequent episodes of pancreatitis, as not had any alcohol in almost a year now, but continues to have acute on chronic epigastric pain with nausea and vomiting and was most recently admitted for this in December 2020. He had been in his usual state until last night when he developed pain in the epigastrium with nausea and recurrent episodes of nonbloody vomiting. Symptoms are very similar to his prior episodes. He denies any fevers, chills, cough, shortness of breath, and had a normal bowel movement earlier today.  ED Course: Upon arrival to the ED, patient is found to be afebrile, saturating well on room air, tachycardic, and with stable blood pressure. CBC is unremarkable, chemistries with lipase 93, and urinalysis notable for ketonuria. Patient was given IV fluids, antiemetics, and analgesics in the ED.  Review of Systems:  All other systems reviewed and apart from HPI, are negative.  Past Medical History:  Diagnosis Date  . Alcohol abuse   . Anxiety   . Depression   . IBS (irritable bowel syndrome)   . Pancreatitis     Past Surgical History:  Procedure Laterality Date  . CHOLECYSTECTOMY       reports that he has been smoking. He has been smoking about 0.50 packs per day. He uses smokeless tobacco. He reports previous alcohol use of about 42.0 standard drinks of alcohol per week. No history on file for drug.  Allergies  Allergen Reactions  . Other Anaphylaxis    mayonnaise    History reviewed. No pertinent family history.   Prior to  Admission medications   Medication Sig Start Date End Date Taking? Authorizing Provider  ALPRAZolam Prudy Feeler) 1 MG tablet Take 1 mg by mouth 2 (two) times daily as needed for anxiety.   Yes [provider]  buPROPion (WELLBUTRIN SR) 100 MG 12 hr tablet Take 100 mg by mouth daily. 03/28/19  Yes [provider]  omeprazole (PRILOSEC OTC) 20 MG tablet Take 20 mg by mouth daily.   Yes [provider]  Oxycodone HCl 10 MG TABS Take 10 mg by mouth 4 (four) times daily as needed (pain).   Yes [provider]  dicyclomine (BENTYL) 20 MG tablet Take 1 tablet (20 mg total) by mouth 2 (two) times daily as needed for spasms. Patient not taking: Reported on 12/07/2019 09/05/19   Caccavale, Sophia, PA-C  lidocaine (XYLOCAINE) 2 % solution Use as directed 15 mLs in the mouth or throat every 6 (six) hours as needed for mouth pain. Patient not taking: Reported on 12/07/2019 09/22/19   Nira Conn, MD  ondansetron (ZOFRAN ODT) 4 MG disintegrating tablet Take 1 tablet (4 mg total) by mouth every 8 (eight) hours as needed for nausea or vomiting. Patient not taking: Reported on 12/07/2019 09/05/19   Caccavale, Sophia, PA-C  ondansetron (ZOFRAN) 4 MG tablet Take 1 tablet (4 mg total) by mouth every 6 (six) hours as needed for nausea or vomiting. Patient not taking: Reported on 09/02/2019 04/03/19   Gilda Crease, MD  polyethylene glycol powder (MIRALAX) 17 GM/SCOOP powder Start taking 1 capful 3 times a day.  Slowly cut back as needed until you have normal bowel movements. Stay on 1 capful per day while on narcotic pain medication Patient not taking: Reported on 12/07/2019 09/22/19   Fatima Blank, MD  sucralfate (CARAFATE) 1 GM/10ML suspension Take 10 mLs (1 g total) by mouth 4 (four) times daily -  with meals and at bedtime. Patient not taking: Reported on 12/07/2019 11/10/19   Franchot Heidelberg, PA-C    Physical Exam: Vitals:   12/07/19 1815 12/07/19 1830 12/07/19 1845  12/07/19 1900  BP: 121/80 111/74 109/78 102/69  Pulse: 66 64 71 81  Resp:      Temp:      TempSrc:      SpO2: 99% 99% 99% 99%     Constitutional: NAD, calm  Eyes: PERTLA, lids and conjunctivae normal ENMT: Mucous membranes are moist. Posterior pharynx clear of any exudate or lesions.   Neck: normal, supple, no masses, no thyromegaly Respiratory: clear to auscultation bilaterally, no wheezing, no crackles. No accessory muscle use.  Cardiovascular: S1 & S2 heard, regular rate and rhythm. No extremity edema.  Abdomen: No distension, tender in epigastrium, no rebound pain or tenderness. Bowel sounds active.  Musculoskeletal: no clubbing / cyanosis. No joint deformity upper and lower extremities.   Skin: no significant rashes, lesions, ulcers. Warm, dry, well-perfused. Neurologic: CN 2-12 grossly intact. Sensation intact, DTR normal. Strength 5/5 in all 4 limbs.  Psychiatric: Alert and oriented x 3. Pleasant and cooperative.    Labs and Imaging on Admission: I have personally reviewed following labs and imaging studies  CBC: Recent Labs  Lab 12/07/19 1437  WBC 8.1  HGB 15.7  HCT 47.1  MCV 86.9  PLT 992   Basic Metabolic Panel: Recent Labs  Lab 12/07/19 1437  NA 138  K 4.0  CL 105  CO2 24  GLUCOSE 182*  BUN 8  CREATININE 0.91  CALCIUM 10.0   GFR: CrCl cannot be calculated (Unknown ideal weight.). Liver Function Tests: Recent Labs  Lab 12/07/19 1437  AST 18  ALT 24  ALKPHOS 113  BILITOT 1.2  PROT 7.5  ALBUMIN 4.6   Recent Labs  Lab 12/07/19 1437  LIPASE 93*   No results for input(s): AMMONIA in the last 168 hours. Coagulation Profile: No results for input(s): INR, PROTIME in the last 168 hours. Cardiac Enzymes: No results for input(s): CKTOTAL, CKMB, CKMBINDEX, TROPONINI in the last 168 hours. BNP (last 3 results) No results for input(s): PROBNP in the last 8760 hours. HbA1C: No results for input(s): HGBA1C in the last 72 hours. CBG: No results for  input(s): GLUCAP in the last 168 hours. Lipid Profile: No results for input(s): CHOL, HDL, LDLCALC, TRIG, CHOLHDL, LDLDIRECT in the last 72 hours. Thyroid Function Tests: No results for input(s): TSH, T4TOTAL, FREET4, T3FREE, THYROIDAB in the last 72 hours. Anemia Panel: No results for input(s): VITAMINB12, FOLATE, FERRITIN, TIBC, IRON, RETICCTPCT in the last 72 hours. Urine analysis:    Component Value Date/Time   COLORURINE YELLOW 12/07/2019 1650   APPEARANCEUR HAZY (A) 12/07/2019 1650   LABSPEC 1.020 12/07/2019 1650   PHURINE 5.0 12/07/2019 1650   GLUCOSEU NEGATIVE 12/07/2019 1650   HGBUR NEGATIVE 12/07/2019 1650   BILIRUBINUR NEGATIVE 12/07/2019 1650   KETONESUR 20 (A) 12/07/2019 1650   PROTEINUR 30 (A) 12/07/2019 1650   NITRITE NEGATIVE 12/07/2019 1650   LEUKOCYTESUR NEGATIVE 12/07/2019 1650   Sepsis Labs: @LABRCNTIP (procalcitonin:4,lacticidven:4) )No results found for this or any previous visit (from the past 240 hour(s)).  Radiological Exams on Admission: No results found.   Assessment/Plan   1. Acute on chronic pancreatitis  - Presents with 24 hours of epigastric pain and N/V similar to his prior episodes of acute pancreatitis  - Severity scores are low  - He follows with [facility] and was planned for repeat imaging next month  - Plan to continue bowel-rest, IVF hydration, pain-control    DVT prophylaxis: Lovenox  Code Status: Full  Family Communication: Discussed with patient  Disposition Plan: Likely back home in 24-48 hrs pending clinical improvement  Consults called: None  Admission status: Observation     Briscoe Deutscher, MD [facility] Pager: See [email or web address]  If 7AM-7PM, please contact the daytime attending [email or web address]  12/07/2019, 7:17 PM

## 2019-12-07 NOTE — ED Triage Notes (Signed)
Pt presents w?upper abd pain, N/V starting last night, hx of pancreatitis

## 2019-12-07 NOTE — ED Provider Notes (Signed)
[facility] EMERGENCY DEPARTMENT Provider Note   CSN: [id number] Arrival date & time: 12/07/19  1413     History Chief Complaint  Patient presents with  . Abdominal Pain    Austin Jenkins is a 34 y.o. male.  Patient with history of alcohol abuse last drink in April 2020, recurrent pancreatitis, irritable bowel, admissions in the past for pancreatitis presents with worsening abdominal pain since yesterday feels similar to his pancreatitis history mild radiation to the back.  No fever chills.  Patient's had vomiting nonbloody.  No blood in the stools.  No Covid contacts.        Past Medical History:  Diagnosis Date  . Alcohol abuse   . Anxiety   . Depression   . IBS (irritable bowel syndrome)   . Pancreatitis     Patient Active Problem List   Diagnosis Date Noted  . Anxiety   . Epigastric pain   . Acute on chronic pancreatitis (HCC) 08/09/2019  . Acute alcoholic pancreatitis 10/24/2018    Past Surgical History:  Procedure Laterality Date  . CHOLECYSTECTOMY         History reviewed. No pertinent family history.  Social History   Tobacco Use  . Smoking status: Current Every Day Smoker    Packs/day: 0.50  . Smokeless tobacco: Current User  Substance Use Topics  . Alcohol use: Not Currently    Alcohol/week: 42.0 standard drinks    Types: 42 Cans of beer per week    Comment: pt states he has not had a drinik since april  . Drug use: Not on file    Home Medications Prior to Admission medications   Medication Sig Start Date End Date Taking? Authorizing Provider  ALPRAZolam Prudy Feeler) 1 MG tablet Take 1 mg by mouth 2 (two) times daily as needed for anxiety.   Yes [provider]  buPROPion (WELLBUTRIN SR) 100 MG 12 hr tablet Take 100 mg by mouth daily. 03/28/19  Yes [provider]  omeprazole (PRILOSEC OTC) 20 MG tablet Take 20 mg by mouth daily.   Yes [provider]  Oxycodone HCl 10 MG TABS Take 10 mg by mouth 4  (four) times daily as needed (pain).   Yes [provider]  dicyclomine (BENTYL) 20 MG tablet Take 1 tablet (20 mg total) by mouth 2 (two) times daily as needed for spasms. Patient not taking: Reported on 12/07/2019 09/05/19   Caccavale, Sophia, PA-C  lidocaine (XYLOCAINE) 2 % solution Use as directed 15 mLs in the mouth or throat every 6 (six) hours as needed for mouth pain. Patient not taking: Reported on 12/07/2019 09/22/19   Nira Conn, MD  ondansetron (ZOFRAN ODT) 4 MG disintegrating tablet Take 1 tablet (4 mg total) by mouth every 8 (eight) hours as needed for nausea or vomiting. Patient not taking: Reported on 12/07/2019 09/05/19   Caccavale, Sophia, PA-C  ondansetron (ZOFRAN) 4 MG tablet Take 1 tablet (4 mg total) by mouth every 6 (six) hours as needed for nausea or vomiting. Patient not taking: Reported on 09/02/2019 04/03/19   Gilda Crease, MD  polyethylene glycol powder (MIRALAX) 17 GM/SCOOP powder Start taking 1 capful 3 times a day. Slowly cut back as needed until you have normal bowel movements. Stay on 1 capful per day while on narcotic pain medication Patient not taking: Reported on 12/07/2019 09/22/19   Nira Conn, MD  sucralfate (CARAFATE) 1 GM/10ML suspension Take 10 mLs (1 g total) by mouth 4 (four)  times daily -  with meals and at bedtime. Patient not taking: Reported on 12/07/2019 11/10/19   Caccavale, Sophia, PA-C    Allergies    Other  Review of Systems   Review of Systems  Constitutional: Negative for chills and fever.  HENT: Negative for congestion.   Eyes: Negative for visual disturbance.  Respiratory: Negative for shortness of breath.   Cardiovascular: Negative for chest pain.  Gastrointestinal: Positive for abdominal pain, nausea and vomiting. Negative for diarrhea.  Genitourinary: Negative for dysuria and flank pain.  Musculoskeletal: Negative for back pain, neck pain and neck stiffness.  Skin: Negative for rash.  Neurological:  Negative for light-headedness and headaches.    Physical Exam Updated Vital Signs BP 102/69   Pulse 81   Temp 97.7 F (36.5 C) (Oral)   Resp 12   SpO2 99%   Physical Exam Vitals and nursing note reviewed.  Constitutional:      Appearance: He is well-developed.  HENT:     Head: Normocephalic and atraumatic.     Comments: Dry mm Eyes:     General:        Right eye: No discharge.        Left eye: No discharge.     Conjunctiva/sclera: Conjunctivae normal.  Neck:     Trachea: No tracheal deviation.  Cardiovascular:     Rate and Rhythm: Normal rate and regular rhythm.  Pulmonary:     Effort: Pulmonary effort is normal.     Breath sounds: Normal breath sounds.  Abdominal:     General: There is no distension.     Palpations: Abdomen is soft.     Tenderness: There is generalized abdominal tenderness. There is no guarding.  Musculoskeletal:     Cervical back: Normal range of motion and neck supple.  Skin:    General: Skin is warm.     Findings: No rash.  Neurological:     Mental Status: He is alert and oriented to person, place, and time.     ED Results / Procedures / Treatments   Labs (all labs ordered are listed, but only abnormal results are displayed) Labs Reviewed  LIPASE, BLOOD - Abnormal; Notable for the following components:      Result Value   Lipase 93 (*)    All other components within normal limits  COMPREHENSIVE METABOLIC PANEL - Abnormal; Notable for the following components:   Glucose, Bld 182 (*)    All other components within normal limits  URINALYSIS, ROUTINE W REFLEX MICROSCOPIC - Abnormal; Notable for the following components:   APPearance HAZY (*)    Ketones, ur 20 (*)    Protein, ur 30 (*)    All other components within normal limits  SARS CORONAVIRUS 2 (TAT 6-24 HRS)  CBC    EKG None  Radiology No results found.  Procedures Procedures (including critical care time)  Medications Ordered in ED Medications  sodium chloride flush  (NS) 0.9 % injection 3 mL (has no administration in time range)  nicotine (NICODERM CQ - dosed in mg/24 hours) patch 21 mg (21 mg Transdermal Patch Applied 12/07/19 1745)  ondansetron (ZOFRAN) injection 4 mg (has no administration in time range)  HYDROmorphone (DILAUDID) injection 0.5-1 mg (has no administration in time range)  0.9 %  sodium chloride infusion (has no administration in time range)  sodium chloride 0.9 % bolus 1,000 mL (1,000 mLs Intravenous New Bag/Given 12/07/19 1731)  morphine 4 MG/ML injection 6 mg (6 mg Intravenous Given 12/07/19 1736)  ondansetron (ZOFRAN) injection 4 mg (4 mg Intravenous Given 12/07/19 1731)  ketorolac (TORADOL) 15 MG/ML injection 15 mg (15 mg Intravenous Given 12/07/19 1734)    ED Course  I have reviewed the triage vital signs and the nursing notes.  Pertinent labs & imaging results that were available during my care of the patient were reviewed by me and considered in my medical decision making (see chart for details).    MDM Rules/Calculators/A&P                      Patient with history of recurrent pancreatitis presents with similar presentation.  Patient dry clinically with mild tachycardia, dry mucous membranes.  IV fluid bolus, morphine and Toradol ordered.  Lipase mild elevated in the 90s.  Patient unable to tolerate oral for 24 hours.  Plan for observation in the hospital.  No fever, no guarding, no indication for emergent CT scan at this time.  White blood cell count normal, urinalysis pending, dark in the room.  COVID test sent.  Urinalysis no sign of infection.  Discussed with hospitalist for observation for pain control and vomiting treatment.    Final Clinical Impression(s) / ED Diagnoses Final diagnoses:  Alcohol-induced acute pancreatitis without infection or necrosis  Vomiting in adult    Rx / DC Orders ED Discharge Orders    None       Elnora Morrison, MD 12/07/19 [place]

## 2019-12-07 NOTE — ED Triage Notes (Signed)
Pt reports he hs not drank any alcohol since April 2020

## 2019-12-08 ENCOUNTER — Observation Stay (HOSPITAL_COMMUNITY): Payer: Medicaid Other

## 2019-12-08 DIAGNOSIS — Z20822 Contact with and (suspected) exposure to covid-19: Secondary | ICD-10-CM | POA: Diagnosis present

## 2019-12-08 DIAGNOSIS — F17210 Nicotine dependence, cigarettes, uncomplicated: Secondary | ICD-10-CM | POA: Diagnosis present

## 2019-12-08 DIAGNOSIS — F419 Anxiety disorder, unspecified: Secondary | ICD-10-CM | POA: Diagnosis present

## 2019-12-08 DIAGNOSIS — K589 Irritable bowel syndrome without diarrhea: Secondary | ICD-10-CM | POA: Diagnosis present

## 2019-12-08 DIAGNOSIS — F329 Major depressive disorder, single episode, unspecified: Secondary | ICD-10-CM | POA: Diagnosis present

## 2019-12-08 DIAGNOSIS — G8929 Other chronic pain: Secondary | ICD-10-CM | POA: Diagnosis present

## 2019-12-08 DIAGNOSIS — K861 Other chronic pancreatitis: Secondary | ICD-10-CM | POA: Diagnosis not present

## 2019-12-08 DIAGNOSIS — K858 Other acute pancreatitis without necrosis or infection: Secondary | ICD-10-CM | POA: Diagnosis present

## 2019-12-08 DIAGNOSIS — R1110 Vomiting, unspecified: Secondary | ICD-10-CM | POA: Diagnosis not present

## 2019-12-08 DIAGNOSIS — F1011 Alcohol abuse, in remission: Secondary | ICD-10-CM | POA: Diagnosis present

## 2019-12-08 DIAGNOSIS — K859 Acute pancreatitis without necrosis or infection, unspecified: Secondary | ICD-10-CM | POA: Diagnosis not present

## 2019-12-08 LAB — COMPREHENSIVE METABOLIC PANEL
ALT: 19 U/L (ref 0–44)
AST: 15 U/L (ref 15–41)
Albumin: 3.4 g/dL — ABNORMAL LOW (ref 3.5–5.0)
Alkaline Phosphatase: 82 U/L (ref 38–126)
Anion gap: 11 (ref 5–15)
BUN: 12 mg/dL (ref 6–20)
CO2: 21 mmol/L — ABNORMAL LOW (ref 22–32)
Calcium: 8.9 mg/dL (ref 8.9–10.3)
Chloride: 108 mmol/L (ref 98–111)
Creatinine, Ser: 0.9 mg/dL (ref 0.61–1.24)
GFR calc Af Amer: >60 mL/min (ref >60)
GFR calc non Af Amer: >60 mL/min (ref >60)
Glucose, Bld: 101 mg/dL — ABNORMAL HIGH (ref 70–99)
Potassium: 3.7 mmol/L (ref 3.5–5.1)
Sodium: 140 mmol/L (ref 135–145)
Total Bilirubin: 1.1 mg/dL (ref 0.3–1.2)
Total Protein: 5.9 g/dL — ABNORMAL LOW (ref 6.5–8.1)

## 2019-12-08 LAB — CBC WITH DIFFERENTIAL/PLATELET
Abs Immature Granulocytes: 0.01 10*3/uL (ref 0.00–0.07)
Basophils Absolute: 0 10*3/uL (ref 0.0–0.1)
Basophils Relative: 1 %
Eosinophils Absolute: 0.2 10*3/uL (ref 0.0–0.5)
Eosinophils Relative: 3 %
HCT: 37.6 % — ABNORMAL LOW (ref 39.0–52.0)
Hemoglobin: 12.4 g/dL — ABNORMAL LOW (ref 13.0–17.0)
Immature Granulocytes: 0 %
Lymphocytes Relative: 25 %
Lymphs Abs: 1.6 10*3/uL (ref 0.7–4.0)
MCH: 29.2 pg (ref 26.0–34.0)
MCHC: 33 g/dL (ref 30.0–36.0)
MCV: 88.7 fL (ref 80.0–100.0)
Monocytes Absolute: 0.5 10*3/uL (ref 0.1–1.0)
Monocytes Relative: 8 %
Neutro Abs: 4.2 10*3/uL (ref 1.7–7.7)
Neutrophils Relative %: 63 %
Platelets: 128 10*3/uL — ABNORMAL LOW (ref 150–400)
RBC: 4.24 MIL/uL (ref 4.22–5.81)
RDW: 13 % (ref 11.5–15.5)
WBC: 6.5 10*3/uL (ref 4.0–10.5)
nRBC: 0 % (ref 0.0–0.2)

## 2019-12-08 MED ORDER — IOHEXOL 300 MG/ML  SOLN
100.0000 mL | Freq: Once | INTRAMUSCULAR | Status: AC | PRN
  Administered 2019-12-08: 100 mL via INTRAVENOUS

## 2019-12-08 NOTE — Progress Notes (Signed)
PROGRESS NOTE    Austin Jenkins  [id number]:[id number] DOB: [birth year] DOA: 12/07/2019 PCP: [facility]   Brief Narrative:  HPI: Austin Jenkins is a 34 y.o. male with medical history significant for alcohol abuse in remission, tobacco abuse, depression, anxiety, and pancreatitis, presenting to the emergency department with abdominal pain, nausea, and vomiting. Patient reports frequent episodes of pancreatitis, as not had any alcohol in almost a year now, but continues to have acute on chronic epigastric pain with nausea and vomiting and was most recently admitted for this in December 2020. He had been in his usual state until last night when he developed pain in the epigastrium with nausea and recurrent episodes of nonbloody vomiting. Symptoms are very similar to his prior episodes. He denies any fevers, chills, cough, shortness of breath, and had a normal bowel movement earlier today.  ED Course: Upon arrival to the ED, patient is found to be afebrile, saturating well on room air, tachycardic, and with stable blood pressure. CBC is unremarkable, chemistries with lipase 93, and urinalysis notable for ketonuria. Patient was given IV fluids, antiemetics, and analgesics in the ED.  Assessment & Plan:   Principal Problem:   Acute on chronic pancreatitis (HCC)   Acute on chronic and recurrent pancreatitis: Patient's pain is improved.  6 out of 10 instead of 10 out of 10.  No other complaint.  Will advance to clear liquid diet.  Lipase only minimally elevated, not elevated enough to call it in acute pancreatitis and no CT scans were done.  We will proceed with CT abdomen pelvis to establish a proper diagnosis.  Continue current pain management.  DVT prophylaxis: SCD   Code Status: Full Code  Family Communication:  None present at bedside.  Plan of care discussed with patient in length and he verbalized understanding and agreed with it. Patient is from: Home Disposition Plan: Home once  improved clinically Barriers to discharge: Persistent abdominal pain needing IV pain medications   Estimated body mass index is 19.8 kg/m as calculated from the following:   Height as of 09/21/19: 5\' 10"  (1.778 m).   Weight as of 09/21/19: 62.6 kg.      Nutritional status:               Consultants:   None  Procedures:   None  Antimicrobials:   None   Subjective: Seen and examined.  Still with abdominal pain.  No nausea.  Objective: Vitals:   12/07/19 2030 12/07/19 2102 12/08/19 0021 12/08/19 0445  BP: 104/78 121/79 106/71 103/67  Pulse: 64 61 69 61  Resp:  18 18 18   Temp:  98.1 F (36.7 C) 98 F (36.7 C) 98 F (36.7 C)  TempSrc:  Oral Oral Oral  SpO2: 97% 100% 98% 98%    Intake/Output Summary (Last 24 hours) at 12/08/2019 1319 Last data filed at 12/08/2019 0500 Gross per 24 hour  Intake 0 ml  Output --  Net 0 ml   There were no vitals filed for this visit.  Examination:  General exam: Appears calm and comfortable  Respiratory system: Clear to auscultation. Respiratory effort normal. Cardiovascular system: S1 & S2 heard, RRR. No JVD, murmurs, rubs, gallops or clicks. No pedal edema. Gastrointestinal system: Abdomen is nondistended, soft and left upper quadrant, epigastric and right upper quadrant tenderness. No organomegaly or masses felt. Normal bowel sounds heard. Central nervous system: Alert and oriented. No focal neurological deficits. Extremities: Symmetric 5 x 5 power. Skin: No rashes, lesions or ulcers Psychiatry:  Judgement and insight appear normal. Mood & affect appropriate.    Data Reviewed: I have personally reviewed following labs and imaging studies  CBC: Recent Labs  Lab 12/07/19 1437 12/08/19 0528  WBC 8.1 6.5  NEUTROABS  --  4.2  HGB 15.7 12.4*  HCT 47.1 37.6*  MCV 86.9 88.7  PLT 204 161*   Basic Metabolic Panel: Recent Labs  Lab 12/07/19 1437 12/08/19 0528  NA 138 140  K 4.0 3.7  CL 105 108  CO2 24 21*   GLUCOSE 182* 101*  BUN 8 12  CREATININE 0.91 0.90  CALCIUM 10.0 8.9   GFR: CrCl cannot be calculated (Unknown ideal weight.). Liver Function Tests: Recent Labs  Lab 12/07/19 1437 12/08/19 0528  AST 18 15  ALT 24 19  ALKPHOS 113 82  BILITOT 1.2 1.1  PROT 7.5 5.9*  ALBUMIN 4.6 3.4*   Recent Labs  Lab 12/07/19 1437  LIPASE 93*   No results for input(s): AMMONIA in the last 168 hours. Coagulation Profile: No results for input(s): INR, PROTIME in the last 168 hours. Cardiac Enzymes: No results for input(s): CKTOTAL, CKMB, CKMBINDEX, TROPONINI in the last 168 hours. BNP (last 3 results) No results for input(s): PROBNP in the last 8760 hours. HbA1C: No results for input(s): HGBA1C in the last 72 hours. CBG: No results for input(s): GLUCAP in the last 168 hours. Lipid Profile: No results for input(s): CHOL, HDL, LDLCALC, TRIG, CHOLHDL, LDLDIRECT in the last 72 hours. Thyroid Function Tests: No results for input(s): TSH, T4TOTAL, FREET4, T3FREE, THYROIDAB in the last 72 hours. Anemia Panel: No results for input(s): VITAMINB12, FOLATE, FERRITIN, TIBC, IRON, RETICCTPCT in the last 72 hours. Sepsis Labs: No results for input(s): PROCALCITON, LATICACIDVEN in the last 168 hours.  Recent Results (from the past 240 hour(s))  SARS CORONAVIRUS 2 (TAT 6-24 HRS) Nasopharyngeal Nasopharyngeal Swab     Status: None   Collection Time: 12/07/19  5:39 PM   Specimen: Nasopharyngeal Swab  Result Value Ref Range Status   SARS Coronavirus 2 NEGATIVE NEGATIVE Final    Comment: (NOTE) SARS-CoV-2 target nucleic acids are NOT DETECTED. The SARS-CoV-2 RNA is generally detectable in upper and lower respiratory specimens during the acute phase of infection. Negative results do not preclude SARS-CoV-2 infection, do not rule out co-infections with other pathogens, and should not be used as the sole basis for treatment or other patient management decisions. Negative results must be combined with  clinical observations, patient history, and epidemiological information. The expected result is Negative. Fact Sheet for Patients: SugarRoll.be Fact Sheet for Healthcare Providers: https://www.woods-mathews.com/ This test is not yet approved or cleared by the [place] FDA and  has been authorized for detection and/or diagnosis of SARS-CoV-2 by FDA under an Emergency Use Authorization (EUA). This EUA will remain  in effect (meaning this test can be used) for the duration of the COVID-19 declaration under Section 56 4(b)(1) of the Act, 21 U.S.C. section 360bbb-3(b)(1), unless the authorization is terminated or revoked sooner. Performed at [facility], [place] [address] [place], [place] [address]       Radiology Studies: No results found.  Scheduled Meds: . nicotine  21 mg Transdermal Daily  . sodium chloride flush  3 mL Intravenous Once   Continuous Infusions: . famotidine (PEPCID) IV 20 mg (12/08/19 0831)     LOS: 0 days   Time spent: 30 minutes   Darliss Cheney, MD [facility]  12/08/2019, 1:19 PM   To contact the attending  provider between 7A-7P or the covering provider during after hours 7P-7A, please log into the web site [email or web address].

## 2019-12-09 LAB — BASIC METABOLIC PANEL
Anion gap: 10 (ref 5–15)
BUN: 9 mg/dL (ref 6–20)
CO2: 23 mmol/L (ref 22–32)
Calcium: 8.9 mg/dL (ref 8.9–10.3)
Chloride: 106 mmol/L (ref 98–111)
Creatinine, Ser: 0.85 mg/dL (ref 0.61–1.24)
GFR calc Af Amer: >60 mL/min (ref >60)
GFR calc non Af Amer: >60 mL/min (ref >60)
Glucose, Bld: 89 mg/dL (ref 70–99)
Potassium: 3.7 mmol/L (ref 3.5–5.1)
Sodium: 139 mmol/L (ref 135–145)

## 2019-12-09 MED ORDER — FAMOTIDINE 20 MG PO TABS
20.0000 mg | ORAL_TABLET | Freq: Two times a day (BID) | ORAL | Status: DC
  Administered 2019-12-09 – 2019-12-10 (×3): 20 mg via ORAL
  Filled 2019-12-09 (×3): qty 1

## 2019-12-09 MED ORDER — OXYCODONE HCL 5 MG PO TABS
10.0000 mg | ORAL_TABLET | Freq: Three times a day (TID) | ORAL | Status: DC | PRN
  Administered 2019-12-09 – 2019-12-10 (×2): 10 mg via ORAL
  Filled 2019-12-09 (×2): qty 2

## 2019-12-09 NOTE — Progress Notes (Signed)
Pt complains of 10/10 abd pain. Pt asking for increase to dilaudid dose and states he takes oxy10mg  po 4x daily at home for his pain. Pt states current dose is only lasting about 2 hrs.  Notified MD.. will cont to monitor.

## 2019-12-09 NOTE — Progress Notes (Signed)
PROGRESS NOTE    Austin Jenkins  DJT:[id number] DOB: May 27, 1986 DOA: 12/07/2019 PCP: [facility]   Brief Narrative:  HPI: Austin Jenkins is a 34 y.o. male with medical history significant for alcohol abuse in remission, tobacco abuse, depression, anxiety, and pancreatitis, presenting to the emergency department with abdominal pain, nausea, and vomiting. Patient reports frequent episodes of pancreatitis, as not had any alcohol in almost a year now, but continues to have acute on chronic epigastric pain with nausea and vomiting and was most recently admitted for this in December 2020. He had been in his usual state until last night when he developed pain in the epigastrium with nausea and recurrent episodes of nonbloody vomiting. Symptoms are very similar to his prior episodes. He denies any fevers, chills, cough, shortness of breath, and had a normal bowel movement earlier today.  ED Course: Upon arrival to the ED, patient is found to be afebrile, saturating well on room air, tachycardic, and with stable blood pressure. CBC is unremarkable, chemistries with lipase 93, and urinalysis notable for ketonuria. Patient was given IV fluids, antiemetics, and analgesics in the ED.  Assessment & Plan:   Principal Problem:   Acute on chronic pancreatitis (HCC)   Acute on chronic and recurrent pancreatitis: Patient continues to have pain which is 8 out of 10 with no improvement compared to yesterday.  When I saw him this morning, he denied any nausea or vomiting.  I advanced his diet from full liquid diet to soft diet but then soon after I left the room, RN paged me that patient had vomited x2.  His diet was backed off to full liquid.  We will continue antiemetics and current pain management.  CT abdomen shows acute pancreatitis but no other pathology.   DVT prophylaxis: SCD   Code Status: Full Code  Family Communication:  None present at bedside.  Plan of care discussed with patient in length  and he verbalized understanding and agreed with it. Patient is from: Home Disposition Plan: Home once improved clinically Barriers to discharge: Persistent abdominal pain and vomiting needing IV pain medications   Estimated body mass index is 19.8 kg/m as calculated from the following:   Height as of 09/21/19: 5\' 10"  (1.778 m).   Weight as of 09/21/19: 62.6 kg.      Nutritional status:               Consultants:   None  Procedures:   None  Antimicrobials:   None   Subjective: Seen and examined this morning.  Continued to have same abdominal pain with no improvement.  Did not have nausea before but then vomited x2 as mentioned above.  Objective: Vitals:   12/08/19 1828 12/09/19 0025 12/09/19 0507 12/09/19 1228  BP: 109/65 113/75 112/80 105/69  Pulse: 70 (!) 55 (!) 54 63  Resp: 18 17 17 16   Temp: 98.4 F (36.9 C) 97.8 F (36.6 C) (!) 97.5 F (36.4 C) 98.2 F (36.8 C)  TempSrc: Oral Oral Oral Oral  SpO2: 98% 100% 100% 98%    Intake/Output Summary (Last 24 hours) at 12/09/2019 1255 Last data filed at 12/09/2019 0900 Gross per 24 hour  Intake 2205.83 ml  Output --  Net 2205.83 ml   There were no vitals filed for this visit.  Examination:  General exam: Appears calm and comfortable  Respiratory system: Clear to auscultation. Respiratory effort normal. Cardiovascular system: S1 & S2 heard, RRR. No JVD, murmurs, rubs, gallops or clicks. No pedal edema.  Gastrointestinal system: Abdomen is nondistended, soft and right upper quadrant, left upper quadrant and epigastric tenderness. No organomegaly or masses felt. Normal bowel sounds heard. Central nervous system: Alert and oriented. No focal neurological deficits. Extremities: Symmetric 5 x 5 power. Skin: No rashes, lesions or ulcers.  Psychiatry: Judgement and insight appear normal. Mood & affect appropriate.    Data Reviewed: I have personally reviewed following labs and imaging studies  CBC: Recent  Labs  Lab 12/07/19 1437 12/08/19 0528  WBC 8.1 6.5  NEUTROABS  --  4.2  HGB 15.7 12.4*  HCT 47.1 37.6*  MCV 86.9 88.7  PLT 204 128*   Basic Metabolic Panel: Recent Labs  Lab 12/07/19 1437 12/08/19 0528 12/09/19 0304  NA 138 140 139  K 4.0 3.7 3.7  CL 105 108 106  CO2 24 21* 23  GLUCOSE 182* 101* 89  BUN 8 12 9   CREATININE 0.91 0.90 0.85  CALCIUM 10.0 8.9 8.9   GFR: CrCl cannot be calculated (Unknown ideal weight.). Liver Function Tests: Recent Labs  Lab 12/07/19 1437 12/08/19 0528  AST 18 15  ALT 24 19  ALKPHOS 113 82  BILITOT 1.2 1.1  PROT 7.5 5.9*  ALBUMIN 4.6 3.4*   Recent Labs  Lab 12/07/19 1437  LIPASE 93*   No results for input(s): AMMONIA in the last 168 hours. Coagulation Profile: No results for input(s): INR, PROTIME in the last 168 hours. Cardiac Enzymes: No results for input(s): CKTOTAL, CKMB, CKMBINDEX, TROPONINI in the last 168 hours. BNP (last 3 results) No results for input(s): PROBNP in the last 8760 hours. HbA1C: No results for input(s): HGBA1C in the last 72 hours. CBG: No results for input(s): GLUCAP in the last 168 hours. Lipid Profile: No results for input(s): CHOL, HDL, LDLCALC, TRIG, CHOLHDL, LDLDIRECT in the last 72 hours. Thyroid Function Tests: No results for input(s): TSH, T4TOTAL, FREET4, T3FREE, THYROIDAB in the last 72 hours. Anemia Panel: No results for input(s): VITAMINB12, FOLATE, FERRITIN, TIBC, IRON, RETICCTPCT in the last 72 hours. Sepsis Labs: No results for input(s): PROCALCITON, LATICACIDVEN in the last 168 hours.  Recent Results (from the past 240 hour(s))  SARS CORONAVIRUS 2 (TAT 6-24 HRS) Nasopharyngeal Nasopharyngeal Swab     Status: None   Collection Time: 12/07/19  5:39 PM   Specimen: Nasopharyngeal Swab  Result Value Ref Range Status   SARS Coronavirus 2 NEGATIVE NEGATIVE Final    Comment: (NOTE) SARS-CoV-2 target nucleic acids are NOT DETECTED. The SARS-CoV-2 RNA is generally detectable in upper  and lower respiratory specimens during the acute phase of infection. Negative results do not preclude SARS-CoV-2 infection, do not rule out co-infections with other pathogens, and should not be used as the sole basis for treatment or other patient management decisions. Negative results must be combined with clinical observations, patient history, and epidemiological information. The expected result is Negative. Fact Sheet for Patients: 02/06/20 Fact Sheet for Healthcare Providers: HairSlick.no This test is not yet approved or cleared by the [place] FDA and  has been authorized for detection and/or diagnosis of SARS-CoV-2 by FDA under an Emergency Use Authorization (EUA). This EUA will remain  in effect (meaning this test can be used) for the duration of the COVID-19 declaration under Section 56 4(b)(1) of the Act, 21 U.S.C. section 360bbb-3(b)(1), unless the authorization is terminated or revoked sooner. Performed at [facility], [address] [place], [place]       Radiology Studies: CT ABDOMEN PELVIS W CONTRAST  Result Date: 12/08/2019 CLINICAL DATA:  Abdominal pain and nausea and vomiting. Recurrent pancreatitis. EXAM: CT ABDOMEN AND PELVIS WITH CONTRAST TECHNIQUE: Multidetector CT imaging of the abdomen and pelvis was performed using the standard protocol following bolus administration of intravenous contrast. CONTRAST:  OMNIPAQUE IOHEXOL 300 MG/ML  SOLN COMPARISON:  MRI on 10/25/2019 from [facility] FINDINGS: Lower Chest: No acute findings. Hepatobiliary: Probable tiny sub-cm cyst in the left hepatic lobe is too small to characterize but. No suspicious hepatic masses identified. Prior cholecystectomy. No evidence of biliary obstruction. Pancreas: Mild peripancreatic inflammatory changes are seen with reactive thickening of the posterior wall of the stomach, consistent with acute  pancreatitis. 1.4 cm cystic area in the pancreatic neck and 3.5 x 1.6 cm cystic lesion in the pancreatic tail show no significant change, and are consistent with small pseudocysts. No new or enlarging pseudocysts identified. Spleen: Within normal limits in size and appearance. Adrenals/Urinary Tract: No masses identified. No evidence of ureteral calculi or hydronephrosis. Stomach/Bowel: Thickening of the posterior wall of the stomach is seen and attributable to acute pancreatitis. No areas of bowel wall thickening or bowel obstruction. Vascular/Lymphatic: No pathologically enlarged lymph nodes. No abdominal aortic aneurysm. Chronic splenic vein thrombosis is seen with numerous portosystemic collaterals in the upper abdomen mainly in the gastrosplenic ligament. Reproductive:  No mass or other significant abnormality. Other:  None. Musculoskeletal:  No suspicious bone lesions identified. IMPRESSION: Mild acute pancreatitis. 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 Electronically Signed   By: Danae Orleans M.D.   On: 12/08/2019 14:02    Scheduled Meds: . famotidine  20 mg Oral BID  . nicotine  21 mg Transdermal Daily  . sodium chloride flush  3 mL Intravenous Once   Continuous Infusions:    LOS: 1 day   Time spent: 28 minutes   Hughie Closs, MD [facility]  12/09/2019, 12:55 PM   To contact the attending provider between 7A-7P or the covering provider during after hours 7P-7A, please log into the web site [email or web address].

## 2019-12-09 NOTE — Progress Notes (Signed)
Pt states he took his oxycodone and then vomited it back up.. pt did not save vomitous so this RN could see meds.. informed pt he would have to wait until 530 for next pain medication administration.

## 2019-12-10 NOTE — Discharge Summary (Signed)
Physician Discharge Summary  Austin Jenkins JAS:[id number] DOB: 06-09-1986 DOA: 12/07/2019  PCP: [facility]  Admit date: 12/07/2019 Discharge date: 12/10/2019  Admitted From: Home Disposition: Home  Recommendations for Outpatient Follow-up:  1. Follow up with PCP in 1-2 weeks 2. Please obtain BMP/CBC in one week 3. Please follow up on the following pending results:  Home Health: None Equipment/Devices: None  Discharge Condition: Stable CODE STATUS: Full code Diet recommendation: Soft and then transition to regular as tolerated  Subjective: Patient seen and examined this morning.  Felt much better.  Pain down to 4 out of 10.  Tolerated full liquid diet.  No nausea or vomiting.  Wanted to advance diet.  Tolerated soft diet and wants to go home.  Brief/Interim Summary: Austin Jenkins is a 34 y.o. 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  He was treated conservatively and was kept NPO.  CT abdomen was obtained which also showed acute on chronic pancreatitis.  Subsequently, he was started on clears which was advanced to soft.  He had vomiting x2 yesterday so diet was de-escalate it back to full liquid diet.  He felt much better today.  Wanted to advance his diet.  Tolerated soft diet and wants to go home.  He is being discharged in stable condition.  Discharge Diagnoses:  Principal Problem:   Acute on chronic pancreatitis [facility])    Discharge Instructions  Discharge Instructions    Discharge patient   Complete by: As directed    Discharge disposition: 01-Home or Self Care   Discharge patient date: 12/10/2019     Allergies as of 12/10/2019      Reactions   Other Anaphylaxis   mayonnaise      Medication List    TAKE these medications   ALPRAZolam 1  MG tablet Commonly known as: XANAX Take 1 mg by mouth 2 (two) times daily as needed for anxiety.   buPROPion 100 MG 12 hr tablet Commonly known as: WELLBUTRIN SR Take 100 mg by mouth daily.   dicyclomine 20 MG tablet Commonly known as: BENTYL Take 1 tablet (20 mg total) by mouth 2 (two) times daily as needed for spasms.   lidocaine 2 % solution Commonly known as: XYLOCAINE Use as directed 15 mLs in the mouth or throat every 6 (six) hours as needed for mouth pain.   omeprazole 20 MG tablet Commonly known as: PRILOSEC OTC Take 20 mg by mouth daily.   ondansetron 4 MG disintegrating tablet Commonly known as: Zofran ODT Take 1 tablet (4 mg total) by mouth every 8 (eight) hours as needed for nausea or vomiting.   ondansetron 4 MG tablet Commonly known as: ZOFRAN Take 1 tablet (4 mg total) by mouth every 6 (six) hours as needed for nausea or vomiting.   Oxycodone HCl 10 MG Tabs Take 10 mg by mouth 4 (four) times daily as needed (pain).   polyethylene glycol powder 17 GM/SCOOP powder Commonly known as: MiraLax Start taking 1 capful 3 times a day. Slowly cut back as needed until you have normal bowel movements. Stay on 1 capful per day while on narcotic pain medication   sucralfate 1 GM/10ML suspension Commonly known as: Carafate Take 10 mLs (1 g total) by mouth 4 (four) times daily -  with meals and at bedtime.      Follow-up  Information    [facility] Follow up in 1 week(s).   Contact information: [address] [place] [address] [phone]          Allergies  Allergen Reactions  . Other Anaphylaxis    mayonnaise    Consultations: None   Procedures/Studies: CT ABDOMEN PELVIS W CONTRAST  Result Date: 12/08/2019 CLINICAL DATA:  Abdominal pain and nausea and vomiting. Recurrent pancreatitis. EXAM: CT ABDOMEN AND PELVIS WITH CONTRAST TECHNIQUE: Multidetector CT imaging of the abdomen and pelvis was performed using the standard protocol  following bolus administration of intravenous contrast. CONTRAST:  OMNIPAQUE IOHEXOL 300 MG/ML  SOLN COMPARISON:  MRI on 10/25/2019 from [facility] FINDINGS: Lower Chest: No acute findings. Hepatobiliary: Probable tiny sub-cm cyst in the left hepatic lobe is too small to characterize but. No suspicious hepatic masses identified. Prior cholecystectomy. No evidence of biliary obstruction. Pancreas: Mild peripancreatic inflammatory changes are seen with reactive thickening of the posterior wall of the stomach, consistent with acute pancreatitis. 1.4 cm cystic area in the pancreatic neck and 3.5 x 1.6 cm cystic lesion in the pancreatic tail show no significant change, and are consistent with small pseudocysts. No new or enlarging pseudocysts identified. Spleen: Within normal limits in size and appearance. Adrenals/Urinary Tract: No masses identified. No evidence of ureteral calculi or hydronephrosis. Stomach/Bowel: Thickening of the posterior wall of the stomach is seen and attributable to acute pancreatitis. No areas of bowel wall thickening or bowel obstruction. Vascular/Lymphatic: No pathologically enlarged lymph nodes. No abdominal aortic aneurysm. Chronic splenic vein thrombosis is seen with numerous portosystemic collaterals in the upper abdomen mainly in the gastrosplenic ligament. Reproductive:  No mass or other significant abnormality. Other:  None. Musculoskeletal:  No suspicious bone lesions identified. IMPRESSION: Mild acute pancreatitis. 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 Electronically Signed   By: Danae Orleans M.D.   On: 12/08/2019 14:02      Discharge Exam: Vitals:   12/10/19 0507 12/10/19 1218  BP: 111/75 109/78  Pulse: (!) 55 78  Resp: 17 16  Temp: 97.8 F (36.6 C) 97.9 F (36.6 C)  SpO2: 97% 98%   Vitals:   12/09/19 2311 12/10/19 0507 12/10/19 0700 12/10/19 1218  BP: 106/72 111/75  109/78  Pulse: (!) 50 (!) 55  78  Resp: 16 17  16    Temp: 98.6 F (37 C) 97.8 F (36.6 C)  97.9 F (36.6 C)  TempSrc: Oral Oral  Oral  SpO2: 100% 97%  98%  Weight:   62.6 kg   Height:   5\' 7"  (1.702 m)     General: Pt is alert, awake, not in acute distress Cardiovascular: RRR, S1/S2 +, no rubs, no gallops Respiratory: CTA bilaterally, no wheezing, no rhonchi Abdominal: Soft, minimal epigastric tenderness, ND, bowel sounds + Extremities: no edema, no cyanosis    The results of significant diagnostics from this hospitalization (including imaging, microbiology, ancillary and laboratory) are listed below for reference.     Microbiology: Recent Results (from the past 240 hour(s))  SARS CORONAVIRUS 2 (TAT 6-24 HRS) Nasopharyngeal Nasopharyngeal Swab     Status: None   Collection Time: 12/07/19  5:39 PM   Specimen: Nasopharyngeal Swab  Result Value Ref Range Status   SARS Coronavirus 2 NEGATIVE NEGATIVE Final    Comment: (NOTE) SARS-CoV-2 target nucleic acids are NOT DETECTED. The SARS-CoV-2 RNA is generally detectable in upper and lower respiratory specimens during the acute phase of infection. Negative results do not preclude  SARS-CoV-2 infection, do not rule out co-infections with other pathogens, and should not be used as the sole basis for treatment or other patient management decisions. Negative results must be combined with clinical observations, patient history, and epidemiological information. The expected result is Negative. Fact Sheet for Patients: SugarRoll.be Fact Sheet for Healthcare Providers: https://www.woods-mathews.com/ This test is not yet approved or cleared by the [place] FDA and  has been authorized for detection and/or diagnosis of SARS-CoV-2 by FDA under an Emergency Use Authorization (EUA). This EUA will remain  in effect (meaning this test can be used) for the duration of the COVID-19 declaration under Section 56 4(b)(1) of the Act, 21 U.S.C. section  360bbb-3(b)(1), unless the authorization is terminated or revoked sooner. Performed at [facility], [place] [address], [place], [place] [address]      Labs: BNP (last 3 results) No results for input(s): BNP in the last 8760 hours. Basic Metabolic Panel: Recent Labs  Lab 12/07/19 1437 12/08/19 0528 12/09/19 0304  NA 138 140 139  K 4.0 3.7 3.7  CL 105 108 106  CO2 24 21* 23  GLUCOSE 182* 101* 89  BUN 8 12 9   CREATININE 0.91 0.90 0.85  CALCIUM 10.0 8.9 8.9   Liver Function Tests: Recent Labs  Lab 12/07/19 1437 12/08/19 0528  AST 18 15  ALT 24 19  ALKPHOS 113 82  BILITOT 1.2 1.1  PROT 7.5 5.9*  ALBUMIN 4.6 3.4*   Recent Labs  Lab 12/07/19 1437  LIPASE 93*   No results for input(s): AMMONIA in the last 168 hours. CBC: Recent Labs  Lab 12/07/19 1437 12/08/19 0528  WBC 8.1 6.5  NEUTROABS  --  4.2  HGB 15.7 12.4*  HCT 47.1 37.6*  MCV 86.9 88.7  PLT 204 128*   Cardiac Enzymes: No results for input(s): CKTOTAL, CKMB, CKMBINDEX, TROPONINI in the last 168 hours. BNP: Invalid input(s): POCBNP CBG: No results for input(s): GLUCAP in the last 168 hours. D-Dimer No results for input(s): DDIMER in the last 72 hours. Hgb A1c No results for input(s): HGBA1C in the last 72 hours. Lipid Profile No results for input(s): CHOL, HDL, LDLCALC, TRIG, CHOLHDL, LDLDIRECT in the last 72 hours. Thyroid function studies No results for input(s): TSH, T4TOTAL, T3FREE, THYROIDAB in the last 72 hours.  Invalid input(s): FREET3 Anemia work up No results for input(s): VITAMINB12, FOLATE, FERRITIN, TIBC, IRON, RETICCTPCT in the last 72 hours. Urinalysis    Component Value Date/Time   COLORURINE YELLOW 12/07/2019 1650   APPEARANCEUR HAZY (A) 12/07/2019 1650   LABSPEC 1.020 12/07/2019 1650   PHURINE 5.0 12/07/2019 1650   GLUCOSEU NEGATIVE 12/07/2019 1650   HGBUR NEGATIVE 12/07/2019 1650   BILIRUBINUR NEGATIVE 12/07/2019 1650   KETONESUR 20 (A) 12/07/2019 1650    PROTEINUR 30 (A) 12/07/2019 1650   NITRITE NEGATIVE 12/07/2019 1650   LEUKOCYTESUR NEGATIVE 12/07/2019 1650   Sepsis Labs Invalid input(s): PROCALCITONIN,  WBC,  LACTICIDVEN Microbiology Recent Results (from the past 240 hour(s))  SARS CORONAVIRUS 2 (TAT 6-24 HRS) Nasopharyngeal Nasopharyngeal Swab     Status: None   Collection Time: 12/07/19  5:39 PM   Specimen: Nasopharyngeal Swab  Result Value Ref Range Status   SARS Coronavirus 2 NEGATIVE NEGATIVE Final    Comment: (NOTE) SARS-CoV-2 target nucleic acids are NOT DETECTED. The SARS-CoV-2 RNA is generally detectable in upper and lower respiratory specimens during the acute phase of infection. Negative results do not preclude SARS-CoV-2 infection, do not rule out co-infections with  other pathogens, and should not be used as the sole basis for treatment or other patient management decisions. Negative results must be combined with clinical observations, patient history, and epidemiological information. The expected result is Negative. Fact Sheet for Patients: HairSlick.no Fact Sheet for Healthcare Providers: quierodirigir.com This test is not yet approved or cleared by the [place] FDA and  has been authorized for detection and/or diagnosis of SARS-CoV-2 by FDA under an Emergency Use Authorization (EUA). This EUA will remain  in effect (meaning this test can be used) for the duration of the COVID-19 declaration under Section 56 4(b)(1) of the Act, 21 U.S.C. section 360bbb-3(b)(1), unless the authorization is terminated or revoked sooner. Performed at [facility], [address], [place], [place] [address]      Time coordinating discharge: Over 30 minutes  SIGNED:   Hughie Closs, MD  [facility] 12/10/2019, 1:06 PM  If 7PM-7AM, please contact night-coverage [email or web address]

## 2019-12-10 NOTE — Progress Notes (Signed)
Patient given all discharge instructions and notified to follow up with PCP. All belongings with patient. IV removed.

## 2019-12-10 NOTE — Discharge Instructions (Signed)
Acute Pancreatitis    Acute pancreatitis happens when the pancreas gets swollen. The pancreas is a large gland in the body that helps to control blood sugar. It also makes enzymes that help to digest food.  This condition can last a few days and cause serious problems. The lungs, heart, and kidneys may stop working.  What are the causes?  Causes include:  · Alcohol abuse.  · Drug abuse.  · Gallstones.  · A tumor in the pancreas.  Other causes include:  · Some medicines.  · Some chemicals.  · Diabetes.  · An infection.  · Damage caused by an accident.  · The poison (venom) from a scorpion bite.  · Belly (abdominal) surgery.  · The body's defense system (immune system) attacking the pancreas (autoimmune pancreatitis).  · Genes that are passed from parent to child (inherited).  In some cases, the cause is not known.  What are the signs or symptoms?  · Pain in the upper belly that may be felt in the back. The pain may be very bad.  · Swelling of the belly.  · Feeling sick to your stomach (nauseous) and throwing up (vomiting).  · Fever.  How is this treated?  You will likely have to stay in the hospital. Treatment may include:  · Pain medicine.  · Fluid through an IV tube.  · Placing a tube in the stomach to take out the stomach contents. This may help you stop throwing up.  · Not eating for 3-4 days.  · Antibiotic medicines, if you have an infection.  · Treating any other problems that may be the cause.  · Steroid medicines, if your problem is caused by your defense system attacking your body's own tissues.  · Surgery.  Follow these instructions at home:  Eating and drinking    · Follow instructions from your doctor about what to eat and drink.  · Eat foods that do not have a lot of fat in them.  · Eat small meals often. Do not eat big meals.  · Drink enough fluid to keep your pee (urine) pale yellow.  · Do not drink alcohol if it caused your condition.  Medicines  · Take over-the-counter and prescription medicines only  as told by your doctor.  · Ask your doctor if the medicine prescribed to you:  ? Requires you to avoid driving or using heavy machinery.  ? Can cause trouble pooping (constipation). You may need to take steps to prevent or treat trouble pooping:  § Take over-the-counter or prescription medicines.  § Eat foods that are high in fiber. These include beans, whole grains, and fresh fruits and vegetables.  § Limit foods that are high in fat and sugar. These include fried or sweet foods.  General instructions  · Do not use any products that contain nicotine or tobacco, such as cigarettes, e-cigarettes, and chewing tobacco. If you need help quitting, ask your doctor.  · Get plenty of rest.  · Check your blood sugar at home as told by your doctor.  · Keep all follow-up visits as told by your doctor. This is important.  Contact a doctor if:  · You do not get better as quickly as expected.  · You have new symptoms.  · Your symptoms get worse.  · You have pain or weakness that lasts a long time.  · You keep feeling sick to your stomach.  · You get better and then you have pain again.  ·   You have a fever.  Get help right away if:  · You cannot eat or keep fluids down.  · Your pain gets very bad.  · Your skin or the white part of your eyes turns yellow.  · You have sudden swelling in your belly.  · You throw up.  · You feel dizzy or you pass out (faint).  · Your blood sugar is high (over 300 mg/dL).  Summary  · Acute pancreatitis happens when the pancreas gets swollen.  · This condition is often caused by alcohol abuse, drug abuse, or gallstones.  · You will likely have to stay in the hospital for treatment.  This information is not intended to replace advice given to you by your health care provider. Make sure you discuss any questions you have with your health care provider.  Document Revised: 07/09/2018 Document Reviewed: 07/09/2018  Elsevier Patient Education © 2020 Elsevier Inc.

## 2019-12-10 NOTE — Plan of Care (Signed)

## 2019-12-19 ENCOUNTER — Ambulatory Visit: Payer: Medicaid Other | Attending: Internal Medicine

## 2019-12-19 DIAGNOSIS — Z23 Encounter for immunization: Secondary | ICD-10-CM

## 2019-12-19 NOTE — Progress Notes (Signed)
   Covid-19 Vaccination Clinic  Name:  Austin Jenkins    MRN: [id number] DOB: 08/08/1986  12/19/2019  [name] was observed post Covid-19 immunization for 15 minutes without incident. He was provided with Vaccine Information Sheet and instruction to access the V-Safe system.   [name] was instructed to call 911 with any severe reactions post vaccine: Marland Kitchen Difficulty breathing  . Swelling of face and throat  . A fast heartbeat  . A bad rash all over body  . Dizziness and weakness   Immunizations Administered    Name Date Dose VIS Date Route   Pfizer COVID-19 Vaccine 12/19/2019  3:35 PM 0.3 mL 09/13/2019 Intramuscular   Manufacturer: [employer], [employer]   Lot: [id number]   NDC: [address]-9

## 2019-12-21 ENCOUNTER — Encounter (HOSPITAL_COMMUNITY): Payer: Self-pay | Admitting: Emergency Medicine

## 2019-12-21 ENCOUNTER — Emergency Department (HOSPITAL_COMMUNITY)
Admission: EM | Admit: 2019-12-21 | Discharge: 2019-12-21 | Disposition: A | Discharge status: Home / Self Care | Payer: Medicaid Other | Attending: Emergency Medicine | Admitting: Emergency Medicine

## 2019-12-21 ENCOUNTER — Other Ambulatory Visit: Payer: Self-pay

## 2019-12-21 DIAGNOSIS — R1013 Epigastric pain: Secondary | ICD-10-CM | POA: Insufficient documentation

## 2019-12-21 DIAGNOSIS — F17210 Nicotine dependence, cigarettes, uncomplicated: Secondary | ICD-10-CM | POA: Insufficient documentation

## 2019-12-21 DIAGNOSIS — Z79899 Other long term (current) drug therapy: Secondary | ICD-10-CM | POA: Insufficient documentation

## 2019-12-21 LAB — CBC
HCT: 46.1 % (ref 39.0–52.0)
Hemoglobin: 15.2 g/dL (ref 13.0–17.0)
MCH: 29.1 pg (ref 26.0–34.0)
MCHC: 33 g/dL (ref 30.0–36.0)
MCV: 88.1 fL (ref 80.0–100.0)
Platelets: 174 10*3/uL (ref 150–400)
RBC: 5.23 MIL/uL (ref 4.22–5.81)
RDW: 13.1 % (ref 11.5–15.5)
WBC: 7.8 10*3/uL (ref 4.0–10.5)
nRBC: 0 % (ref 0.0–0.2)

## 2019-12-21 LAB — COMPREHENSIVE METABOLIC PANEL
ALT: 25 U/L (ref 0–44)
AST: 21 U/L (ref 15–41)
Albumin: 4.5 g/dL (ref 3.5–5.0)
Alkaline Phosphatase: 108 U/L (ref 38–126)
Anion gap: 9 (ref 5–15)
BUN: <5 mg/dL — ABNORMAL LOW (ref 6–20)
CO2: 25 mmol/L (ref 22–32)
Calcium: 9.7 mg/dL (ref 8.9–10.3)
Chloride: 105 mmol/L (ref 98–111)
Creatinine, Ser: 0.93 mg/dL (ref 0.61–1.24)
GFR calc Af Amer: >60 mL/min (ref >60)
GFR calc non Af Amer: >60 mL/min (ref >60)
Glucose, Bld: 172 mg/dL — ABNORMAL HIGH (ref 70–99)
Potassium: 4 mmol/L (ref 3.5–5.1)
Sodium: 139 mmol/L (ref 135–145)
Total Bilirubin: 0.7 mg/dL (ref 0.3–1.2)
Total Protein: 7.3 g/dL (ref 6.5–8.1)

## 2019-12-21 LAB — URINALYSIS, ROUTINE W REFLEX MICROSCOPIC
Bilirubin Urine: NEGATIVE
Glucose, UA: NEGATIVE mg/dL
Hgb urine dipstick: NEGATIVE
Ketones, ur: NEGATIVE mg/dL
Leukocytes,Ua: NEGATIVE
Nitrite: NEGATIVE
Protein, ur: NEGATIVE mg/dL
Specific Gravity, Urine: 1.009 (ref 1.005–1.030)
pH: 6 (ref 5.0–8.0)

## 2019-12-21 LAB — LIPASE, BLOOD: Lipase: 15 U/L (ref 11–51)

## 2019-12-21 MED ORDER — HYDROMORPHONE HCL 1 MG/ML IJ SOLN
1.0000 mg | Freq: Once | INTRAMUSCULAR | Status: AC
  Administered 2019-12-21: 1 mg via INTRAVENOUS
  Filled 2019-12-21: qty 1

## 2019-12-21 MED ORDER — OXYCODONE HCL 5 MG PO TABS
5.0000 mg | ORAL_TABLET | Freq: Once | ORAL | Status: AC
  Administered 2019-12-21: 5 mg via ORAL
  Filled 2019-12-21: qty 1

## 2019-12-21 MED ORDER — PANTOPRAZOLE SODIUM 40 MG IV SOLR
40.0000 mg | Freq: Once | INTRAVENOUS | Status: AC
  Administered 2019-12-21: 40 mg via INTRAVENOUS
  Filled 2019-12-21: qty 40

## 2019-12-21 MED ORDER — SODIUM CHLORIDE 0.9 % IV BOLUS
1000.0000 mL | Freq: Once | INTRAVENOUS | Status: AC
  Administered 2019-12-21: 1000 mL via INTRAVENOUS

## 2019-12-21 MED ORDER — SODIUM CHLORIDE 0.9% FLUSH
3.0000 mL | Freq: Once | INTRAVENOUS | Status: AC
  Administered 2019-12-21: 3 mL via INTRAVENOUS

## 2019-12-21 MED ORDER — ONDANSETRON HCL 4 MG/2ML IJ SOLN
4.0000 mg | Freq: Once | INTRAMUSCULAR | Status: AC
  Administered 2019-12-21: 4 mg via INTRAVENOUS
  Filled 2019-12-21: qty 2

## 2019-12-21 NOTE — ED Triage Notes (Signed)
Patient reports mid back pain /low back pain for 2 weeks with emesis , denies diarrhea , no fever or chills .

## 2019-12-21 NOTE — ED Provider Notes (Signed)
[facility] EMERGENCY DEPARTMENT Provider Note   CSN: [id number] Arrival date & time: 12/21/19  1905     History Chief Complaint  Patient presents with  . Abdominal Pain    Austin Jenkins is a 34 y.o. male.  34 year old male with past medical history of pancreatitis presents with complaint of worsening epigastric pain and vomiting.  Pain is worse with movement and eating.  Radiates to his back.  Patient was recently discharged from the hospital on March 9 for acute on chronic pancreatitis.  Patient states that he continued to have pain after his discharge, patient reports worsening pain over the past few days, vomiting for the past 2 days.  Patient tried taking a Zofran ODT today however states the taste of the medicine caused him to throw up, unable to tolerate his oxycodone p.o.  Denies fevers, changes in bowel or bladder habits.  Pain is similar to prior episodes.  Patient reports not having alcohol to drink for the past year (May 2020).  No other complaints or concerns today.        Past Medical History:  Diagnosis Date  . Alcohol abuse   . Anxiety   . Depression   . IBS (irritable bowel syndrome)   . Pancreatitis     Patient Active Problem List   Diagnosis Date Noted  . Anxiety   . Epigastric pain   . Acute on chronic pancreatitis ([place]) 08/09/2019  . Acute alcoholic pancreatitis 38/18/2993    Past Surgical History:  Procedure Laterality Date  . CHOLECYSTECTOMY         No family history on file.  Social History   Tobacco Use  . Smoking status: Current Every Day Smoker    Packs/day: 0.50  . Smokeless tobacco: Current User  Substance Use Topics  . Alcohol use: Not Currently    Alcohol/week: 42.0 standard drinks    Types: 42 Cans of beer per week    Comment: pt states he has not had a drinik since april  . Drug use: Not on file    Home Medications Prior to Admission medications   Medication Sig Start Date End Date Taking?  Authorizing Provider  acetaminophen (TYLENOL) 500 MG tablet Take 1,000 mg by mouth every 6 (six) hours as needed (pain).   Yes [provider]  ALPRAZolam Duanne Moron) 1 MG tablet Take 1 mg by mouth 2 (two) times daily as needed for anxiety.   Yes [provider]  buPROPion (WELLBUTRIN SR) 100 MG 12 hr tablet Take 100 mg by mouth daily. 03/28/19  Yes [provider]  naloxone (NARCAN) 4 MG/0.1ML LIQD nasal spray kit Place 1 spray into the nose once as needed (opioid overdose).   Yes [provider]  ondansetron (ZOFRAN ODT) 4 MG disintegrating tablet Take 1 tablet (4 mg total) by mouth every 8 (eight) hours as needed for nausea or vomiting. 09/05/19  Yes Caccavale, Sophia, PA-C  Oxycodone HCl 10 MG TABS Take 10 mg by mouth 4 (four) times daily as needed (pain).   Yes [provider]  pantoprazole (PROTONIX) 40 MG tablet Take 40 mg by mouth daily. 09/17/19  Yes [provider]  polyethylene glycol powder (MIRALAX) 17 GM/SCOOP powder Start taking 1 capful 3 times a day. Slowly cut back as needed until you have normal bowel movements. Stay on 1 capful per day while on narcotic pain medication Patient taking differently: Take 17 g by mouth daily as needed (constipation).  09/22/19  Yes Card[employer], [employer]  Johnsie Cancel, MD  dicyclomine (BENTYL) 20 MG tablet Take 1 tablet (20 mg total) by mouth 2 (two) times daily as needed for spasms. Patient not taking: Reported on 12/07/2019 09/05/19   Caccavale, Sophia, PA-C  ondansetron (ZOFRAN) 4 MG tablet Take 1 tablet (4 mg total) by mouth every 6 (six) hours as needed for nausea or vomiting. Patient not taking: Reported on 12/21/2019 04/03/19   Orpah Greek, MD  sucralfate (CARAFATE) 1 GM/10ML suspension Take 10 mLs (1 g total) by mouth 4 (four) times daily -  with meals and at bedtime. Patient not taking: Reported on 12/07/2019 11/10/19   Caccavale, Sophia, PA-C    Allergies    Other  Review of Systems   Review of  Systems  Constitutional: Negative for chills and fever.  Respiratory: Negative for shortness of breath.   Cardiovascular: Negative for chest pain.  Gastrointestinal: Positive for abdominal pain, nausea and vomiting. Negative for constipation and diarrhea.  Genitourinary: Negative for difficulty urinating and dysuria.  Musculoskeletal: Positive for back pain.  Skin: Negative for rash and wound.  Allergic/Immunologic: Negative for immunocompromised state.  Neurological: Negative for weakness.  Hematological: Negative for adenopathy.  All other systems reviewed and are negative.   Physical Exam Updated Vital Signs BP 123/77   Pulse 86   Temp 98.6 F (37 C) (Oral)   Resp 12   Ht 5' 10"  (1.778 m)   Wt 75 kg   SpO2 100%   BMI 23.72 kg/m   Physical Exam Vitals and nursing note reviewed.  Constitutional:      General: He is not in acute distress.    Appearance: He is well-developed. He is not diaphoretic.  HENT:     Head: Normocephalic and atraumatic.  Cardiovascular:     Rate and Rhythm: Normal rate and regular rhythm.     Heart sounds: Normal heart sounds.  Pulmonary:     Effort: Pulmonary effort is normal.     Breath sounds: Normal breath sounds.  Abdominal:     General: Abdomen is flat.     Palpations: Abdomen is soft.     Tenderness: There is generalized abdominal tenderness and tenderness in the epigastric area and left upper quadrant.  Skin:    General: Skin is warm and dry.     Findings: No erythema or rash.  Neurological:     Mental Status: He is alert and oriented to person, place, and time.  Psychiatric:        Behavior: Behavior normal.     ED Results / Procedures / Treatments   Labs (all labs ordered are listed, but only abnormal results are displayed) Labs Reviewed  COMPREHENSIVE METABOLIC PANEL - Abnormal; Notable for the following components:      Result Value   Glucose, Bld 172 (*)    BUN <5 (*)    All other components within normal limits   LIPASE, BLOOD  CBC  URINALYSIS, ROUTINE W REFLEX MICROSCOPIC    EKG None  Radiology No results found.  Procedures Procedures (including critical care time)  Medications Ordered in ED Medications  sodium chloride flush (NS) 0.9 % injection 3 mL (3 mLs Intravenous Given 12/21/19 2004)  ondansetron (ZOFRAN) injection 4 mg (4 mg Intravenous Given 12/21/19 2023)  HYDROmorphone (DILAUDID) injection 1 mg (1 mg Intravenous Given 12/21/19 2023)  sodium chloride 0.9 % bolus 1,000 mL (0 mLs Intravenous Stopped 12/21/19 2124)  pantoprazole (PROTONIX) injection 40 mg (40 mg Intravenous Given 12/21/19 2208)  oxyCODONE (Oxy IR/ROXICODONE) immediate  release tablet 5 mg (5 mg Oral Given 12/21/19 2208)    ED Course  I have reviewed the triage vital signs and the nursing notes.  Pertinent labs & imaging results that were available during my care of the patient were reviewed by me and considered in my medical decision making (see chart for details).  Clinical Course as of Dec 21 2227  Sat Dec 21, 5258  [birth year] 34year old male, epigastric pain similar to prior episodes of pancreatitis. Unable to tolerate zofran odt, unable to keep chronic pain meds down. On exam, epigastric tenderness.  Recent admission reviewed, labs from today reviewed and are reassuring with lipase of 15. Patient is able to tolerate Pos, reports return of pain after eating but no longer vomiting. Plan is to give dose of PO oxy, IV protonix, dc to follow up with PCP.    [LM]    Clinical Course User Index [LM] Roque Lias   MDM Rules/Calculators/A&P                      Final Clinical Impression(s) / ED Diagnoses Final diagnoses:  Epigastric pain    Rx / DC Orders ED Discharge Orders    None       Roque Lias 12/21/19 2229    Carmin Muskrat, MD 12/23/19 [id number]

## 2019-12-21 NOTE — ED Notes (Signed)
Patient verbalizes understanding of discharge instructions. Opportunity for questioning and answers were provided. Armband removed by staff, pt discharged from ED. Pt. ambulatory and discharged home.  

## 2019-12-21 NOTE — ED Notes (Signed)
Pt states "I can not eat because it makes the pain worse". Pt tolerated half a cracker and a few sips of ginger ale.

## 2019-12-22 ENCOUNTER — Other Ambulatory Visit: Payer: Self-pay

## 2019-12-22 ENCOUNTER — Emergency Department (HOSPITAL_COMMUNITY)
Admission: EM | Admit: 2019-12-22 | Discharge: 2019-12-22 | Disposition: A | Discharge status: Home / Self Care | Payer: Medicaid Other | Attending: Emergency Medicine | Admitting: Emergency Medicine

## 2019-12-22 ENCOUNTER — Emergency Department (HOSPITAL_COMMUNITY): Payer: Medicaid Other

## 2019-12-22 ENCOUNTER — Encounter (HOSPITAL_COMMUNITY): Payer: Self-pay | Admitting: Emergency Medicine

## 2019-12-22 DIAGNOSIS — F17210 Nicotine dependence, cigarettes, uncomplicated: Secondary | ICD-10-CM | POA: Insufficient documentation

## 2019-12-22 DIAGNOSIS — R1013 Epigastric pain: Secondary | ICD-10-CM | POA: Diagnosis present

## 2019-12-22 DIAGNOSIS — F1010 Alcohol abuse, uncomplicated: Secondary | ICD-10-CM | POA: Diagnosis not present

## 2019-12-22 DIAGNOSIS — R112 Nausea with vomiting, unspecified: Secondary | ICD-10-CM | POA: Insufficient documentation

## 2019-12-22 DIAGNOSIS — K860 Alcohol-induced chronic pancreatitis: Secondary | ICD-10-CM | POA: Diagnosis not present

## 2019-12-22 LAB — COMPREHENSIVE METABOLIC PANEL
ALT: 25 U/L (ref 0–44)
AST: 21 U/L (ref 15–41)
Albumin: 4.2 g/dL (ref 3.5–5.0)
Alkaline Phosphatase: 103 U/L (ref 38–126)
Anion gap: 12 (ref 5–15)
BUN: 7 mg/dL (ref 6–20)
CO2: 24 mmol/L (ref 22–32)
Calcium: 9.4 mg/dL (ref 8.9–10.3)
Chloride: 103 mmol/L (ref 98–111)
Creatinine, Ser: 0.83 mg/dL (ref 0.61–1.24)
GFR calc Af Amer: >60 mL/min (ref >60)
GFR calc non Af Amer: >60 mL/min (ref >60)
Glucose, Bld: 108 mg/dL — ABNORMAL HIGH (ref 70–99)
Potassium: 3.7 mmol/L (ref 3.5–5.1)
Sodium: 139 mmol/L (ref 135–145)
Total Bilirubin: 0.8 mg/dL (ref 0.3–1.2)
Total Protein: 6.8 g/dL (ref 6.5–8.1)

## 2019-12-22 LAB — URINALYSIS, ROUTINE W REFLEX MICROSCOPIC
Bilirubin Urine: NEGATIVE
Glucose, UA: NEGATIVE mg/dL
Hgb urine dipstick: NEGATIVE
Ketones, ur: 20 mg/dL — AB
Leukocytes,Ua: NEGATIVE
Nitrite: NEGATIVE
Protein, ur: NEGATIVE mg/dL
Specific Gravity, Urine: 1.014 (ref 1.005–1.030)
pH: 6 (ref 5.0–8.0)

## 2019-12-22 LAB — CBC
HCT: 44 % (ref 39.0–52.0)
Hemoglobin: 14.3 g/dL (ref 13.0–17.0)
MCH: 29.1 pg (ref 26.0–34.0)
MCHC: 32.5 g/dL (ref 30.0–36.0)
MCV: 89.4 fL (ref 80.0–100.0)
Platelets: 154 10*3/uL (ref 150–400)
RBC: 4.92 MIL/uL (ref 4.22–5.81)
RDW: 13 % (ref 11.5–15.5)
WBC: 8.8 10*3/uL (ref 4.0–10.5)
nRBC: 0 % (ref 0.0–0.2)

## 2019-12-22 LAB — LIPASE, BLOOD: Lipase: 16 U/L (ref 11–51)

## 2019-12-22 MED ORDER — ONDANSETRON HCL 4 MG/2ML IJ SOLN
4.0000 mg | Freq: Once | INTRAMUSCULAR | Status: AC
  Administered 2019-12-22: 4 mg via INTRAVENOUS
  Filled 2019-12-22: qty 2

## 2019-12-22 MED ORDER — MORPHINE SULFATE (PF) 4 MG/ML IV SOLN
4.0000 mg | Freq: Once | INTRAVENOUS | Status: AC
  Administered 2019-12-22: 4 mg via INTRAVENOUS
  Filled 2019-12-22: qty 1

## 2019-12-22 MED ORDER — HYDROMORPHONE HCL 1 MG/ML IJ SOLN
1.0000 mg | Freq: Once | INTRAMUSCULAR | Status: AC
  Administered 2019-12-22: 1 mg via INTRAVENOUS
  Filled 2019-12-22: qty 1

## 2019-12-22 MED ORDER — IOHEXOL 300 MG/ML  SOLN
100.0000 mL | Freq: Once | INTRAMUSCULAR | Status: AC | PRN
  Administered 2019-12-22: 100 mL via INTRAVENOUS

## 2019-12-22 MED ORDER — SODIUM CHLORIDE 0.9 % IV BOLUS
1000.0000 mL | Freq: Once | INTRAVENOUS | Status: AC
  Administered 2019-12-22: 1000 mL via INTRAVENOUS

## 2019-12-22 MED ORDER — SODIUM CHLORIDE 0.9% FLUSH: 3.0000 mL | Freq: Once | INTRAVENOUS | Status: DC

## 2019-12-22 NOTE — Discharge Instructions (Addendum)
Please return for any problem.  Follow-up with your regular care provider tomorrow.  Talk with your regular care provider tomorrow about your chronic pain management plan.

## 2019-12-22 NOTE — ED Provider Notes (Signed)
[facility] [facility] EMERGENCY DEPARTMENT Provider Note   CSN: [id number] Arrival date & time: 12/22/19  1634     History Chief Complaint  Patient presents with  . Abdominal Pain    Austin Jenkins is a 34 y.o. male.  34 year old male with prior medical history as detailed below presents for evaluation of abdominal pain.  Patient reports onset history of pancreatitis.  Patient reports that he was evaluated yesterday for similar complaint.  He complains of worsening epigastric abdominal pain associated with nausea and vomiting.  He denies fever.  He reports that his pain is consistent with prior episodes of pancreatitis.  He denies any recent alcohol use.  He reports that home use of Zofran is ineffective at controlling his nausea.  He has been unable to take any of his pain medications p.o.  The history is provided by the patient and medical records.  Abdominal Pain Pain location:  Epigastric Pain quality: aching and cramping   Pain radiates to:  Does not radiate Pain severity:  Moderate Onset quality:  Gradual Duration:  3 days Timing:  Constant Progression:  Worsening Chronicity:  Recurrent Relieved by:  Nothing Worsened by:  Nothing      Past Medical History:  Diagnosis Date  . Alcohol abuse   . Anxiety   . Depression   . IBS (irritable bowel syndrome)   . Pancreatitis     Patient Active Problem List   Diagnosis Date Noted  . Anxiety   . Epigastric pain   . Acute on chronic pancreatitis ([place]) 08/09/2019  . Acute alcoholic pancreatitis 67/67/2094    Past Surgical History:  Procedure Laterality Date  . CHOLECYSTECTOMY         No family history on file.  Social History   Tobacco Use  . Smoking status: Current Every Day Smoker    Packs/day: 0.50  . Smokeless tobacco: Current User  Substance Use Topics  . Alcohol use: Not Currently    Alcohol/week: 42.0 standard drinks    Types: 42 Cans of beer per week    Comment: pt states he has not had  a drinik since april  . Drug use: Not on file    Home Medications Prior to Admission medications   Medication Sig Start Date End Date Taking? Authorizing Provider  acetaminophen (TYLENOL) 500 MG tablet Take 1,000 mg by mouth every 6 (six) hours as needed (pain).    [provider]  ALPRAZolam Duanne Moron) 1 MG tablet Take 1 mg by mouth 2 (two) times daily as needed for anxiety.    [provider]  buPROPion (WELLBUTRIN SR) 100 MG 12 hr tablet Take 100 mg by mouth daily. 03/28/19   [provider]  dicyclomine (BENTYL) 20 MG tablet Take 1 tablet (20 mg total) by mouth 2 (two) times daily as needed for spasms. Patient not taking: Reported on 12/07/2019 09/05/19   Caccavale, Sophia, PA-C  naloxone [facility]) 4 MG/0.1ML LIQD nasal spray kit Place 1 spray into the nose once as needed (opioid overdose).    [provider]  ondansetron (ZOFRAN ODT) 4 MG disintegrating tablet Take 1 tablet (4 mg total) by mouth every 8 (eight) hours as needed for nausea or vomiting. 09/05/19   Caccavale, Sophia, PA-C  ondansetron (ZOFRAN) 4 MG tablet Take 1 tablet (4 mg total) by mouth every 6 (six) hours as needed for nausea or vomiting. Patient not taking: Reported on 12/21/2019 04/03/19   Orpah Greek, MD  Oxycodone HCl 10 MG TABS Take  10 mg by mouth 4 (four) times daily as needed (pain).    [provider]  pantoprazole (PROTONIX) 40 MG tablet Take 40 mg by mouth daily. 09/17/19   [provider]  polyethylene glycol powder (MIRALAX) 17 GM/SCOOP powder Start taking 1 capful 3 times a day. Slowly cut back as needed until you have normal bowel movements. Stay on 1 capful per day while on narcotic pain medication Patient taking differently: Take 17 g by mouth daily as needed (constipation).  09/22/19   Fatima Blank, MD  sucralfate (CARAFATE) 1 GM/10ML suspension Take 10 mLs (1 g total) by mouth 4 (four) times daily -  with meals and at bedtime. Patient not  taking: Reported on 12/07/2019 11/10/19   Caccavale, Sophia, PA-C    Allergies    Other  Review of Systems   Review of Systems  Gastrointestinal: Positive for abdominal pain.  All other systems reviewed and are negative.   Physical Exam Updated Vital Signs BP 117/84 (BP Location: Right Arm)   Pulse 79   Temp 98.1 F (36.7 C) (Oral)   Resp 16   SpO2 100%   Physical Exam Vitals and nursing note reviewed.  Constitutional:      General: He is not in acute distress.    Appearance: He is well-developed.  HENT:     Head: Normocephalic and atraumatic.  Eyes:     Conjunctiva/sclera: Conjunctivae normal.     Pupils: Pupils are equal, round, and reactive to light.  Cardiovascular:     Rate and Rhythm: Normal rate and regular rhythm.     Heart sounds: Normal heart sounds.  Pulmonary:     Effort: Pulmonary effort is normal. No respiratory distress.     Breath sounds: Normal breath sounds.  Abdominal:     General: There is no distension.     Palpations: Abdomen is soft.     Tenderness: There is abdominal tenderness in the epigastric area.  Musculoskeletal:        General: No deformity. Normal range of motion.     Cervical back: Normal range of motion and neck supple.  Skin:    General: Skin is warm and dry.  Neurological:     Mental Status: He is alert and oriented to person, place, and time.     ED Results / Procedures / Treatments   Labs (all labs ordered are listed, but only abnormal results are displayed) Labs Reviewed  COMPREHENSIVE METABOLIC PANEL - Abnormal; Notable for the following components:      Result Value   Glucose, Bld 108 (*)    All other components within normal limits  URINALYSIS, ROUTINE W REFLEX MICROSCOPIC - Abnormal; Notable for the following components:   Ketones, ur 20 (*)    All other components within normal limits  LIPASE, BLOOD  CBC    EKG None  Radiology CT ABDOMEN PELVIS W CONTRAST  Result Date: 12/22/2019 CLINICAL DATA:  Abdominal  pain.  History of pancreatitis EXAM: CT ABDOMEN AND PELVIS WITH CONTRAST TECHNIQUE: Multidetector CT imaging of the abdomen and pelvis was performed using the standard protocol following bolus administration of intravenous contrast. CONTRAST:  176m OMNIPAQUE IOHEXOL 300 MG/ML  SOLN COMPARISON:  12/08/2019 FINDINGS: Lower chest: Left base atelectasis or scarring. No acute abnormality. Hepatobiliary: No focal liver abnormality is seen. Status post cholecystectomy. No biliary dilatation. Pancreas: Previously seen fluid collections in the pancreatic neck and tail have decreased in size and difficult to visualize currently. Decreasing inflammation noted around the  pancreas. Spleen: No focal abnormality.  Normal size. Adrenals/Urinary Tract: No adrenal abnormality. No focal renal abnormality. No stones or hydronephrosis. Urinary bladder is unremarkable. Stomach/Bowel: Normal appendix. Stomach, large and small bowel grossly unremarkable. Vascular/Lymphatic: No evidence of aneurysm or adenopathy. 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 Reproductive: No visible focal abnormality. Other: No free fluid or free air. Musculoskeletal: No acute bony abnormality. IMPRESSION: Decreasing inflammation around the pancreas with decreasing pseudocyst in the pancreatic neck and tail region, now difficult to measure. Again noted is chronic splenic vein thrombosis, unchanged. No acute process in the abdomen or pelvis. Electronically Signed   By: Rolm Baptise M.D.   On: 12/22/2019 19:07    Procedures Procedures (including critical care time)  Medications Ordered in ED Medications  sodium chloride flush (NS) 0.9 % injection 3 mL (has no administration in time range)  sodium chloride 0.9 % bolus 1,000 mL (has no administration in time range)  ondansetron (ZOFRAN) injection 4 mg (has no administration in time range)  morphine 4 MG/ML injection 4 mg (has no administration in time range)    ED Course   I have reviewed the triage vital signs and the nursing notes.  Pertinent labs & imaging results that were available during my care of the patient were reviewed by me and considered in my medical decision making (see chart for details).    MDM Rules/Calculators/A&P                      MDM  Screen complete  Austin Jenkins was evaluated in Emergency Department on 12/22/2019 for the symptoms described in the history of present illness. He was evaluated in the context of the global COVID-19 pandemic, which necessitated consideration that the patient might be at risk for infection with the SARS-CoV-2 virus that causes COVID-19. Institutional protocols and algorithms that pertain to the evaluation of patients at risk for COVID-19 are in a state of rapid change based on information released by regulatory bodies including the CDC and federal and state organizations. These policies and algorithms were followed during the patient's care in the ED.  Patient is presenting for evaluation of reported abdominal pain.  Patient's work-up is reassuring and that there is no clear evidence of acute pathology -specifically worsening pancreatitis.  Patient does report that he is on chronic narcotics at home.  He is concerned that his narcotics will run short prior to his scheduled refill on Tuesday of this week.  He reports that he threw up a couple of his doses over the last 2 or 3 days.  Patient's presentation today is in part related to the patient's running short on his already prescribed chronic narcotics.  Patient does feel improved following his ED evaluation.  He appears to be appropriate for discharge.  Importance of close follow-up is stressed.  Strict return precautions given and understood.  Final Clinical Impression(s) / ED Diagnoses Final diagnoses:  Epigastric pain    Rx / DC Orders ED Discharge Orders    None       Valarie Merino, MD 12/22/19 2007

## 2019-12-22 NOTE — ED Triage Notes (Signed)
Pt c/o abdominal pain, nausea, and vomiting that started last night. Hx pancreatitis.

## 2019-12-23 ENCOUNTER — Emergency Department (HOSPITAL_COMMUNITY)
Admission: EM | Admit: 2019-12-23 | Discharge: 2019-12-24 | Disposition: A | Discharge status: Home / Self Care | Payer: Medicaid Other | Attending: Emergency Medicine | Admitting: Emergency Medicine

## 2019-12-23 DIAGNOSIS — G8929 Other chronic pain: Secondary | ICD-10-CM | POA: Insufficient documentation

## 2019-12-23 DIAGNOSIS — Z79899 Other long term (current) drug therapy: Secondary | ICD-10-CM | POA: Diagnosis not present

## 2019-12-23 DIAGNOSIS — R1013 Epigastric pain: Secondary | ICD-10-CM | POA: Insufficient documentation

## 2019-12-23 DIAGNOSIS — F17200 Nicotine dependence, unspecified, uncomplicated: Secondary | ICD-10-CM | POA: Diagnosis not present

## 2019-12-23 DIAGNOSIS — R112 Nausea with vomiting, unspecified: Secondary | ICD-10-CM | POA: Insufficient documentation

## 2019-12-23 LAB — CBC WITH DIFFERENTIAL/PLATELET
Abs Immature Granulocytes: 0.01 10*3/uL (ref 0.00–0.07)
Basophils Absolute: 0 10*3/uL (ref 0.0–0.1)
Basophils Relative: 1 %
Eosinophils Absolute: 0.2 10*3/uL (ref 0.0–0.5)
Eosinophils Relative: 3 %
HCT: 44.3 % (ref 39.0–52.0)
Hemoglobin: 14.6 g/dL (ref 13.0–17.0)
Immature Granulocytes: 0 %
Lymphocytes Relative: 17 %
Lymphs Abs: 1.2 10*3/uL (ref 0.7–4.0)
MCH: 29.1 pg (ref 26.0–34.0)
MCHC: 33 g/dL (ref 30.0–36.0)
MCV: 88.4 fL (ref 80.0–100.0)
Monocytes Absolute: 0.3 10*3/uL (ref 0.1–1.0)
Monocytes Relative: 4 %
Neutro Abs: 5.2 10*3/uL (ref 1.7–7.7)
Neutrophils Relative %: 75 %
Platelets: 169 10*3/uL (ref 150–400)
RBC: 5.01 MIL/uL (ref 4.22–5.81)
RDW: 13.1 % (ref 11.5–15.5)
WBC: 6.9 10*3/uL (ref 4.0–10.5)
nRBC: 0 % (ref 0.0–0.2)

## 2019-12-23 LAB — COMPREHENSIVE METABOLIC PANEL
ALT: 22 U/L (ref 0–44)
AST: 20 U/L (ref 15–41)
Albumin: 4.3 g/dL (ref 3.5–5.0)
Alkaline Phosphatase: 100 U/L (ref 38–126)
Anion gap: 11 (ref 5–15)
BUN: <5 mg/dL — ABNORMAL LOW (ref 6–20)
CO2: 24 mmol/L (ref 22–32)
Calcium: 9.5 mg/dL (ref 8.9–10.3)
Chloride: 106 mmol/L (ref 98–111)
Creatinine, Ser: 0.87 mg/dL (ref 0.61–1.24)
GFR calc Af Amer: >60 mL/min (ref >60)
GFR calc non Af Amer: >60 mL/min (ref >60)
Glucose, Bld: 159 mg/dL — ABNORMAL HIGH (ref 70–99)
Potassium: 3.9 mmol/L (ref 3.5–5.1)
Sodium: 141 mmol/L (ref 135–145)
Total Bilirubin: 0.5 mg/dL (ref 0.3–1.2)
Total Protein: 6.9 g/dL (ref 6.5–8.1)

## 2019-12-23 LAB — LIPASE, BLOOD: Lipase: 16 U/L (ref 11–51)

## 2019-12-23 NOTE — ED Provider Notes (Signed)
[facility] EMERGENCY DEPARTMENT Provider Note   CSN: [id number] Arrival date & time: 12/23/19  1741   History Chief Complaint: Abdominal pain  Austin Jenkins is a 34 y.o. male.  The history is provided by the patient.  He has history of alcohol abuse, chronic pancreatitis and comes in because of epigastric pain radiating to the back.  This has been present for about the last 3days.  Associated nausea and vomiting.  He has been taking pantoprazole on a regular basis.  He took a dose of ondansetron which gave temporary relief of nausea but states but he started vomiting later in the day.  He rates pain at 10/10.  He does take oxycodone for pain that he had not taken it because of concerns of vomiting.  He denies ethanol consumption in the last year.  He denies fever, chills, sweats.  There has been no constipation or diarrhea.  Past Medical History:  Diagnosis Date  . Alcohol abuse   . Anxiety   . Depression   . IBS (irritable bowel syndrome)   . Pancreatitis     Patient Active Problem List   Diagnosis Date Noted  . Anxiety   . Epigastric pain   . Acute on chronic pancreatitis ([place]) 08/09/2019  . Acute alcoholic pancreatitis 19/41/7408    Past Surgical History:  Procedure Laterality Date  . CHOLECYSTECTOMY         No family history on file.  Social History   Tobacco Use  . Smoking status: Current Every Day Smoker    Packs/day: 0.50  . Smokeless tobacco: Current User  Substance Use Topics  . Alcohol use: Not Currently    Alcohol/week: 42.0 standard drinks    Types: 42 Cans of beer per week    Comment: pt states he has not had a drinik since april  . Drug use: Not on file    Home Medications Prior to Admission medications   Medication Sig Start Date End Date Taking? Authorizing Provider  acetaminophen (TYLENOL) 500 MG tablet Take 1,000 mg by mouth every 6 (six) hours as needed (pain).    [provider]  ALPRAZolam Duanne Moron) 1 MG  tablet Take 1 mg by mouth 2 (two) times daily as needed for anxiety.    [provider]  buPROPion (WELLBUTRIN SR) 100 MG 12 hr tablet Take 100 mg by mouth daily. 03/28/19   [provider]  dicyclomine (BENTYL) 20 MG tablet Take 1 tablet (20 mg total) by mouth 2 (two) times daily as needed for spasms. Patient not taking: Reported on 12/07/2019 09/05/19   Caccavale, Sophia, PA-C  naloxone [facility]) 4 MG/0.1ML LIQD nasal spray kit Place 1 spray into the nose once as needed (opioid overdose).    [provider]  ondansetron (ZOFRAN ODT) 4 MG disintegrating tablet Take 1 tablet (4 mg total) by mouth every 8 (eight) hours as needed for nausea or vomiting. 09/05/19   Caccavale, Sophia, PA-C  ondansetron (ZOFRAN) 4 MG tablet Take 1 tablet (4 mg total) by mouth every 6 (six) hours as needed for nausea or vomiting. Patient not taking: Reported on 12/21/2019 04/03/19   Orpah Greek, MD  Oxycodone HCl 10 MG TABS Take 10 mg by mouth 4 (four) times daily as needed (pain).    [provider]  pantoprazole (PROTONIX) 40 MG tablet Take 40 mg by mouth daily. 09/17/19   [provider]  polyethylene glycol powder (MIRALAX) 17 GM/SCOOP powder Start taking 1 capful 3 times a  day. Slowly cut back as needed until you have normal bowel movements. Stay on 1 capful per day while on narcotic pain medication Patient taking differently: Take 17 g by mouth daily as needed (constipation).  09/22/19   Fatima Blank, MD  sucralfate (CARAFATE) 1 GM/10ML suspension Take 10 mLs (1 g total) by mouth 4 (four) times daily -  with meals and at bedtime. Patient not taking: Reported on 12/07/2019 11/10/19   Caccavale, Sophia, PA-C    Allergies    Other  Review of Systems   Review of Systems  All other systems reviewed and are negative.   Physical Exam Updated Vital Signs BP 125/63   Pulse 88   Temp 98 F (36.7 C) (Oral)   Resp 17   Ht _0  (1.778 m)   Wt 65.8 kg   SpO2  97%   BMI 20.81 kg/m   Physical Exam Vitals and nursing note reviewed.   34 year old male, resting comfortably and in no acute distress. Vital signs are normal. Oxygen saturation is 97%, which is normal. Head is normocephalic and atraumatic. PERRLA, EOMI. Oropharynx is clear. Neck is nontender and supple without adenopathy or JVD. Back is nontender and there is no CVA tenderness. Lungs are clear without rales, wheezes, or rhonchi. Chest is nontender. Heart has regular rate and rhythm without murmur. Abdomen is soft, flat, nontender without masses or hepatosplenomegaly and peristalsis is normoactive. Extremities have no cyanosis or edema, full range of motion is present. Skin is warm and dry without rash. Neurologic: Mental status is normal, cranial nerves are intact, there are no motor or sensory deficits.  ED Results / Procedures / Treatments   Labs (all labs ordered are listed, but only abnormal results are displayed) Labs Reviewed  COMPREHENSIVE METABOLIC PANEL - Abnormal; Notable for the following components:      Result Value   Glucose, Bld 159 (*)    BUN <5 (*)    All other components within normal limits  LIPASE, BLOOD  CBC WITH DIFFERENTIAL/PLATELET   Procedures Procedures   Medications Ordered in ED Medications  sodium chloride 0.9 % bolus 1,000 mL (has no administration in time range)  prochlorperazine (COMPAZINE) injection 10 mg (has no administration in time range)  HYDROmorphone (DILAUDID) injection 1 mg (has no administration in time range)    ED Course  I have reviewed the triage vital signs and the nursing notes.  Pertinent labs & imaging results that were available during my care of the patient were reviewed by me and considered in my medical decision making (see chart for details).  MDM Rules/Calculators/A&P Exacerbation of chronic abdominal pain.  Labs were significant only for mild elevation of glucose.  Old records are reviewed, and he has visits  yesterday and the day before for the same complaint.  Yesterday, he stated he was worried he was going to run out of his oxycodone before the prescription could get filled, but he assures me that he does have oxycodone at home now.  He also has hospitalizations for similar pain with most recent hospitalization on December 07, 2019.  I do not see any evidence of anything other than his chronic abdominal pain.  I do not see indication for CT imaging.  He will be given IV fluids, prochlorperazine, hydromorphone.  On both noted medication, patient is feeling better.  He is discharged with instructions to follow-up with his PCP and his gastroenterologist.  Final Clinical Impression(s) / ED Diagnoses Final diagnoses:  Chronic abdominal  pain    Rx / DC Orders ED Discharge Orders    None       Delora Fuel, MD 23/30/07 [phone]

## 2019-12-23 NOTE — ED Triage Notes (Signed)
Pt here for the 3rd day in a row with c/o abd pain /pancreatitis

## 2019-12-24 MED ORDER — HYDROMORPHONE HCL 1 MG/ML IJ SOLN
1.0000 mg | Freq: Once | INTRAMUSCULAR | Status: AC
  Administered 2019-12-24: 1 mg via INTRAVENOUS
  Filled 2019-12-24: qty 1

## 2019-12-24 MED ORDER — PROCHLORPERAZINE EDISYLATE 10 MG/2ML IJ SOLN
10.0000 mg | Freq: Once | INTRAMUSCULAR | Status: AC
  Administered 2019-12-24: 10 mg via INTRAVENOUS
  Filled 2019-12-24: qty 2

## 2019-12-24 MED ORDER — SODIUM CHLORIDE 0.9 % IV BOLUS
1000.0000 mL | Freq: Once | INTRAVENOUS | Status: AC
  Administered 2019-12-24: 1000 mL via INTRAVENOUS

## 2019-12-24 NOTE — ED Notes (Signed)
Patient verbalizes understanding of discharge instructions. Opportunity for questioning and answers were provided. Armband removed by staff, pt discharged from ED. Pt. ambulatory and discharged home.  

## 2020-01-04 ENCOUNTER — Encounter (HOSPITAL_COMMUNITY): Payer: Self-pay | Admitting: Emergency Medicine

## 2020-01-04 ENCOUNTER — Other Ambulatory Visit: Payer: Self-pay

## 2020-01-04 ENCOUNTER — Emergency Department (HOSPITAL_COMMUNITY)
Admission: EM | Admit: 2020-01-04 | Discharge: 2020-01-04 | Disposition: A | Discharge status: Home / Self Care | Payer: Medicaid Other | Source: Home / Self Care | Attending: Emergency Medicine | Admitting: Emergency Medicine

## 2020-01-04 DIAGNOSIS — F17210 Nicotine dependence, cigarettes, uncomplicated: Secondary | ICD-10-CM | POA: Insufficient documentation

## 2020-01-04 DIAGNOSIS — R112 Nausea with vomiting, unspecified: Secondary | ICD-10-CM | POA: Insufficient documentation

## 2020-01-04 DIAGNOSIS — Z79899 Other long term (current) drug therapy: Secondary | ICD-10-CM | POA: Insufficient documentation

## 2020-01-04 DIAGNOSIS — G8929 Other chronic pain: Secondary | ICD-10-CM

## 2020-01-04 DIAGNOSIS — R1010 Upper abdominal pain, unspecified: Secondary | ICD-10-CM | POA: Insufficient documentation

## 2020-01-04 DIAGNOSIS — R109 Unspecified abdominal pain: Secondary | ICD-10-CM

## 2020-01-04 LAB — URINALYSIS, ROUTINE W REFLEX MICROSCOPIC
Bilirubin Urine: NEGATIVE
Glucose, UA: NEGATIVE mg/dL
Hgb urine dipstick: NEGATIVE
Ketones, ur: 20 mg/dL — AB
Leukocytes,Ua: NEGATIVE
Nitrite: NEGATIVE
Protein, ur: NEGATIVE mg/dL
Specific Gravity, Urine: 1.013 (ref 1.005–1.030)
pH: 7 (ref 5.0–8.0)

## 2020-01-04 LAB — COMPREHENSIVE METABOLIC PANEL
ALT: 16 U/L (ref 0–44)
AST: 24 U/L (ref 15–41)
Albumin: 4.2 g/dL (ref 3.5–5.0)
Alkaline Phosphatase: 117 U/L (ref 38–126)
Anion gap: 14 (ref 5–15)
BUN: 6 mg/dL (ref 6–20)
CO2: 23 mmol/L (ref 22–32)
Calcium: 9.4 mg/dL (ref 8.9–10.3)
Chloride: 98 mmol/L (ref 98–111)
Creatinine, Ser: 0.83 mg/dL (ref 0.61–1.24)
GFR calc Af Amer: >60 mL/min (ref >60)
GFR calc non Af Amer: >60 mL/min (ref >60)
Glucose, Bld: 104 mg/dL — ABNORMAL HIGH (ref 70–99)
Potassium: 3.8 mmol/L (ref 3.5–5.1)
Sodium: 135 mmol/L (ref 135–145)
Total Bilirubin: 1.2 mg/dL (ref 0.3–1.2)
Total Protein: 7.4 g/dL (ref 6.5–8.1)

## 2020-01-04 LAB — LIPASE, BLOOD: Lipase: 14 U/L (ref 11–51)

## 2020-01-04 LAB — CBC
HCT: 44.7 % (ref 39.0–52.0)
Hemoglobin: 15 g/dL (ref 13.0–17.0)
MCH: 29.4 pg (ref 26.0–34.0)
MCHC: 33.6 g/dL (ref 30.0–36.0)
MCV: 87.5 fL (ref 80.0–100.0)
Platelets: 193 10*3/uL (ref 150–400)
RBC: 5.11 MIL/uL (ref 4.22–5.81)
RDW: 12.9 % (ref 11.5–15.5)
WBC: 11.2 10*3/uL — ABNORMAL HIGH (ref 4.0–10.5)
nRBC: 0 % (ref 0.0–0.2)

## 2020-01-04 MED ORDER — ONDANSETRON 4 MG PO TBDP: 4.0000 mg | ORAL_TABLET | Freq: Three times a day (TID) | ORAL | 0 refills | Status: DC | PRN

## 2020-01-04 MED ORDER — MORPHINE SULFATE (PF) 4 MG/ML IV SOLN
4.0000 mg | Freq: Once | INTRAVENOUS | Status: AC
  Administered 2020-01-04: 18:00:00 4 mg via INTRAVENOUS
  Filled 2020-01-04: qty 1

## 2020-01-04 MED ORDER — SODIUM CHLORIDE 0.9 % IV BOLUS (SEPSIS)
1000.0000 mL | Freq: Once | INTRAVENOUS | Status: AC
  Administered 2020-01-04: 18:00:00 1000 mL via INTRAVENOUS

## 2020-01-04 MED ORDER — ONDANSETRON HCL 4 MG/2ML IJ SOLN
4.0000 mg | Freq: Once | INTRAMUSCULAR | Status: AC
  Administered 2020-01-04: 18:00:00 4 mg via INTRAVENOUS
  Filled 2020-01-04: qty 2

## 2020-01-04 MED ORDER — HYDROMORPHONE HCL 1 MG/ML IJ SOLN
0.5000 mg | Freq: Once | INTRAMUSCULAR | Status: AC
  Administered 2020-01-04: 19:00:00 0.5 mg via INTRAVENOUS
  Filled 2020-01-04: qty 1

## 2020-01-04 MED ORDER — SODIUM CHLORIDE 0.9 % IV SOLN
1000.0000 mL | INTRAVENOUS | Status: DC
  Administered 2020-01-04: 1000 mL via INTRAVENOUS

## 2020-01-04 MED ORDER — SODIUM CHLORIDE 0.9% FLUSH: 3.0000 mL | Freq: Once | INTRAVENOUS | Status: DC

## 2020-01-04 NOTE — ED Provider Notes (Signed)
[facility] EMERGENCY DEPARTMENT Provider Note   CSN: [id number] Arrival date & time: 01/04/20  1657     History Chief Complaint  Patient presents with  . Abdominal Pain    Austin Jenkins is a 34 y.o. male.  HPI   Patient presents to the emergency room for evaluation of abdominal pain.  Patient states he is having pain in his upper abdomen.  Started a couple days ago.  It gets worse when he tries to eat or drink.  Patient has had a couple episodes of nausea and vomiting today.  He denies any fevers.  No chest pain or shortness of breath.  No dysuria.  No diarrhea.  Patient does have a history of recurrent chronic pancreatitis.  He is chronically on oxycodone.  Patient denies any alcohol use since last year.  Past Medical History:  Diagnosis Date  . Alcohol abuse   . Anxiety   . Depression   . IBS (irritable bowel syndrome)   . Pancreatitis     Patient Active Problem List   Diagnosis Date Noted  . Anxiety   . Epigastric pain   . Acute on chronic pancreatitis ([place]) 08/09/2019  . Acute alcoholic pancreatitis 96/75/9163    Past Surgical History:  Procedure Laterality Date  . CHOLECYSTECTOMY         No family history on file.  Social History   Tobacco Use  . Smoking status: Current Every Day Smoker    Packs/day: 0.50  . Smokeless tobacco: Current User  Substance Use Topics  . Alcohol use: Not Currently    Alcohol/week: 42.0 standard drinks    Types: 42 Cans of beer per week    Comment: pt states he has not had a drinik since april  . Drug use: Not on file    Home Medications Prior to Admission medications   Medication Sig Start Date End Date Taking? Authorizing Provider  acetaminophen (TYLENOL) 500 MG tablet Take 1,000 mg by mouth every 6 (six) hours as needed (pain).    [provider]  ALPRAZolam Duanne Moron) 1 MG tablet Take 1 mg by mouth 2 (two) times daily as needed for anxiety.    [provider]  buPROPion (WELLBUTRIN  SR) 100 MG 12 hr tablet Take 100 mg by mouth daily. 03/28/19   [provider]  naloxone Karma Greaser) 4 MG/0.1ML LIQD nasal spray kit Place 1 spray into the nose once as needed (opioid overdose).    [provider]  ondansetron (ZOFRAN ODT) 4 MG disintegrating tablet Take 1 tablet (4 mg total) by mouth every 8 (eight) hours as needed for nausea or vomiting. 01/04/20   Dorie Rank, MD  Oxycodone HCl 10 MG TABS Take 10 mg by mouth 4 (four) times daily as needed (pain).    [provider]  pantoprazole (PROTONIX) 40 MG tablet Take 40 mg by mouth daily. 09/17/19   [provider]  polyethylene glycol powder (MIRALAX) 17 GM/SCOOP powder Start taking 1 capful 3 times a day. Slowly cut back as needed until you have normal bowel movements. Stay on 1 capful per day while on narcotic pain medication Patient taking differently: Take 17 g by mouth daily as needed (constipation).  09/22/19   Fatima Blank, MD  dicyclomine (BENTYL) 20 MG tablet Take 1 tablet (20 mg total) by mouth 2 (two) times daily as needed for spasms. Patient not taking: Reported on 12/07/2019 09/05/19 12/24/19  Caccavale, Sophia, PA-C  sucralfate (CARAFATE) 1 GM/10ML suspension Take 10  mLs (1 g total) by mouth 4 (four) times daily -  with meals and at bedtime. Patient not taking: Reported on 12/07/2019 11/10/19 12/24/19  Caccavale, Sophia, PA-C    Allergies    Other  Review of Systems   Review of Systems  All other systems reviewed and are negative.   Physical Exam Updated Vital Signs BP 122/81   Pulse 81   Temp 98.4 F (36.9 C) (Oral)   Resp 16   Ht 1.778 m (5' 10")   Wt 65.8 kg   SpO2 99%   BMI 20.81 kg/m   Physical Exam Vitals and nursing note reviewed.  Constitutional:      General: He is not in acute distress.    Appearance: He is well-developed.  HENT:     Head: Normocephalic and atraumatic.     Right Ear: External ear normal.     Left Ear: External ear normal.  Eyes:     General:  No scleral icterus.       Right eye: No discharge.        Left eye: No discharge.     Conjunctiva/sclera: Conjunctivae normal.  Neck:     Trachea: No tracheal deviation.  Cardiovascular:     Rate and Rhythm: Normal rate and regular rhythm.  Pulmonary:     Effort: Pulmonary effort is normal. No respiratory distress.     Breath sounds: Normal breath sounds. No stridor. No wheezing or rales.  Abdominal:     General: Bowel sounds are normal. There is no distension.     Palpations: Abdomen is soft.     Tenderness: There is abdominal tenderness in the epigastric area. There is no guarding or rebound.  Musculoskeletal:        General: No tenderness.     Cervical back: Neck supple.  Skin:    General: Skin is warm and dry.     Findings: No rash.  Neurological:     Mental Status: He is alert.     Cranial Nerves: No cranial nerve deficit (no facial droop, extraocular movements intact, no slurred speech).     Sensory: No sensory deficit.     Motor: No abnormal muscle tone or seizure activity.     Coordination: Coordination normal.     ED Results / Procedures / Treatments   Labs (all labs ordered are listed, but only abnormal results are displayed) Labs Reviewed  COMPREHENSIVE METABOLIC PANEL - Abnormal; Notable for the following components:      Result Value   Glucose, Bld 104 (*)    All other components within normal limits  CBC - Abnormal; Notable for the following components:   WBC 11.2 (*)    All other components within normal limits  URINALYSIS, ROUTINE W REFLEX MICROSCOPIC - Abnormal; Notable for the following components:   Ketones, ur 20 (*)    All other components within normal limits  LIPASE, BLOOD    EKG None  Radiology No results found.  Procedures Procedures (including critical care time)  Medications Ordered in ED Medications  sodium chloride flush (NS) 0.9 % injection 3 mL (3 mLs Intravenous Not Given 01/04/20 1748)  sodium chloride 0.9 % bolus 1,000 mL (0  mLs Intravenous Stopped 01/04/20 1916)    Followed by  0.9 %  sodium chloride infusion (1,000 mLs Intravenous New Bag/Given 01/04/20 1809)  morphine 4 MG/ML injection 4 mg (4 mg Intravenous Given 01/04/20 1805)  ondansetron (ZOFRAN) injection 4 mg (4 mg Intravenous Given 01/04/20 1806)  HYDROmorphone (DILAUDID) injection 0.5 mg (0.5 mg Intravenous Given 01/04/20 1919)    ED Course  I have reviewed the triage vital signs and the nursing notes.  Pertinent labs & imaging results that were available during my care of the patient were reviewed by me and considered in my medical decision making (see chart for details).  Clinical Course as of Jan 03 1929  Sat Jan 04, 2020  1900 Labs reviewed.  Electrolyte panel unremarkable.  Lipase normal.  Slight increase in white blood cell count.  Urinalysis normal   [JK]    Clinical Course User Index [JK] Dorie Rank, MD   MDM Rules/Calculators/A&P                      Patient has recurrent bouts of abdominal pain.  He has had 11 visits to the ED in the last 6 months.  Patient was last seen in the emergency room both March 20 21st and 22nd for recurrent abdominal pain.  Symptoms felt to be the pancreatitis.   Most recent CT scan was on 3/21 that showed decreasing inflammation around pancreas.  Labs today reassuring.  Previous CT scans showed improvement.  With normal labs and vital signs do not feel repeat imaging necessary at this time.  No vomiting in the ED.  Pt was treated with pain meds.   Encouraged outpt follow up with his GI doctor.  60 oxycodone rx on 3/23   Final Clinical Impression(s) / ED Diagnoses Final diagnoses:  Chronic abdominal pain    Rx / DC Orders ED Discharge Orders         Ordered    ondansetron (ZOFRAN ODT) 4 MG disintegrating tablet  Every 8 hours PRN     01/04/20 1930           Dorie Rank, MD 01/04/20 1930

## 2020-01-04 NOTE — ED Notes (Signed)
Patient verbalizes understanding of discharge instructions. Opportunity for questioning and answers were provided. Armband removed by staff, pt discharged from ED ambulatory.   

## 2020-01-04 NOTE — Discharge Instructions (Addendum)
Continue your current medications.  Follow up with your GI doctor at [facility]

## 2020-01-04 NOTE — ED Triage Notes (Signed)
Pt to ED with c/o abd pain onset 2 days ago.  Pt seen here recently for same  Hx of pancreatitis

## 2020-01-05 ENCOUNTER — Encounter (HOSPITAL_COMMUNITY): Payer: Self-pay | Admitting: Internal Medicine

## 2020-01-05 ENCOUNTER — Other Ambulatory Visit: Payer: Self-pay

## 2020-01-05 ENCOUNTER — Inpatient Hospital Stay (HOSPITAL_COMMUNITY)
Admission: EM | Admit: 2020-01-05 | Discharge: 2020-01-07 | DRG: 440 | Discharge status: Against Medical Advice | Payer: Medicaid Other | Attending: Internal Medicine | Admitting: Internal Medicine

## 2020-01-05 DIAGNOSIS — R001 Bradycardia, unspecified: Secondary | ICD-10-CM | POA: Diagnosis present

## 2020-01-05 DIAGNOSIS — K219 Gastro-esophageal reflux disease without esophagitis: Secondary | ICD-10-CM | POA: Diagnosis present

## 2020-01-05 DIAGNOSIS — K859 Acute pancreatitis without necrosis or infection, unspecified: Secondary | ICD-10-CM | POA: Diagnosis present

## 2020-01-05 DIAGNOSIS — F419 Anxiety disorder, unspecified: Secondary | ICD-10-CM | POA: Diagnosis present

## 2020-01-05 DIAGNOSIS — R109 Unspecified abdominal pain: Secondary | ICD-10-CM | POA: Diagnosis not present

## 2020-01-05 DIAGNOSIS — R1013 Epigastric pain: Secondary | ICD-10-CM | POA: Diagnosis present

## 2020-01-05 DIAGNOSIS — Z91018 Allergy to other foods: Secondary | ICD-10-CM | POA: Diagnosis not present

## 2020-01-05 DIAGNOSIS — Z8719 Personal history of other diseases of the digestive system: Secondary | ICD-10-CM | POA: Diagnosis not present

## 2020-01-05 DIAGNOSIS — Z5329 Procedure and treatment not carried out because of patient's decision for other reasons: Secondary | ICD-10-CM | POA: Diagnosis not present

## 2020-01-05 DIAGNOSIS — R112 Nausea with vomiting, unspecified: Secondary | ICD-10-CM | POA: Diagnosis present

## 2020-01-05 DIAGNOSIS — R1084 Generalized abdominal pain: Secondary | ICD-10-CM | POA: Diagnosis not present

## 2020-01-05 DIAGNOSIS — Z79899 Other long term (current) drug therapy: Secondary | ICD-10-CM

## 2020-01-05 DIAGNOSIS — G8929 Other chronic pain: Secondary | ICD-10-CM | POA: Diagnosis present

## 2020-01-05 DIAGNOSIS — F17210 Nicotine dependence, cigarettes, uncomplicated: Secondary | ICD-10-CM | POA: Diagnosis present

## 2020-01-05 DIAGNOSIS — K861 Other chronic pancreatitis: Secondary | ICD-10-CM | POA: Diagnosis present

## 2020-01-05 DIAGNOSIS — F329 Major depressive disorder, single episode, unspecified: Secondary | ICD-10-CM | POA: Diagnosis present

## 2020-01-05 DIAGNOSIS — Z20822 Contact with and (suspected) exposure to covid-19: Secondary | ICD-10-CM | POA: Diagnosis present

## 2020-01-05 DIAGNOSIS — Z9049 Acquired absence of other specified parts of digestive tract: Secondary | ICD-10-CM | POA: Diagnosis not present

## 2020-01-05 DIAGNOSIS — K581 Irritable bowel syndrome with constipation: Secondary | ICD-10-CM | POA: Diagnosis present

## 2020-01-05 DIAGNOSIS — Z79891 Long term (current) use of opiate analgesic: Secondary | ICD-10-CM | POA: Diagnosis not present

## 2020-01-05 DIAGNOSIS — F32A Depression, unspecified: Secondary | ICD-10-CM | POA: Diagnosis present

## 2020-01-05 DIAGNOSIS — K5900 Constipation, unspecified: Secondary | ICD-10-CM

## 2020-01-05 LAB — COMPREHENSIVE METABOLIC PANEL
ALT: 16 U/L (ref 0–44)
AST: 19 U/L (ref 15–41)
Albumin: 4.1 g/dL (ref 3.5–5.0)
Alkaline Phosphatase: 98 U/L (ref 38–126)
Anion gap: 12 (ref 5–15)
BUN: <5 mg/dL — ABNORMAL LOW (ref 6–20)
CO2: 24 mmol/L (ref 22–32)
Calcium: 9.3 mg/dL (ref 8.9–10.3)
Chloride: 102 mmol/L (ref 98–111)
Creatinine, Ser: 0.86 mg/dL (ref 0.61–1.24)
GFR calc Af Amer: >60 mL/min (ref >60)
GFR calc non Af Amer: >60 mL/min (ref >60)
Glucose, Bld: 108 mg/dL — ABNORMAL HIGH (ref 70–99)
Potassium: 3.5 mmol/L (ref 3.5–5.1)
Sodium: 138 mmol/L (ref 135–145)
Total Bilirubin: 0.8 mg/dL (ref 0.3–1.2)
Total Protein: 6.9 g/dL (ref 6.5–8.1)

## 2020-01-05 LAB — URINALYSIS, ROUTINE W REFLEX MICROSCOPIC
Bilirubin Urine: NEGATIVE
Glucose, UA: NEGATIVE mg/dL
Hgb urine dipstick: NEGATIVE
Ketones, ur: 5 mg/dL — AB
Leukocytes,Ua: NEGATIVE
Nitrite: NEGATIVE
Protein, ur: NEGATIVE mg/dL
Specific Gravity, Urine: 1.002 — ABNORMAL LOW (ref 1.005–1.030)
pH: 6 (ref 5.0–8.0)

## 2020-01-05 LAB — RAPID URINE DRUG SCREEN, HOSP PERFORMED
Amphetamines: NOT DETECTED
Barbiturates: NOT DETECTED
Benzodiazepines: NOT DETECTED
Cocaine: NOT DETECTED
Opiates: POSITIVE — AB
Tetrahydrocannabinol: NOT DETECTED

## 2020-01-05 LAB — CBC
HCT: 40.9 % (ref 39.0–52.0)
Hemoglobin: 13.6 g/dL (ref 13.0–17.0)
MCH: 29.2 pg (ref 26.0–34.0)
MCHC: 33.3 g/dL (ref 30.0–36.0)
MCV: 87.8 fL (ref 80.0–100.0)
Platelets: 173 10*3/uL (ref 150–400)
RBC: 4.66 MIL/uL (ref 4.22–5.81)
RDW: 13 % (ref 11.5–15.5)
WBC: 7.5 10*3/uL (ref 4.0–10.5)
nRBC: 0 % (ref 0.0–0.2)

## 2020-01-05 LAB — SARS CORONAVIRUS 2 (TAT 6-24 HRS): SARS Coronavirus 2: NEGATIVE

## 2020-01-05 LAB — LACTIC ACID, PLASMA
Lactic Acid, Venous: 0.9 mmol/L (ref 0.5–1.9)
Lactic Acid, Venous: 0.7 mmol/L (ref 0.5–1.9)

## 2020-01-05 LAB — LIPASE, BLOOD: Lipase: 14 U/L (ref 11–51)

## 2020-01-05 LAB — ETHANOL: Alcohol, Ethyl (B): <10 mg/dL (ref <10)

## 2020-01-05 LAB — MAGNESIUM: Magnesium: 1.9 mg/dL (ref 1.7–2.4)

## 2020-01-05 LAB — TSH: TSH: 0.285 u[IU]/mL — ABNORMAL LOW (ref 0.350–4.500)

## 2020-01-05 MED ORDER — ONDANSETRON HCL 4 MG/2ML IJ SOLN
4.0000 mg | Freq: Four times a day (QID) | INTRAMUSCULAR | Status: DC | PRN
  Administered 2020-01-05 – 2020-01-06 (×4): 4 mg via INTRAVENOUS
  Filled 2020-01-05 (×4): qty 2

## 2020-01-05 MED ORDER — NICOTINE 21 MG/24HR TD PT24
21.0000 mg | MEDICATED_PATCH | Freq: Every day | TRANSDERMAL | Status: DC
  Administered 2020-01-05 – 2020-01-06 (×2): 21 mg via TRANSDERMAL
  Filled 2020-01-05 (×2): qty 1

## 2020-01-05 MED ORDER — ONDANSETRON HCL 4 MG/2ML IJ SOLN
4.0000 mg | Freq: Once | INTRAMUSCULAR | Status: AC
  Administered 2020-01-05: 12:00:00 4 mg via INTRAVENOUS
  Filled 2020-01-05: qty 2

## 2020-01-05 MED ORDER — ALUM & MAG HYDROXIDE-SIMETH 200-200-20 MG/5ML PO SUSP
30.0000 mL | Freq: Once | ORAL | Status: AC
  Administered 2020-01-05: 12:00:00 30 mL via ORAL
  Filled 2020-01-05: qty 30

## 2020-01-05 MED ORDER — OXYCODONE-ACETAMINOPHEN 5-325 MG PO TABS
1.0000 | ORAL_TABLET | Freq: Once | ORAL | Status: AC
  Administered 2020-01-05: 10:00:00 1 via ORAL
  Filled 2020-01-05: qty 1

## 2020-01-05 MED ORDER — ACETAMINOPHEN 650 MG RE SUPP: 650.0000 mg | Freq: Four times a day (QID) | RECTAL | Status: DC | PRN

## 2020-01-05 MED ORDER — SODIUM CHLORIDE 0.9 % IV BOLUS
1000.0000 mL | Freq: Once | INTRAVENOUS | Status: AC
  Administered 2020-01-05: 06:00:00 1000 mL via INTRAVENOUS

## 2020-01-05 MED ORDER — SODIUM CHLORIDE 0.9 % IV BOLUS
1000.0000 mL | Freq: Once | INTRAVENOUS | Status: AC
  Administered 2020-01-05: 1000 mL via INTRAVENOUS

## 2020-01-05 MED ORDER — FENTANYL CITRATE (PF) 100 MCG/2ML IJ SOLN
100.0000 ug | Freq: Once | INTRAMUSCULAR | Status: AC
  Administered 2020-01-05: 08:00:00 100 ug via INTRAVENOUS
  Filled 2020-01-05: qty 2

## 2020-01-05 MED ORDER — SODIUM CHLORIDE 0.9% FLUSH: 3.0000 mL | Freq: Once | INTRAVENOUS | Status: DC

## 2020-01-05 MED ORDER — LIDOCAINE VISCOUS HCL 2 % MT SOLN
15.0000 mL | Freq: Once | OROMUCOSAL | Status: AC
  Administered 2020-01-05: 15 mL via ORAL
  Filled 2020-01-05: qty 15

## 2020-01-05 MED ORDER — ENOXAPARIN SODIUM 40 MG/0.4ML ~~LOC~~ SOLN
40.0000 mg | SUBCUTANEOUS | Status: DC
  Administered 2020-01-05 – 2020-01-06 (×2): 40 mg via SUBCUTANEOUS
  Filled 2020-01-05 (×2): qty 0.4

## 2020-01-05 MED ORDER — SODIUM CHLORIDE 0.9 % IV SOLN: INTRAVENOUS | Status: DC

## 2020-01-05 MED ORDER — ONDANSETRON HCL 4 MG PO TABS: 4.0000 mg | ORAL_TABLET | Freq: Four times a day (QID) | ORAL | Status: DC | PRN

## 2020-01-05 MED ORDER — ONDANSETRON HCL 4 MG/2ML IJ SOLN
4.0000 mg | Freq: Once | INTRAMUSCULAR | Status: AC
  Administered 2020-01-05: 06:00:00 4 mg via INTRAVENOUS
  Filled 2020-01-05: qty 2

## 2020-01-05 MED ORDER — FENTANYL CITRATE (PF) 100 MCG/2ML IJ SOLN
100.0000 ug | Freq: Once | INTRAMUSCULAR | Status: AC
  Administered 2020-01-05: 06:00:00 100 ug via INTRAVENOUS
  Filled 2020-01-05: qty 2

## 2020-01-05 MED ORDER — HYDROMORPHONE HCL 1 MG/ML IJ SOLN
0.5000 mg | INTRAMUSCULAR | Status: DC | PRN
  Administered 2020-01-05 – 2020-01-07 (×10): 0.5 mg via INTRAVENOUS
  Filled 2020-01-05 (×10): qty 1

## 2020-01-05 MED ORDER — ACETAMINOPHEN 325 MG PO TABS: 650.0000 mg | ORAL_TABLET | Freq: Four times a day (QID) | ORAL | Status: DC | PRN

## 2020-01-05 NOTE — ED Provider Notes (Signed)
[place] EMERGENCY DEPARTMENT Provider Note   CSN: [id number] Arrival date & time: 01/05/20  0253     History Chief Complaint  Patient presents with  . Abdominal Pain    Austin Jenkins is a 34 y.o. male.  Patient presents to the emergency department with a chief complaint of epigastric abdominal pain.  He states that he has a history of pancreatitis.  He states that the pain radiates to his back and down the side.  He has been seen numerous times for the same.  He was seen yesterday for the same as well.  He reports worsening pain over the past day.  He reports associated nausea with vomiting.  He is prescribed oxycodone chronically by his doctor.  He denies any fevers or chills.  He denies any other associated symptoms.  The history is provided by the patient. No language interpreter was used.       Past Medical History:  Diagnosis Date  . Alcohol abuse   . Anxiety   . Depression   . IBS (irritable bowel syndrome)   . Pancreatitis     Patient Active Problem List   Diagnosis Date Noted  . Anxiety   . Epigastric pain   . Acute on chronic pancreatitis ([place]) 08/09/2019  . Acute alcoholic pancreatitis 27/74/1287    Past Surgical History:  Procedure Laterality Date  . CHOLECYSTECTOMY         No family history on file.  Social History   Tobacco Use  . Smoking status: Current Every Day Smoker    Packs/day: 0.50  . Smokeless tobacco: Current User  Substance Use Topics  . Alcohol use: Not Currently    Alcohol/week: 42.0 standard drinks    Types: 42 Cans of beer per week    Comment: pt states he has not had a drinik since april  . Drug use: Not on file    Home Medications Prior to Admission medications   Medication Sig Start Date End Date Taking? Authorizing Provider  acetaminophen (TYLENOL) 500 MG tablet Take 1,000 mg by mouth every 6 (six) hours as needed (pain).    [provider]  ALPRAZolam Duanne Moron) 1 MG tablet Take 1 mg by  mouth 2 (two) times daily as needed for anxiety.    [provider]  buPROPion (WELLBUTRIN SR) 100 MG 12 hr tablet Take 100 mg by mouth daily. 03/28/19   [provider]  naloxone Karma Greaser) 4 MG/0.1ML LIQD nasal spray kit Place 1 spray into the nose once as needed (opioid overdose).    [provider]  ondansetron (ZOFRAN ODT) 4 MG disintegrating tablet Take 1 tablet (4 mg total) by mouth every 8 (eight) hours as needed for nausea or vomiting. 01/04/20   Dorie Rank, MD  Oxycodone HCl 10 MG TABS Take 10 mg by mouth 4 (four) times daily as needed (pain).    [provider]  pantoprazole (PROTONIX) 40 MG tablet Take 40 mg by mouth daily. 09/17/19   [provider]  polyethylene glycol powder (MIRALAX) 17 GM/SCOOP powder Start taking 1 capful 3 times a day. Slowly cut back as needed until you have normal bowel movements. Stay on 1 capful per day while on narcotic pain medication Patient taking differently: Take 17 g by mouth daily as needed (constipation).  09/22/19   Fatima Blank, MD  dicyclomine (BENTYL) 20 MG tablet Take 1 tablet (20 mg total) by mouth 2 (two) times daily as needed for spasms. Patient not taking:  Reported on 12/07/2019 09/05/19 12/24/19  Caccavale, Sophia, PA-C  sucralfate (CARAFATE) 1 GM/10ML suspension Take 10 mLs (1 g total) by mouth 4 (four) times daily -  with meals and at bedtime. Patient not taking: Reported on 12/07/2019 11/10/19 12/24/19  Caccavale, Sophia, PA-C    Allergies    Other  Review of Systems   Review of Systems  All other systems reviewed and are negative.   Physical Exam Updated Vital Signs BP 112/74 (BP Location: Right Arm)   Pulse 76   Temp 98.5 F (36.9 C) (Oral)   Resp 14   SpO2 99%   Physical Exam Vitals and nursing note reviewed.  Constitutional:      Appearance: He is well-developed.  HENT:     Head: Normocephalic and atraumatic.  Eyes:     Conjunctiva/sclera: Conjunctivae normal.   Cardiovascular:     Rate and Rhythm: Normal rate and regular rhythm.     Heart sounds: No murmur.  Pulmonary:     Effort: Pulmonary effort is normal. No respiratory distress.     Breath sounds: Normal breath sounds.  Abdominal:     Palpations: Abdomen is soft.     Tenderness: There is abdominal tenderness.     Comments: Epigastric abdominal tenderness  Musculoskeletal:        General: Normal range of motion.     Cervical back: Neck supple.  Skin:    General: Skin is warm and dry.  Neurological:     Mental Status: He is alert and oriented to person, place, and time.  Psychiatric:        Mood and Affect: Mood normal.        Behavior: Behavior normal.     ED Results / Procedures / Treatments   Labs (all labs ordered are listed, but only abnormal results are displayed) Labs Reviewed  URINALYSIS, ROUTINE W REFLEX MICROSCOPIC - Abnormal; Notable for the following components:      Result Value   Color, Urine STRAW (*)    Specific Gravity, Urine 1.002 (*)    Ketones, ur 5 (*)    All other components within normal limits  COMPREHENSIVE METABOLIC PANEL - Abnormal; Notable for the following components:   Glucose, Bld 108 (*)    BUN <5 (*)    All other components within normal limits  LIPASE, BLOOD  CBC    EKG None  Radiology No results found.  Procedures Procedures (including critical care time)  Medications Ordered in ED Medications  sodium chloride flush (NS) 0.9 % injection 3 mL (has no administration in time range)  sodium chloride 0.9 % bolus 1,000 mL (has no administration in time range)  sodium chloride 0.9 % bolus 1,000 mL (has no administration in time range)  fentaNYL (SUBLIMAZE) injection 100 mcg (has no administration in time range)  ondansetron (ZOFRAN) injection 4 mg (has no administration in time range)    ED Course  I have reviewed the triage vital signs and the nursing notes.  Pertinent labs & imaging results that were available during my care of  the patient were reviewed by me and considered in my medical decision making (see chart for details).    MDM Rules/Calculators/A&P                      Patient here with abdominal pain.  The pain seems like his typical pancreatitis pain.  He has had numerous CTs.  He was evaluated yesterday for the same.  His vital  signs are stable.  His laboratory work-up is reassuring.  I discussed with the patient I would prefer not to repeat any imaging given his numerous CT scans and radiation exposure.  I will treat the patient with IV fluid, will give fentanyl for pain, and Zofran for nausea.  Old records are reviewed, he has numerous visits for the same.  Patient signed out to Haywood Pao, who will continue care.   Final Clinical Impression(s) / ED Diagnoses Final diagnoses:  None    Rx / DC Orders ED Discharge Orders    None       Montine Circle, PA-C 43/14/27 [id number]    Delora Fuel, MD 07/05/48 [phone]

## 2020-01-05 NOTE — ED Notes (Signed)
Care endorsed to Katie, RN 

## 2020-01-05 NOTE — ED Provider Notes (Signed)
Received patient care at the end of shift, please see previous provider's note for complete H&P.  This is a 34 year old male with known history of alcohol-related pancreatitis presenting complaining of persistent pain with associated nausea and vomiting for the past several days.  Denies any recent alcohol use.  He is unable to tolerate his pain medication at home and unable to keep anything down despite having antinausea medication.  Patient has been evaluated and provide symptomatic care throughout his ER stay without adequate improvement.  He is unable to tolerate p.o. and would likely benefit from admission for pain control as well as intractable nausea.  Labs are reassuring, patient has had multiple abdominal pelvis CT scan in the past with findings suggestive of pancreatitis.  Will avoid repeat imaging and instead, focus on symptom control.  Will consult for admission.  12:27 PM Appreciate consultation from [facility] Dr. Dan Maker who agrees to see and admit pt.  covid-19 screening test ordered.  Mickie Kozikowski was evaluated in Emergency Department on 01/05/2020 for the symptoms described in the history of present illness. He was evaluated in the context of the global COVID-19 pandemic, which necessitated consideration that the patient might be at risk for infection with the SARS-CoV-2 virus that causes COVID-19. Institutional protocols and algorithms that pertain to the evaluation of patients at risk for COVID-19 are in a state of rapid change based on information released by regulatory bodies including the CDC and federal and state organizations. These policies and algorithms were followed during the patient's care in the ED.   BP 95/79 (BP Location: Right Arm)   Pulse 71   Temp 98.5 F (36.9 C) (Oral)   Resp 15   Ht 5\' 10"  (1.778 m)   Wt 65 kg   SpO2 99%   BMI 20.56 kg/m   Results for orders placed or performed during the hospital encounter of 01/05/20  Urinalysis, Routine w  reflex microscopic  Result Value Ref Range   Color, Urine STRAW (A) YELLOW   APPearance CLEAR CLEAR   Specific Gravity, Urine 1.002 (L) 1.005 - 1.030   pH 6.0 5.0 - 8.0   Glucose, UA NEGATIVE NEGATIVE mg/dL   Hgb urine dipstick NEGATIVE NEGATIVE   Bilirubin Urine NEGATIVE NEGATIVE   Ketones, ur 5 (A) NEGATIVE mg/dL   Protein, ur NEGATIVE NEGATIVE mg/dL   Nitrite NEGATIVE NEGATIVE   Leukocytes,Ua NEGATIVE NEGATIVE  Lipase, blood  Result Value Ref Range   Lipase 14 11 - 51 U/L  Comprehensive metabolic panel  Result Value Ref Range   Sodium 138 135 - 145 mmol/L   Potassium 3.5 3.5 - 5.1 mmol/L   Chloride 102 98 - 111 mmol/L   CO2 24 22 - 32 mmol/L   Glucose, Bld 108 (H) 70 - 99 mg/dL   BUN <5 (L) 6 - 20 mg/dL   Creatinine, Ser 0.86 0.61 - 1.24 mg/dL   Calcium 9.3 8.9 - 10.3 mg/dL   Total Protein 6.9 6.5 - 8.1 g/dL   Albumin 4.1 3.5 - 5.0 g/dL   AST 19 15 - 41 U/L   ALT 16 0 - 44 U/L   Alkaline Phosphatase 98 38 - 126 U/L   Total Bilirubin 0.8 0.3 - 1.2 mg/dL   GFR calc non Af Amer >60 >60 mL/min   GFR calc Af Amer >60 >60 mL/min   Anion gap 12 5 - 15  CBC  Result Value Ref Range   WBC 7.5 4.0 - 10.5 K/uL   RBC  4.66 4.22 - 5.81 MIL/uL   Hemoglobin 13.6 13.0 - 17.0 g/dL   HCT 10.2 58.5 - 27.7 %   MCV 87.8 80.0 - 100.0 fL   MCH 29.2 26.0 - 34.0 pg   MCHC 33.3 30.0 - 36.0 g/dL   RDW 82.4 23.5 - 36.1 %   Platelets 173 150 - 400 K/uL   nRBC 0.0 0.0 - 0.2 %   CT ABDOMEN PELVIS W CONTRAST  Result Date: 12/22/2019 CLINICAL DATA:  Abdominal pain.  History of pancreatitis EXAM: CT ABDOMEN AND PELVIS WITH CONTRAST TECHNIQUE: Multidetector CT imaging of the abdomen and pelvis was performed using the standard protocol following bolus administration of intravenous contrast. CONTRAST:  OMNIPAQUE IOHEXOL 300 MG/ML  SOLN COMPARISON:  12/08/2019 FINDINGS: Lower chest: Left base atelectasis or scarring. No acute abnormality. Hepatobiliary: No focal liver abnormality is seen. Status  post cholecystectomy. No biliary dilatation. Pancreas: Previously seen fluid collections in the pancreatic neck and tail have decreased in size and difficult to visualize currently. Decreasing inflammation noted around the pancreas. Spleen: No focal abnormality.  Normal size. Adrenals/Urinary Tract: No adrenal abnormality. No focal renal abnormality. No stones or hydronephrosis. Urinary bladder is unremarkable. Stomach/Bowel: Normal appendix. Stomach, large and small bowel grossly unremarkable. Vascular/Lymphatic: No evidence of aneurysm or adenopathy. 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 Reproductive: No visible focal abnormality. Other: No free fluid or free air. Musculoskeletal: No acute bony abnormality. IMPRESSION: Decreasing inflammation around the pancreas with decreasing pseudocyst in the pancreatic neck and tail region, now difficult to measure. Again noted is chronic splenic vein thrombosis, unchanged. No acute process in the abdomen or pelvis. Electronically Signed   By: Charlett Nose M.D.   On: 12/22/2019 19:07   CT ABDOMEN PELVIS W CONTRAST  Result Date: 12/08/2019 CLINICAL DATA:  Abdominal pain and nausea and vomiting. Recurrent pancreatitis. EXAM: CT ABDOMEN AND PELVIS WITH CONTRAST TECHNIQUE: Multidetector CT imaging of the abdomen and pelvis was performed using the standard protocol following bolus administration of intravenous contrast. CONTRAST:  OMNIPAQUE IOHEXOL 300 MG/ML  SOLN COMPARISON:  MRI on 10/25/2019 from [facility] FINDINGS: Lower Chest: No acute findings. Hepatobiliary: Probable tiny sub-cm cyst in the left hepatic lobe is too small to characterize but. No suspicious hepatic masses identified. Prior cholecystectomy. No evidence of biliary obstruction. Pancreas: Mild peripancreatic inflammatory changes are seen with reactive thickening of the posterior wall of the stomach, consistent with acute pancreatitis. 1.4 cm cystic area in the  pancreatic neck and 3.5 x 1.6 cm cystic lesion in the pancreatic tail show no significant change, and are consistent with small pseudocysts. No new or enlarging pseudocysts identified. Spleen: Within normal limits in size and appearance. Adrenals/Urinary Tract: No masses identified. No evidence of ureteral calculi or hydronephrosis. Stomach/Bowel: Thickening of the posterior wall of the stomach is seen and attributable to acute pancreatitis. No areas of bowel wall thickening or bowel obstruction. Vascular/Lymphatic: No pathologically enlarged lymph nodes. No abdominal aortic aneurysm. Chronic splenic vein thrombosis is seen with numerous portosystemic collaterals in the upper abdomen mainly in the gastrosplenic ligament. Reproductive:  No mass or other significant abnormality. Other:  None. Musculoskeletal:  No suspicious bone lesions identified. IMPRESSION: Mild acute pancreatitis. 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 Electronically Signed   By: Danae Orleans M.D.   On: 12/08/2019 14:02      Fayrene Helper, PA-C 01/05/20 1228    Margarita Grizzle, MD 01/06/20 [phone]

## 2020-01-05 NOTE — ED Notes (Signed)
PIV initiated, 18G to L forearm. IV flushes with 10 cc NS without s/s of infiltration. Positive blood return noted. Secured with tape and tegaderm. meds given per [facility]. Name/DOB verified with pt

## 2020-01-05 NOTE — H&P (Signed)
History and Physical    Wyeth Hoffer TIR:[id number] DOB: 1986-07-16 DOA: 01/05/2020  PCP: [facility]  Patient coming from: Home  I have personally briefly reviewed patient's old medical records in [place]  Chief Complaint: Epigastric pain and intractable nausea and vomiting since 2 days  HPI: Austin Jenkins is a 34 y.o. male with medical history significant of chronic pancreatitis, former alcohol abuse, current tobacco abuse, depression/anxiety, GERD presents to emergency department due to severe epigastric pain and intractable nausea and vomiting since 2 days.  Patient tells me that he came to the ED yesterday with similar symptoms and was discharged home in stable condition.  He was doing fine after the discharge for some time however he started having epigastric pain again which was severe in intensity, 8-10/10, radiates to her back, associated with nausea and vomiting, 5-6 episodes since last night.  Reports mild left-sided chest pain and chronic constipation.  No history of headache, blurry vision, shortness of breath, palpitation, leg swelling, fever, chills, hematemesis, melena, urinary or bowel changes.  He lives with his wife and 2-year-old son, smokes 10 cigarettes/day, denies alcohol, illicit drug use.  He tells me that he quit alcohol last year.  ED Course: Upon arrival to ED: Patient heart rate: In 82s, respiratory rate in 20s.  Afebrile with no leukocytosis, initial work-up such as CBC, CMP, lipase, UA: Within normal limits.  Patient received IV fluids, fentanyl, Percocet, Zofran, MiraLAX in the ED.  [facility] hospitalist consulted for admission for intractable nausea and vomiting and epigastric pain.    Review of Systems: As per HPI otherwise negative.    Past Medical History:  Diagnosis Date  . Alcohol abuse   . Anxiety   . Depression   . IBS (irritable bowel syndrome)   . Pancreatitis     Past Surgical History:  Procedure Laterality Date  .  CHOLECYSTECTOMY       reports that he has been smoking. He has been smoking about 0.50 packs per day. He uses smokeless tobacco. He reports previous alcohol use of about 42.0 standard drinks of alcohol per week. No history on file for drug.  Allergies  Allergen Reactions  . Other Anaphylaxis    mayonnaise    No family history on file.  Prior to Admission medications   Medication Sig Start Date End Date Taking? Authorizing Provider  acetaminophen (TYLENOL) 500 MG tablet Take 1,000 mg by mouth every 6 (six) hours as needed (pain).    [provider]  ALPRAZolam Duanne Moron) 1 MG tablet Take 1 mg by mouth 2 (two) times daily as needed for anxiety.    [provider]  buPROPion (WELLBUTRIN SR) 100 MG 12 hr tablet Take 100 mg by mouth daily. 03/28/19   [provider]  naloxone Karma Greaser) 4 MG/0.1ML LIQD nasal spray kit Place 1 spray into the nose once as needed (opioid overdose).    [provider]  ondansetron (ZOFRAN ODT) 4 MG disintegrating tablet Take 1 tablet (4 mg total) by mouth every 8 (eight) hours as needed for nausea or vomiting. 01/04/20   Dorie Rank, MD  Oxycodone HCl 10 MG TABS Take 10 mg by mouth 4 (four) times daily as needed (pain).    [provider]  pantoprazole (PROTONIX) 40 MG tablet Take 40 mg by mouth daily. 09/17/19   [provider]  polyethylene glycol powder (MIRALAX) 17 GM/SCOOP powder Start taking 1 capful 3 times a day. Slowly cut back as needed until you have normal  bowel movements. Stay on 1 capful per day while on narcotic pain medication Patient taking differently: Take 17 g by mouth daily as needed (constipation).  09/22/19   Fatima Blank, MD  dicyclomine (BENTYL) 20 MG tablet Take 1 tablet (20 mg total) by mouth 2 (two) times daily as needed for spasms. Patient not taking: Reported on 12/07/2019 09/05/19 12/24/19  Caccavale, Sophia, PA-C  sucralfate (CARAFATE) 1 GM/10ML suspension Take 10 mLs (1 g total) by  mouth 4 (four) times daily -  with meals and at bedtime. Patient not taking: Reported on 12/07/2019 11/10/19 12/24/19  Franchot Heidelberg, PA-C    Physical Exam: Vitals:   01/05/20 0830 01/05/20 0845 01/05/20 0915 01/05/20 0946  BP: 110/79 115/77 111/79 95/79  Pulse: (!) 52 (!) 54 (!) 51 71  Resp: 17 (!) 22 (!) 21 15  Temp:      TempSrc:      SpO2: 98% 98% 98% 99%  Weight:      Height:        Constitutional: NAD, calm, comfortable, communicating well,  eyes: PERRL, lids and conjunctivae normal ENMT: Mucous membranes are dry. Posterior pharynx clear of any exudate or lesions.poor dentition.  Whitish plaque noted on tongue Neck: normal, supple, no masses, no thyromegaly Respiratory: clear to auscultation bilaterally, no wheezing, no crackles. Normal respiratory effort. No accessory muscle use.  Cardiovascular: Regular rate and rhythm, no murmurs / rubs / gallops. No extremity edema. 2+ pedal pulses. No carotid bruits.  Abdomen: Epigastric tenderness positive, no guarding, no rigidity, no masses palpated. No hepatosplenomegaly. Bowel sounds positive.  Musculoskeletal: no clubbing / cyanosis. No joint deformity upper and lower extremities. Good ROM, no contractures. Normal muscle tone.  Skin: no rashes, lesions, ulcers. No induration Neurologic: CN 2-12 grossly intact. Sensation intact, DTR normal. Strength 5/5 in all 4.  Psychiatric: Normal judgment and insight. Alert and oriented x 3. Normal mood.    Labs on Admission: I have personally reviewed following labs and imaging studies  CBC: Recent Labs  Lab 01/04/20 1720 01/05/20 0310  WBC 11.2* 7.5  HGB 15.0 13.6  HCT 44.7 40.9  MCV 87.5 87.8  PLT 193 161   Basic Metabolic Panel: Recent Labs  Lab 01/04/20 1720 01/05/20 0310  NA 135 138  K 3.8 3.5  CL 98 102  CO2 23 24  GLUCOSE 104* 108*  BUN 6 <5*  CREATININE 0.83 0.86  CALCIUM 9.4 9.3   GFR: Estimated Creatinine Clearance: 111.3 mL/min (by C-G formula based on SCr of  0.86 mg/dL). Liver Function Tests: Recent Labs  Lab 01/04/20 1720 01/05/20 0310  AST 24 19  ALT 16 16  ALKPHOS 117 98  BILITOT 1.2 0.8  PROT 7.4 6.9  ALBUMIN 4.2 4.1   Recent Labs  Lab 01/04/20 1720 01/05/20 0310  LIPASE 14 14   No results for input(s): AMMONIA in the last 168 hours. Coagulation Profile: No results for input(s): INR, PROTIME in the last 168 hours. Cardiac Enzymes: No results for input(s): CKTOTAL, CKMB, CKMBINDEX, TROPONINI in the last 168 hours. BNP (last 3 results) No results for input(s): PROBNP in the last 8760 hours. HbA1C: No results for input(s): HGBA1C in the last 72 hours. CBG: No results for input(s): GLUCAP in the last 168 hours. Lipid Profile: No results for input(s): CHOL, HDL, LDLCALC, TRIG, CHOLHDL, LDLDIRECT in the last 72 hours. Thyroid Function Tests: No results for input(s): TSH, T4TOTAL, FREET4, T3FREE, THYROIDAB in the last 72 hours. Anemia Panel: No results for input(s): VITAMINB12,  FOLATE, FERRITIN, TIBC, IRON, RETICCTPCT in the last 72 hours. Urine analysis:    Component Value Date/Time   COLORURINE STRAW (A) 01/05/2020 0319   APPEARANCEUR CLEAR 01/05/2020 0319   LABSPEC 1.002 (L) 01/05/2020 0319   PHURINE 6.0 01/05/2020 0319   GLUCOSEU NEGATIVE 01/05/2020 0319   HGBUR NEGATIVE 01/05/2020 0319   BILIRUBINUR NEGATIVE 01/05/2020 0319   KETONESUR 5 (A) 01/05/2020 0319   PROTEINUR NEGATIVE 01/05/2020 0319   NITRITE NEGATIVE 01/05/2020 0319   LEUKOCYTESUR NEGATIVE 01/05/2020 0319    Radiological Exams on Admission: No results found.  Assessment/Plan Principal Problem:   Intractable nausea and vomiting Active Problems:   Acute on chronic pancreatitis (HCC)   Epigastric pain   Anxiety   Bradycardia   Depression   Intractable nausea and vomiting and epigastric pain: -Likely secondary from acute on chronic pancreatitis.  Had CT abdomen/pelvis twice last month.  Lipase: WNL.  UA: Negative.  COVID-19 pending.  He is  afebrile with no leukocytosis. -Admit patient on the floor. -Check UDS.  Start on IV fluids, Zofran as needed for nausea and vomiting and Dilaudid as needed for pain control. -We will keep him n.p.o. for now  Bradycardia: Heart rate noted to be in 50s. -We will get EKG, TSH  Anxiety/depression: We will hold his home meds as patient is n.p.o.  Tobacco abuse: Continue nicotine patch  Chronic constipation: Hold MiraLAX for now.  Unable to safely start patient's home meds as med reconciliation is pending by pharmacy.  DVT prophylaxis: Lovenox/SCD/TED Code Status: Full code Family Communication: None present at bedside.  Plan of care discussed with patient in length and he verbalized understanding and agreed with it. Disposition Plan: Likely home in 1 to 2 days Consults called: None Admission status: Inpatient   Mckinley Jewel MD [facility] Pager [phone]  If 7PM-7AM, please contact night-coverage [email or web address] Password [facility]  01/05/2020, 12:25 PM

## 2020-01-05 NOTE — ED Triage Notes (Signed)
Abdominal pain with nausea. Was her yesterday same thing.

## 2020-01-06 ENCOUNTER — Inpatient Hospital Stay (HOSPITAL_COMMUNITY): Payer: Medicaid Other

## 2020-01-06 DIAGNOSIS — R001 Bradycardia, unspecified: Secondary | ICD-10-CM

## 2020-01-06 DIAGNOSIS — K861 Other chronic pancreatitis: Secondary | ICD-10-CM

## 2020-01-06 DIAGNOSIS — F419 Anxiety disorder, unspecified: Secondary | ICD-10-CM

## 2020-01-06 DIAGNOSIS — R1013 Epigastric pain: Secondary | ICD-10-CM

## 2020-01-06 DIAGNOSIS — K859 Acute pancreatitis without necrosis or infection, unspecified: Principal | ICD-10-CM

## 2020-01-06 LAB — CBC
HCT: 37.1 % — ABNORMAL LOW (ref 39.0–52.0)
Hemoglobin: 12.2 g/dL — ABNORMAL LOW (ref 13.0–17.0)
MCH: 29.3 pg (ref 26.0–34.0)
MCHC: 32.9 g/dL (ref 30.0–36.0)
MCV: 89.2 fL (ref 80.0–100.0)
Platelets: 123 10*3/uL — ABNORMAL LOW (ref 150–400)
RBC: 4.16 MIL/uL — ABNORMAL LOW (ref 4.22–5.81)
RDW: 13.2 % (ref 11.5–15.5)
WBC: 4.9 10*3/uL (ref 4.0–10.5)
nRBC: 0 % (ref 0.0–0.2)

## 2020-01-06 LAB — BASIC METABOLIC PANEL
Anion gap: 11 (ref 5–15)
BUN: <5 mg/dL — ABNORMAL LOW (ref 6–20)
CO2: 24 mmol/L (ref 22–32)
Calcium: 8.7 mg/dL — ABNORMAL LOW (ref 8.9–10.3)
Chloride: 109 mmol/L (ref 98–111)
Creatinine, Ser: 0.91 mg/dL (ref 0.61–1.24)
GFR calc Af Amer: >60 mL/min (ref >60)
GFR calc non Af Amer: >60 mL/min (ref >60)
Glucose, Bld: 88 mg/dL (ref 70–99)
Potassium: 4 mmol/L (ref 3.5–5.1)
Sodium: 144 mmol/L (ref 135–145)

## 2020-01-06 LAB — T4, FREE: Free T4: 0.82 ng/dL (ref 0.61–1.12)

## 2020-01-06 MED ORDER — BUPROPION HCL ER (SR) 100 MG PO TB12
100.0000 mg | ORAL_TABLET | Freq: Every day | ORAL | Status: DC
  Administered 2020-01-06: 100 mg via ORAL
  Filled 2020-01-06 (×2): qty 1

## 2020-01-06 MED ORDER — SENNOSIDES-DOCUSATE SODIUM 8.6-50 MG PO TABS
1.0000 | ORAL_TABLET | Freq: Two times a day (BID) | ORAL | Status: DC
  Administered 2020-01-06: 1 via ORAL
  Filled 2020-01-06 (×2): qty 1

## 2020-01-06 MED ORDER — POLYETHYLENE GLYCOL 3350 17 G PO PACK
17.0000 g | PACK | Freq: Two times a day (BID) | ORAL | Status: DC
  Administered 2020-01-06: 17 g via ORAL
  Filled 2020-01-06 (×2): qty 1

## 2020-01-06 MED ORDER — PANTOPRAZOLE SODIUM 40 MG PO TBEC
40.0000 mg | DELAYED_RELEASE_TABLET | Freq: Every day | ORAL | Status: DC
  Administered 2020-01-06: 40 mg via ORAL
  Filled 2020-01-06: qty 1

## 2020-01-06 MED ORDER — ALPRAZOLAM 0.5 MG PO TABS: 1.0000 mg | ORAL_TABLET | Freq: Two times a day (BID) | ORAL | Status: DC | PRN

## 2020-01-06 NOTE — Progress Notes (Signed)
PROGRESS NOTE  Austin Jenkins OBS:[id number] DOB: 02-16-1986 DOA: 01/05/2020 [facility]CP: [facility]  HPI/Recap of past 24 hours: HPI from Austin Jenkins is a 34 y.o. male with medical history significant of chronic pancreatitis, former alcohol abuse, current tobacco abuse, depression/anxiety, GERD presents to emergency department due to severe epigastric pain and intractable nausea and vomiting since 2 days. Recently discharged from the ED feeling better, but back again with epigastric pain, severe in intensity, 8-10/10, radiates to back, associated with nausea and vomiting, chronic constipation. Denies alcohol, illicit drug use.  He tells me that he quit alcohol last year. In the ED, HR in the 50s, initial work-up such as CBC, CMP, lipase, UA: Within normal limits.  Patient received IV fluids, fentanyl, Percocet, Zofran, MiraLAX in the ED.  [facility] hospitalist consulted for admission for intractable nausea and vomiting and epigastric pain.      Today, patient still reports about 8/10 epigastric pain, nausea/vomiting has improved some, willing to try clear liquids.  Denies any chest pain, shortness of breath, fever/chills, diarrhea.    Assessment/Plan: Principal Problem:   Intractable nausea and vomiting Active Problems:   Acute on chronic pancreatitis (HCC)   Epigastric pain   Anxiety   Bradycardia   Depression   Possible acute on chronic pancreatitis Currently afebrile, with no leukocytosis Lipase WNL CT abdomen/pelvis done twice in the month of March, last showing resolving pancreatitis on 12/22/2019 Continue IV fluids, pain management, antiemetics Advance to clear liquids  Bradycardia Completely asymptomatic EKG with sinus bradycardia TSH low 0.285, free T4 pending  History of IBS with constipation ??  Above symptoms possibly related to IBS DG abdomen to rule out any acute issues/constipation MiraLAX twice daily, senna twice daily  Tobacco  abuse Continue nicotine patch  Anxiety/depression Continue Xanax, Wellbutrin        Malnutrition Type:      Malnutrition Characteristics:      Nutrition Interventions:       Estimated body mass index is 21.61 kg/m as calculated from the following:   Height as of this encounter: 5\' 10"  (1.778 m).   Weight as of this encounter: 68.3 kg.     Code Status: Full  Family Communication: Discussed extensively with patient  Disposition Plan: Plan to DC home in about 24 to 48 hours, once able to tolerate by mouth and symptoms improve   Consultants:  None  Procedures:  None  Antimicrobials:  None  DVT prophylaxis: Lovenox   Objective: Vitals:   01/05/20 1330 01/05/20 1333 01/05/20 2106 01/06/20 0812  BP:  113/80 107/72 112/78  Pulse:  (!) 49 (!) 46 (!) 42  Resp:  17 16   Temp:   98 F (36.7 C) 97.8 F (36.6 C)  TempSrc:      SpO2:  99% 100% 99%  Weight: 68.3 kg     Height: 5\' 10"  (1.778 m)       Intake/Output Summary (Last 24 hours) at 01/06/2020 1135 Last data filed at 01/06/2020 0630 Gross per 24 hour  Intake 2048.55 ml  Output --  Net 2048.55 ml   Filed Weights   01/05/20 0718 01/05/20 1330  Weight: 65 kg 68.3 kg    Exam:  General: NAD   Cardiovascular: S1, S2 present  Respiratory: CTAB  Abdomen: Soft, epigastric tenderness, nondistended, bowel sounds present  Musculoskeletal: No bilateral pedal edema noted  Skin: Normal  Psychiatry: Normal mood   Data Reviewed: CBC: Recent Labs  Lab 01/04/20 1720 01/05/20 0310 01/06/20  0232  WBC 11.2* 7.5 4.9  HGB 15.0 13.6 12.2*  HCT 44.7 40.9 37.1*  MCV 87.5 87.8 89.2  PLT 193 173 629*   Basic Metabolic Panel: Recent Labs  Lab 01/04/20 1720 01/05/20 0310 01/05/20 1344 01/06/20 0232  NA 135 138  --  144  K 3.8 3.5  --  4.0  CL 98 102  --  109  CO2 23 24  --  24  GLUCOSE 104* 108*  --  88  BUN 6 <5*  --  <5*  CREATININE 0.83 0.86  --  0.91  CALCIUM 9.4 9.3  --  8.7*  MG   --   --  1.9  --    GFR: Estimated Creatinine Clearance: 110.5 mL/min (by C-G formula based on SCr of 0.91 mg/dL). Liver Function Tests: Recent Labs  Lab 01/04/20 1720 01/05/20 0310  AST 24 19  ALT 16 16  ALKPHOS 117 98  BILITOT 1.2 0.8  PROT 7.4 6.9  ALBUMIN 4.2 4.1   Recent Labs  Lab 01/04/20 1720 01/05/20 0310  LIPASE 14 14   No results for input(s): AMMONIA in the last 168 hours. Coagulation Profile: No results for input(s): INR, PROTIME in the last 168 hours. Cardiac Enzymes: No results for input(s): CKTOTAL, CKMB, CKMBINDEX, TROPONINI in the last 168 hours. BNP (last 3 results) No results for input(s): PROBNP in the last 8760 hours. HbA1C: No results for input(s): HGBA1C in the last 72 hours. CBG: No results for input(s): GLUCAP in the last 168 hours. Lipid Profile: No results for input(s): CHOL, HDL, LDLCALC, TRIG, CHOLHDL, LDLDIRECT in the last 72 hours. Thyroid Function Tests: Recent Labs    01/05/20 1344  TSH 0.285*   Anemia Panel: No results for input(s): VITAMINB12, FOLATE, FERRITIN, TIBC, IRON, RETICCTPCT in the last 72 hours. Urine analysis:    Component Value Date/Time   COLORURINE STRAW (A) 01/05/2020 0319   APPEARANCEUR CLEAR 01/05/2020 0319   LABSPEC 1.002 (L) 01/05/2020 0319   PHURINE 6.0 01/05/2020 0319   GLUCOSEU NEGATIVE 01/05/2020 0319   HGBUR NEGATIVE 01/05/2020 0319   BILIRUBINUR NEGATIVE 01/05/2020 0319   KETONESUR 5 (A) 01/05/2020 0319   PROTEINUR NEGATIVE 01/05/2020 0319   NITRITE NEGATIVE 01/05/2020 0319   LEUKOCYTESUR NEGATIVE 01/05/2020 0319   Sepsis Labs: @LABRCNTIP (procalcitonin:4,lacticidven:4)  ) Recent Results (from the past 240 hour(s))  SARS CORONAVIRUS 2 (TAT 6-24 HRS) Nasopharyngeal Nasopharyngeal Swab     Status: None   Collection Time: 01/05/20 12:49 PM   Specimen: Nasopharyngeal Swab  Result Value Ref Range Status   SARS Coronavirus 2 NEGATIVE NEGATIVE Final    Comment: (NOTE) SARS-CoV-2 target nucleic  acids are NOT DETECTED. The SARS-CoV-2 RNA is generally detectable in upper and lower respiratory specimens during the acute phase of infection. Negative results do not preclude SARS-CoV-2 infection, do not rule out co-infections with other pathogens, and should not be used as the sole basis for treatment or other patient management decisions. Negative results must be combined with clinical observations, patient history, and epidemiological information. The expected result is Negative. Fact Sheet for Patients: SugarRoll.be Fact Sheet for Healthcare Providers: https://www.woods-mathews.com/ This test is not yet approved or cleared by the [place] FDA and  has been authorized for detection and/or diagnosis of SARS-CoV-2 by FDA under an Emergency Use Authorization (EUA). This EUA will remain  in effect (meaning this test can be used) for the duration of the COVID-19 declaration under Section 56 4(b)(1) of the Act, 21 U.S.C. section 360bbb-3(b)(1), unless the  authorization is terminated or revoked sooner. Performed at [facility], [address] [place], [place] [address]       Studies: No results found.  Scheduled Meds: . enoxaparin (LOVENOX) injection  40 mg Subcutaneous Q24H  . nicotine  21 mg Transdermal Daily  . sodium chloride flush  3 mL Intravenous Once    Continuous Infusions: . sodium chloride 125 mL/hr at 01/06/20 0611     LOS: 1 day     Briant Cedar, MD [facility]  If 7PM-7AM, please contact night-coverage [email or web address] 01/06/2020, 11:35 AM

## 2020-01-07 ENCOUNTER — Emergency Department (HOSPITAL_COMMUNITY)
Admission: EM | Admit: 2020-01-07 | Discharge: 2020-01-07 | Disposition: A | Discharge status: Home / Self Care | Payer: Medicaid Other | Attending: Emergency Medicine | Admitting: Emergency Medicine

## 2020-01-07 ENCOUNTER — Encounter (HOSPITAL_COMMUNITY): Payer: Self-pay

## 2020-01-07 DIAGNOSIS — R1084 Generalized abdominal pain: Secondary | ICD-10-CM | POA: Insufficient documentation

## 2020-01-07 DIAGNOSIS — R112 Nausea with vomiting, unspecified: Secondary | ICD-10-CM | POA: Insufficient documentation

## 2020-01-07 DIAGNOSIS — F17210 Nicotine dependence, cigarettes, uncomplicated: Secondary | ICD-10-CM | POA: Insufficient documentation

## 2020-01-07 DIAGNOSIS — Z79899 Other long term (current) drug therapy: Secondary | ICD-10-CM | POA: Insufficient documentation

## 2020-01-07 LAB — URINALYSIS, ROUTINE W REFLEX MICROSCOPIC
Bilirubin Urine: NEGATIVE
Glucose, UA: NEGATIVE mg/dL
Hgb urine dipstick: NEGATIVE
Ketones, ur: 20 mg/dL — AB
Leukocytes,Ua: NEGATIVE
Nitrite: NEGATIVE
Protein, ur: NEGATIVE mg/dL
Specific Gravity, Urine: 1.005 (ref 1.005–1.030)
pH: 6 (ref 5.0–8.0)

## 2020-01-07 LAB — COMPREHENSIVE METABOLIC PANEL
ALT: 12 U/L (ref 0–44)
AST: 18 U/L (ref 15–41)
Albumin: 3.8 g/dL (ref 3.5–5.0)
Alkaline Phosphatase: 84 U/L (ref 38–126)
Anion gap: 14 (ref 5–15)
BUN: <5 mg/dL — ABNORMAL LOW (ref 6–20)
CO2: 22 mmol/L (ref 22–32)
Calcium: 9 mg/dL (ref 8.9–10.3)
Chloride: 102 mmol/L (ref 98–111)
Creatinine, Ser: 0.94 mg/dL (ref 0.61–1.24)
GFR calc Af Amer: >60 mL/min (ref >60)
GFR calc non Af Amer: >60 mL/min (ref >60)
Glucose, Bld: 100 mg/dL — ABNORMAL HIGH (ref 70–99)
Potassium: 3.8 mmol/L (ref 3.5–5.1)
Sodium: 138 mmol/L (ref 135–145)
Total Bilirubin: 1 mg/dL (ref 0.3–1.2)
Total Protein: 6.4 g/dL — ABNORMAL LOW (ref 6.5–8.1)

## 2020-01-07 LAB — CBC
HCT: 39 % (ref 39.0–52.0)
Hemoglobin: 12.8 g/dL — ABNORMAL LOW (ref 13.0–17.0)
MCH: 29.2 pg (ref 26.0–34.0)
MCHC: 32.8 g/dL (ref 30.0–36.0)
MCV: 88.8 fL (ref 80.0–100.0)
Platelets: 141 10*3/uL — ABNORMAL LOW (ref 150–400)
RBC: 4.39 MIL/uL (ref 4.22–5.81)
RDW: 12.7 % (ref 11.5–15.5)
WBC: 5.8 10*3/uL (ref 4.0–10.5)
nRBC: 0 % (ref 0.0–0.2)

## 2020-01-07 LAB — CBC WITH DIFFERENTIAL/PLATELET
Abs Immature Granulocytes: 0.01 10*3/uL (ref 0.00–0.07)
Basophils Absolute: 0 10*3/uL (ref 0.0–0.1)
Basophils Relative: 0 %
Eosinophils Absolute: 0.2 10*3/uL (ref 0.0–0.5)
Eosinophils Relative: 4 %
HCT: 37.8 % — ABNORMAL LOW (ref 39.0–52.0)
Hemoglobin: 12.5 g/dL — ABNORMAL LOW (ref 13.0–17.0)
Immature Granulocytes: 0 %
Lymphocytes Relative: 25 %
Lymphs Abs: 1.2 10*3/uL (ref 0.7–4.0)
MCH: 29.3 pg (ref 26.0–34.0)
MCHC: 33.1 g/dL (ref 30.0–36.0)
MCV: 88.5 fL (ref 80.0–100.0)
Monocytes Absolute: 0.3 10*3/uL (ref 0.1–1.0)
Monocytes Relative: 5 %
Neutro Abs: 3.2 10*3/uL (ref 1.7–7.7)
Neutrophils Relative %: 66 %
Platelets: 123 10*3/uL — ABNORMAL LOW (ref 150–400)
RBC: 4.27 MIL/uL (ref 4.22–5.81)
RDW: 12.8 % (ref 11.5–15.5)
WBC: 4.8 10*3/uL (ref 4.0–10.5)
nRBC: 0 % (ref 0.0–0.2)

## 2020-01-07 LAB — BASIC METABOLIC PANEL
Anion gap: 12 (ref 5–15)
BUN: <5 mg/dL — ABNORMAL LOW (ref 6–20)
CO2: 21 mmol/L — ABNORMAL LOW (ref 22–32)
Calcium: 8.8 mg/dL — ABNORMAL LOW (ref 8.9–10.3)
Chloride: 107 mmol/L (ref 98–111)
Creatinine, Ser: 0.79 mg/dL (ref 0.61–1.24)
GFR calc Af Amer: >60 mL/min (ref >60)
GFR calc non Af Amer: >60 mL/min (ref >60)
Glucose, Bld: 84 mg/dL (ref 70–99)
Potassium: 3.9 mmol/L (ref 3.5–5.1)
Sodium: 140 mmol/L (ref 135–145)

## 2020-01-07 LAB — LIPASE, BLOOD: Lipase: 14 U/L (ref 11–51)

## 2020-01-07 MED ORDER — SODIUM CHLORIDE 0.9 % IV BOLUS
1000.0000 mL | Freq: Once | INTRAVENOUS | Status: AC
  Administered 2020-01-07: 1000 mL via INTRAVENOUS

## 2020-01-07 MED ORDER — ONDANSETRON HCL 4 MG/2ML IJ SOLN
4.0000 mg | Freq: Once | INTRAMUSCULAR | Status: AC
  Administered 2020-01-07: 4 mg via INTRAVENOUS
  Filled 2020-01-07: qty 2

## 2020-01-07 MED ORDER — SODIUM CHLORIDE 0.9% FLUSH: 3.0000 mL | Freq: Once | INTRAVENOUS | Status: DC

## 2020-01-07 MED ORDER — PANTOPRAZOLE SODIUM 40 MG PO TBEC
40.0000 mg | DELAYED_RELEASE_TABLET | Freq: Once | ORAL | Status: AC
  Administered 2020-01-07: 40 mg via ORAL
  Filled 2020-01-07: qty 1

## 2020-01-07 MED ORDER — FENTANYL CITRATE (PF) 100 MCG/2ML IJ SOLN
50.0000 ug | Freq: Once | INTRAMUSCULAR | Status: AC
  Administered 2020-01-07: 12:00:00 50 ug via INTRAVENOUS
  Filled 2020-01-07: qty 2

## 2020-01-07 MED ORDER — SENNA 8.6 MG PO TABS
1.0000 | ORAL_TABLET | Freq: Once | ORAL | Status: AC
  Administered 2020-01-07: 8.6 mg via ORAL
  Filled 2020-01-07: qty 1

## 2020-01-07 NOTE — Progress Notes (Addendum)
Paged on call hospitalist re: pt has appointment to refill pain medications this AM ~0800, and would like early d/c orders.   1146: Pt educated on risk v. Benefit of leaving AMA, but pt endorses that he would like to leave AMA in order to have time to go home to get ready then arrive at his doctor's appointment on time to refill his pain medication. On call hospitalist paged.  0500: Received call back from [place], on call hospitalist. Relayed message to pt that his pain medications may not be refilled because he would not have his hospital d/c papers in hand which would inform that he had been taking narcotics here at the hospital. Pt was also informed of insurance coverage.   Pt continued to insist that he needed to make his appointment to refill his pain medications, and that he would not be able to reschedule. Rescheduling assistance was offered multiple times.

## 2020-01-07 NOTE — ED Provider Notes (Signed)
[facility] EMERGENCY DEPARTMENT Provider Note   CSN: [id number] Arrival date & time: 01/07/20  3474     History Chief Complaint  Patient presents with  . Abdominal Pain    Austin Jenkins is a 34 y.o. male.  34 year old male with past medical history of IBS and pancreatitis presents with complaint of ongoing abdominal pain and vomiting.  Patient was admitted to the hospital 2 days ago, left AMA this morning around 5 AM stating that he had to get to a doctor's appointment at 8 AM today to get his pain medication refilled and this appointment could not be rescheduled.  Patient states that he went home, had had water to drink and then his pain and vomiting returned so he came back to the emergency room and could not make it to his 8 AM appointment.  Patient reports generalized abdominal pain that is constant, aching in nature, worse after drinking water today.  Denies changes in bowel habits, fevers, chills.  No other complaints or concerns today.  Is managed by GI at [facility].        Past Medical History:  Diagnosis Date  . Alcohol abuse   . Anxiety   . Depression   . IBS (irritable bowel syndrome)   . Pancreatitis     Patient Active Problem List   Diagnosis Date Noted  . Bradycardia 01/05/2020  . Intractable nausea and vomiting 01/05/2020  . Depression   . Anxiety   . Epigastric pain   . Acute on chronic pancreatitis ([place]) 08/09/2019  . Acute alcoholic pancreatitis 25/95/6387    Past Surgical History:  Procedure Laterality Date  . CHOLECYSTECTOMY         No family history on file.  Social History   Tobacco Use  . Smoking status: Current Every Day Smoker    Packs/day: 0.50  . Smokeless tobacco: Current User  Substance Use Topics  . Alcohol use: Not Currently    Alcohol/week: 42.0 standard drinks    Types: 42 Cans of beer per week    Comment: pt states he has not had a drinik since april  . Drug use: Never    Home Medications Prior  to Admission medications   Medication Sig Start Date End Date Taking? Authorizing Provider  acetaminophen (TYLENOL) 500 MG tablet Take 1,000 mg by mouth every 6 (six) hours as needed (pain).   Yes [provider]  ALPRAZolam Duanne Moron) 1 MG tablet Take 1 mg by mouth 2 (two) times daily as needed for anxiety.   Yes [provider]  buPROPion (WELLBUTRIN SR) 100 MG 12 hr tablet Take 100 mg by mouth daily. 03/28/19  Yes [provider]  ondansetron (ZOFRAN ODT) 4 MG disintegrating tablet Take 1 tablet (4 mg total) by mouth every 8 (eight) hours as needed for nausea or vomiting. 01/04/20  Yes Dorie Rank, MD  Oxycodone HCl 10 MG TABS Take 10 mg by mouth 4 (four) times daily as needed (pain).   Yes [provider]  pantoprazole (PROTONIX) 40 MG tablet Take 40 mg by mouth daily. 09/17/19  Yes [provider]  polyethylene glycol powder (MIRALAX) 17 GM/SCOOP powder Start taking 1 capful 3 times a day. Slowly cut back as needed until you have normal bowel movements. Stay on 1 capful per day while on narcotic pain medication Patient taking differently: Take 17 g by mouth daily as needed (constipation).  09/22/19  Yes Cardama, Grayce Sessions, MD  naloxone [facility]) 4 MG/0.1ML LIQD nasal  spray kit Place 1 spray into the nose once as needed (opioid overdose).    [provider]  dicyclomine (BENTYL) 20 MG tablet Take 1 tablet (20 mg total) by mouth 2 (two) times daily as needed for spasms. Patient not taking: Reported on 12/07/2019 09/05/19 12/24/19  Caccavale, Sophia, PA-C  sucralfate (CARAFATE) 1 GM/10ML suspension Take 10 mLs (1 g total) by mouth 4 (four) times daily -  with meals and at bedtime. Patient not taking: Reported on 12/07/2019 11/10/19 12/24/19  Caccavale, Sophia, PA-C    Allergies    Other  Review of Systems   Review of Systems  Constitutional: Negative for chills and fever.  Respiratory: Negative for shortness of breath.   Cardiovascular: Negative for  chest pain.  Gastrointestinal: Positive for abdominal pain, nausea and vomiting. Negative for constipation and diarrhea.  Genitourinary: Negative for difficulty urinating.  Skin: Negative for rash and wound.  Allergic/Immunologic: Negative for immunocompromised state.  All other systems reviewed and are negative.   Physical Exam Updated Vital Signs BP 120/74 (BP Location: Left Arm)   Pulse (!) 50   Temp 97.7 F (36.5 C) (Oral)   Resp 14   SpO2 99%   Physical Exam Vitals and nursing note reviewed.  Constitutional:      General: He is not in acute distress.    Appearance: He is well-developed. He is not diaphoretic.  HENT:     Head: Normocephalic and atraumatic.  Cardiovascular:     Rate and Rhythm: Normal rate and regular rhythm.     Heart sounds: Normal heart sounds.  Pulmonary:     Effort: Pulmonary effort is normal.     Breath sounds: Normal breath sounds.  Abdominal:     General: Abdomen is flat.     Tenderness: There is generalized abdominal tenderness.  Skin:    General: Skin is warm and dry.     Findings: No rash.  Neurological:     Mental Status: He is alert and oriented to person, place, and time.  Psychiatric:        Behavior: Behavior normal.     ED Results / Procedures / Treatments   Labs (all labs ordered are listed, but only abnormal results are displayed) Labs Reviewed  COMPREHENSIVE METABOLIC PANEL - Abnormal; Notable for the following components:      Result Value   Glucose, Bld 100 (*)    BUN <5 (*)    Total Protein 6.4 (*)    All other components within normal limits  CBC - Abnormal; Notable for the following components:   Hemoglobin 12.8 (*)    Platelets 141 (*)    All other components within normal limits  URINALYSIS, ROUTINE W REFLEX MICROSCOPIC - Abnormal; Notable for the following components:   Color, Urine STRAW (*)    Ketones, ur 20 (*)    All other components within normal limits  LIPASE, BLOOD    EKG None  Radiology DG Abd  Portable 1V  Result Date: 01/06/2020 CLINICAL DATA:  Abdominal pain with constipation EXAM: PORTABLE ABDOMEN - 1 VIEW COMPARISON:  September 22, 2019 FINDINGS: Moderate stool present in colon. There is no bowel dilatation or air-fluid level to suggest bowel obstruction. No free air. There are surgical clips in the gallbladder fossa region. IMPRESSION: No bowel obstruction or free air evident.  Moderate stool in colon. Electronically Signed   By: Lowella Grip III M.D.   On: 01/06/2020 13:38    Procedures Procedures (including critical care time)  Medications Ordered in ED Medications  sodium chloride flush (NS) 0.9 % injection 3 mL (3 mLs Intravenous Not Given 01/07/20 1154)  ondansetron (ZOFRAN) injection 4 mg (4 mg Intravenous Given 01/07/20 1155)  senna (SENOKOT) tablet 8.6 mg (8.6 mg Oral Given 01/07/20 1157)  pantoprazole (PROTONIX) EC tablet 40 mg (40 mg Oral Given 01/07/20 1157)  fentaNYL (SUBLIMAZE) injection 50 mcg (50 mcg Intravenous Given 01/07/20 1158)  sodium chloride 0.9 % bolus 1,000 mL (1,000 mLs Intravenous New Bag/Given 01/07/20 1154)    ED Course  I have reviewed the triage vital signs and the nursing notes.  Pertinent labs & imaging results that were available during my care of the patient were reviewed by me and considered in my medical decision making (see chart for details).  Clinical Course as of Jan 06 1313  Tue Jan 07, 3084  [id number] 34 year old male with history of pancreatitis presents with complaint of ongoing abdominal pain with nausea vomiting.  Patient went to the hospital AMA this morning in hopes to get to an 8 AM appointment however states after he got home he drank some water and continued to vomit so he came back to the emergency room.  Review of recent hospitalization, patient seemed to be doing well with plan for discharge in the near future.  Patient was given fentanyl, Protonix, senna and Zofran as well as fluids.  Patient reports feeling better, has passed a p.o.  challenge and is requesting to be discharged to go to a 3:00 doctor's appointment. Review of labs, CBC, CMP and urinalysis without significant findings, lipase within normal limits.  Vitals normal. Patient be discharged as requested with plan to follow-up with his provider.   [LM]    Clinical Course User Index [LM] Roque Lias   MDM Rules/Calculators/A&P                      Final Clinical Impression(s) / ED Diagnoses Final diagnoses:  Generalized abdominal pain  Non-intractable vomiting with nausea, unspecified vomiting type    Rx / DC Orders ED Discharge Orders    None       Tacy Learn, PA-C 01/07/20 1314    Sherwood Gambler, MD 01/13/20 1657

## 2020-01-07 NOTE — Discharge Instructions (Addendum)
Follow-up with your primary care provider.  Take your medications as prescribed.

## 2020-01-07 NOTE — ED Notes (Signed)
Pt given water, tolerating fluids at this time

## 2020-01-07 NOTE — ED Triage Notes (Signed)
Pt was here and recently admitted for pancreatitis, c/o of continued abd pain and n/v

## 2020-01-13 ENCOUNTER — Ambulatory Visit: Payer: Medicaid Other | Attending: Internal Medicine

## 2020-01-13 DIAGNOSIS — Z23 Encounter for immunization: Secondary | ICD-10-CM

## 2020-01-13 NOTE — Progress Notes (Signed)
   Covid-19 Vaccination Clinic  Name:  Austin Jenkins    MRN: [id number] DOB: May 11, 1986  01/13/2020  [name] was observed post Covid-19 immunization for 15 minutes without incident. He was provided with Vaccine Information Sheet and instruction to access the V-Safe system.   [name] was instructed to call 911 with any severe reactions post vaccine: Marland Kitchen Difficulty breathing  . Swelling of face and throat  . A fast heartbeat  . A bad rash all over body  . Dizziness and weakness   Immunizations Administered    Name Date Dose VIS Date Route   Pfizer COVID-19 Vaccine 01/13/2020  3:50 PM 0.3 mL 09/13/2019 Intramuscular   Manufacturer: [employer], [employer]   Lot: [id number]   NDC: [address]-5

## 2020-01-17 ENCOUNTER — Encounter (INDEPENDENT_AMBULATORY_CARE_PROVIDER_SITE_OTHER): Payer: Self-pay

## 2020-01-19 ENCOUNTER — Other Ambulatory Visit: Payer: Self-pay

## 2020-01-19 ENCOUNTER — Emergency Department (HOSPITAL_COMMUNITY)
Admission: EM | Admit: 2020-01-19 | Discharge: 2020-01-20 | Disposition: A | Discharge status: Home / Self Care | Payer: Medicaid Other | Attending: Emergency Medicine | Admitting: Emergency Medicine

## 2020-01-19 DIAGNOSIS — R1012 Left upper quadrant pain: Secondary | ICD-10-CM | POA: Diagnosis present

## 2020-01-19 DIAGNOSIS — F17210 Nicotine dependence, cigarettes, uncomplicated: Secondary | ICD-10-CM | POA: Insufficient documentation

## 2020-01-19 LAB — CBC
HCT: 47.1 % (ref 39.0–52.0)
Hemoglobin: 15 g/dL (ref 13.0–17.0)
MCH: 29.1 pg (ref 26.0–34.0)
MCHC: 31.8 g/dL (ref 30.0–36.0)
MCV: 91.5 fL (ref 80.0–100.0)
Platelets: 200 10*3/uL (ref 150–400)
RBC: 5.15 MIL/uL (ref 4.22–5.81)
RDW: 13.2 % (ref 11.5–15.5)
WBC: 7.2 10*3/uL (ref 4.0–10.5)
nRBC: 0 % (ref 0.0–0.2)

## 2020-01-19 MED ORDER — SODIUM CHLORIDE 0.9% FLUSH: 3.0000 mL | Freq: Once | INTRAVENOUS | Status: DC

## 2020-01-19 NOTE — ED Triage Notes (Signed)
Pt is having abdominal pain, with nausea, vomiting. Pt has hx of pancreatitis.

## 2020-01-20 ENCOUNTER — Encounter (HOSPITAL_COMMUNITY): Payer: Self-pay | Admitting: Student

## 2020-01-20 LAB — COMPREHENSIVE METABOLIC PANEL
ALT: 20 U/L (ref 0–44)
AST: 23 U/L (ref 15–41)
Albumin: 4.2 g/dL (ref 3.5–5.0)
Alkaline Phosphatase: 103 U/L (ref 38–126)
Anion gap: 12 (ref 5–15)
BUN: <5 mg/dL — ABNORMAL LOW (ref 6–20)
CO2: 25 mmol/L (ref 22–32)
Calcium: 9.6 mg/dL (ref 8.9–10.3)
Chloride: 101 mmol/L (ref 98–111)
Creatinine, Ser: 0.81 mg/dL (ref 0.61–1.24)
GFR calc Af Amer: >60 mL/min (ref >60)
GFR calc non Af Amer: >60 mL/min (ref >60)
Glucose, Bld: 138 mg/dL — ABNORMAL HIGH (ref 70–99)
Potassium: 4.1 mmol/L (ref 3.5–5.1)
Sodium: 138 mmol/L (ref 135–145)
Total Bilirubin: 0.7 mg/dL (ref 0.3–1.2)
Total Protein: 7.1 g/dL (ref 6.5–8.1)

## 2020-01-20 LAB — URINALYSIS, ROUTINE W REFLEX MICROSCOPIC
Bilirubin Urine: NEGATIVE
Glucose, UA: NEGATIVE mg/dL
Hgb urine dipstick: NEGATIVE
Ketones, ur: NEGATIVE mg/dL
Leukocytes,Ua: NEGATIVE
Nitrite: NEGATIVE
Protein, ur: NEGATIVE mg/dL
Specific Gravity, Urine: 1.005 (ref 1.005–1.030)
pH: 7 (ref 5.0–8.0)

## 2020-01-20 LAB — LIPASE, BLOOD: Lipase: 14 U/L (ref 11–51)

## 2020-01-20 MED ORDER — FENTANYL CITRATE (PF) 100 MCG/2ML IJ SOLN
50.0000 ug | Freq: Once | INTRAMUSCULAR | Status: AC
  Administered 2020-01-20: 50 ug via INTRAVENOUS
  Filled 2020-01-20: qty 2

## 2020-01-20 MED ORDER — OMEPRAZOLE 20 MG PO CPDR: 20.0000 mg | DELAYED_RELEASE_CAPSULE | Freq: Every day | ORAL | 0 refills | Status: AC

## 2020-01-20 MED ORDER — SODIUM CHLORIDE 0.9 % IV BOLUS
1000.0000 mL | Freq: Once | INTRAVENOUS | Status: AC
  Administered 2020-01-20: 08:00:00 1000 mL via INTRAVENOUS

## 2020-01-20 MED ORDER — LIDOCAINE VISCOUS HCL 2 % MT SOLN
15.0000 mL | Freq: Once | OROMUCOSAL | Status: AC
  Administered 2020-01-20: 15 mL via ORAL
  Filled 2020-01-20: qty 15

## 2020-01-20 MED ORDER — ALUM & MAG HYDROXIDE-SIMETH 200-200-20 MG/5ML PO SUSP
30.0000 mL | Freq: Once | ORAL | Status: AC
  Administered 2020-01-20: 10:00:00 30 mL via ORAL
  Filled 2020-01-20: qty 30

## 2020-01-20 MED ORDER — ONDANSETRON HCL 4 MG/2ML IJ SOLN
4.0000 mg | Freq: Once | INTRAMUSCULAR | Status: AC
  Administered 2020-01-20: 08:00:00 4 mg via INTRAVENOUS
  Filled 2020-01-20: qty 2

## 2020-01-20 MED ORDER — HYDROMORPHONE HCL 1 MG/ML IJ SOLN
0.5000 mg | Freq: Once | INTRAMUSCULAR | Status: AC
  Administered 2020-01-20: 10:00:00 0.5 mg via INTRAVENOUS
  Filled 2020-01-20: qty 1

## 2020-01-20 MED ORDER — FAMOTIDINE IN NACL 20-0.9 MG/50ML-% IV SOLN
20.0000 mg | Freq: Once | INTRAVENOUS | Status: AC
  Administered 2020-01-20: 20 mg via INTRAVENOUS
  Filled 2020-01-20: qty 50

## 2020-01-20 MED ORDER — ONDANSETRON 4 MG PO TBDP: 4.0000 mg | ORAL_TABLET | Freq: Three times a day (TID) | ORAL | 0 refills | Status: AC | PRN

## 2020-01-20 NOTE — ED Provider Notes (Signed)
[facility] EMERGENCY DEPARTMENT Provider Note   CSN: [id number] Arrival date & time: 01/19/20  2307     History Chief Complaint  Patient presents with  . Abdominal Pain    Austin Jenkins is a 34 y.o. male with a history of IBS, anxiety, depression, alcohol abuse, prior cholecystectomy, & pancreatitis who presents to the ED with complaints of recurrent abdominal pain since yesterday.  Patient states the pain is in the left upper quadrant of the abdomen, waxes/wanes, sharp in nature, worse when he eats/drinks, no alleviating factors.  Reports associated nausea with 4 episodes of vomiting.  States the symptoms feel the same as his prior pancreatitis.  He has been unable to keep down his 10 mg oxycodone which he takes every 6 hours as needed.  Denies fever, chills, hematemesis, melena, hematochezia, constipation, diarrhea, dysuria, hematuria, chest pain, or dyspnea.  States that he quit drinking alcohol about a year ago.  Denies drug use.  HPI     Past Medical History:  Diagnosis Date  . Alcohol abuse   . Anxiety   . Depression   . IBS (irritable bowel syndrome)   . Pancreatitis     Patient Active Problem List   Diagnosis Date Noted  . Bradycardia 01/05/2020  . Intractable nausea and vomiting 01/05/2020  . Depression   . Anxiety   . Epigastric pain   . Acute on chronic pancreatitis ([place]) 08/09/2019  . Acute alcoholic pancreatitis 62/37/6283    Past Surgical History:  Procedure Laterality Date  . CHOLECYSTECTOMY         No family history on file.  Social History   Tobacco Use  . Smoking status: Current Every Day Smoker    Packs/day: 0.50  . Smokeless tobacco: Current User  Substance Use Topics  . Alcohol use: Not Currently    Alcohol/week: 42.0 standard drinks    Types: 42 Cans of beer per week    Comment: pt states he has not had a drinik since april  . Drug use: Never    Home Medications Prior to Admission medications   Medication Sig  Start Date End Date Taking? Authorizing Provider  acetaminophen (TYLENOL) 500 MG tablet Take 1,000 mg by mouth every 6 (six) hours as needed (pain).    [provider]  ALPRAZolam Duanne Moron) 1 MG tablet Take 1 mg by mouth 2 (two) times daily as needed for anxiety.    [provider]  buPROPion (WELLBUTRIN SR) 100 MG 12 hr tablet Take 100 mg by mouth daily. 03/28/19   [provider]  naloxone Karma Greaser) 4 MG/0.1ML LIQD nasal spray kit Place 1 spray into the nose once as needed (opioid overdose).    [provider]  ondansetron (ZOFRAN ODT) 4 MG disintegrating tablet Take 1 tablet (4 mg total) by mouth every 8 (eight) hours as needed for nausea or vomiting. 01/04/20   Dorie Rank, MD  Oxycodone HCl 10 MG TABS Take 10 mg by mouth 4 (four) times daily as needed (pain).    [provider]  pantoprazole (PROTONIX) 40 MG tablet Take 40 mg by mouth daily. 09/17/19   [provider]  polyethylene glycol powder (MIRALAX) 17 GM/SCOOP powder Start taking 1 capful 3 times a day. Slowly cut back as needed until you have normal bowel movements. Stay on 1 capful per day while on narcotic pain medication Patient taking differently: Take 17 g by mouth daily as needed (constipation).  09/22/19   Fatima Blank, MD  dicyclomine (  BENTYL) 20 MG tablet Take 1 tablet (20 mg total) by mouth 2 (two) times daily as needed for spasms. Patient not taking: Reported on 12/07/2019 09/05/19 12/24/19  Caccavale, Sophia, PA-C  sucralfate (CARAFATE) 1 GM/10ML suspension Take 10 mLs (1 g total) by mouth 4 (four) times daily -  with meals and at bedtime. Patient not taking: Reported on 12/07/2019 11/10/19 12/24/19  Caccavale, Sophia, PA-C    Allergies    Other  Review of Systems   Review of Systems  Constitutional: Negative for chills and fever.  Respiratory: Negative for shortness of breath.   Cardiovascular: Negative for chest pain.  Gastrointestinal: Positive for abdominal pain,  nausea and vomiting. Negative for blood in stool, constipation and diarrhea.  Genitourinary: Negative for discharge, dysuria, hematuria, penile pain, penile swelling and scrotal swelling.  Neurological: Negative for syncope.  All other systems reviewed and are negative.   Physical Exam Updated Vital Signs BP 119/86   Pulse 93   Temp 98.6 F (37 C) (Oral)   Resp 18   Ht 5' 10"  (1.778 m)   Wt 68 kg   SpO2 100%   BMI 21.52 kg/m   Physical Exam Vitals and nursing note reviewed.  Constitutional:      General: He is not in acute distress.    Appearance: He is well-developed. He is not toxic-appearing.  HENT:     Head: Normocephalic and atraumatic.  Eyes:     General:        Right eye: No discharge.        Left eye: No discharge.     Conjunctiva/sclera: Conjunctivae normal.  Cardiovascular:     Rate and Rhythm: Normal rate and regular rhythm.  Pulmonary:     Effort: Pulmonary effort is normal. No respiratory distress.     Breath sounds: Normal breath sounds. No wheezing, rhonchi or rales.  Abdominal:     General: There is no distension.     Palpations: Abdomen is soft.     Tenderness: There is abdominal tenderness (generalized, most focal in the LUQ).     Comments: Lidoderm patch present in LUQ with some surrounding skin irritation, no purulent drainage, warmth, or fluctuance.   Musculoskeletal:     Cervical back: Neck supple.  Skin:    General: Skin is warm and dry.     Findings: No rash.  Neurological:     Mental Status: He is alert.     Comments: Clear speech.   Psychiatric:        Behavior: Behavior normal.     ED Results / Procedures / Treatments   Labs (all labs ordered are listed, but only abnormal results are displayed) Labs Reviewed  COMPREHENSIVE METABOLIC PANEL - Abnormal; Notable for the following components:      Result Value   Glucose, Bld 138 (*)    BUN <5 (*)    All other components within normal limits  LIPASE, BLOOD  CBC  URINALYSIS, ROUTINE  W REFLEX MICROSCOPIC    EKG EKG Interpretation  Date/Time:  Sunday January 19 2020 23:25:45 EDT Ventricular Rate:  81 PR Interval:  138 QRS Duration: 88 QT Interval:  368 QTC Calculation: 427 R Axis:   74 Text Interpretation: Normal sinus rhythm Normal ECG Normal ECG When compared with ECG of 01/05/2020, HEART RATE has increased Confirmed by Delora Fuel ([id number]) on 01/19/2020 11:41:49 PM   Radiology No results found.  Procedures Procedures (including critical care time)  Medications Ordered in ED Medications  sodium chloride  flush (NS) 0.9 % injection 3 mL (has no administration in time range)    ED Course  I have reviewed the triage vital signs and the nursing notes.  Pertinent labs & imaging results that were available during my care of the patient were reviewed by me and considered in my medical decision making (see chart for details).    MDM Rules/Calculators/A&P                      This patient presents to the ED for concern of abdominal pain with N/V since yesterday. Reports history of similar with recurrent pancreatitis. On exam generalized abdominal tenderness noted with most tenderness in the LUQ. No peritoneal signs. DDx: pancreatitis, gastritis, GERD, IBS, nephrolithiasis, perf, obstruction.   Additional history obtained:  Previous records obtained and reviewed:  6 ED visits for abdominal pain within past 1 month.  CT Abdomen/pelvis w contrast 12/22/19 IMPRESSION: Decreasing inflammation around the pancreas with decreasing pseudocyst in the pancreatic neck and tail region, now difficult to Measure. Again noted is chronic splenic vein thrombosis, unchanged. No acute process in the abdomen or pelvis.  Multiple additional prior CTs reviewed.   Lab Tests:  I reviewed and interpreted labs, which included:  CBC, CMP, lipase, UA--> unremarkable, no leukocytosis, anemia, LFT elevation, renal dysfunction, or elevation in lipase  Medicines/Re-evaluations:  I  ordered medication fentanyl, zofran, pepcid, & fluids for pain & nausea.   09:15: RE-EVAL: Patient feeling somewhat improved, current pain 6/10 in severity, Dilaudid & GI cocktail ordered.   10:45: RE-EVAL: Patient states he feels much better, tolerating PO, repeat abdominal exam without peritoneal signs- doubt perf, obstruction, appendicitis or other acute surgical pathology.  Patient states this feels similar to his prior pancreatitis, history of similar with imaging within the past 1 month, he is afebrile, no leukocytosis, remains without peritoneal signs therefore do not feel repeat imaging is necessary at this time.  Given he has had improvement with pain control feel he is appropriate for discharge home.  He takes oxycodone for chronic pain, instructed him to continue this to help with abdominal pain.  Will discharge home with Zofran and omeprazole.  Recommended clear liquid diet for the next 48 hours.  PCP/GI follow-up. I discussed results, treatment plan, need for follow-up, and return precautions with the patient. Provided opportunity for questions, patient confirmed understanding and is in agreement with plan.    Portions of this note were generated with [employer]. Dictation errors may occur despite best attempts at proofreading.  Final Clinical Impression(s) / ED Diagnoses Final diagnoses:  Left upper quadrant abdominal pain    Rx / DC Orders ED Discharge Orders         Ordered    ondansetron (ZOFRAN ODT) 4 MG disintegrating tablet  Every 8 hours PRN     01/20/20 1052    omeprazole (PRILOSEC) 20 MG capsule  Daily     01/20/20 [address] [place], PA-C 01/20/20 1056    Charlesetta Shanks, MD 01/27/20 2307

## 2020-01-20 NOTE — ED Notes (Signed)
D/c'd by [place] Grose RN.

## 2020-01-20 NOTE — Discharge Instructions (Signed)
You were seen in the emergency department today for abdominal pain.  Your work-up was overall reassuring.  Your labs were that your blood sugar was somewhat elevated, please have this rechecked by your primary care provider within 1 to 2 weeks.  We recommend following a clear liquid diet for the next 48 hours to help with pain.  Please continue to take your oxycodone at home as prescribed.  We are also sending you home with Zofran to take every 8 hours as needed for nausea and vomiting as well as omeprazole to take once in the morning prior to your first meal to help with stomach acidity.  We have prescribed you new medication(s) today. Discuss the medications prescribed today with your pharmacist as they can have adverse effects and interactions with your other medicines including over the counter and prescribed medications. Seek medical evaluation if you start to experience new or abnormal symptoms after taking one of these medicines, seek care immediately if you start to experience difficulty breathing, feeling of your throat closing, facial swelling, or rash as these could be indications of a more serious allergic reaction  Please follow-up with your primary care provider and/or your gastroenterologist within the next 3 days.  Return to the ER for new or worsening symptoms including but not limited to worsening pain, inability to keep fluids down, fever, blood in your vomit, blood in your stool, or any other concerns.

## 2020-01-28 NOTE — Discharge Summary (Signed)
Discharge Summary  Austin Jenkins ZOX:[id number] DOB: Mar 18, 1986  PCP: [facility]  Admit date: 01/05/2020 Discharge date: 01/07/20  Time spent: 35 mins  Recommendations for Outpatient Follow-up:  1. None- Signed AMA  Discharge Diagnoses:  Active Hospital Problems   Diagnosis Date Noted  . Intractable nausea and vomiting 01/05/2020  . Bradycardia 01/05/2020  . Depression   . Anxiety   . Epigastric pain   . Acute on chronic pancreatitis ([place]) 08/09/2019    Resolved Hospital Problems  No resolved problems to display.    Discharge Condition: Signed AMA  Diet recommendation: None  Vitals:   01/06/20 0812 01/06/20 1558  BP: 112/78 117/72  Pulse: (!) 42 81  Resp:    Temp: 97.8 F (36.6 C) 98.4 F (36.9 C)  SpO2: 99% 99%    History of present illness:  Austin Rymshawis a 34 y.o.malewith medical history significant ofchronic pancreatitis, former alcohol abuse, current tobacco abuse, depression/anxiety, GERD presents to emergency department due to severe epigastric pain and intractable nausea and vomiting since 2 days. Recently discharged from the ED feeling better, but back again with epigastric pain, severe in intensity, 8-10/10, radiates to back, associated with nausea and vomiting, chronic constipation. Denies alcohol, illicit drug use. He tells me that he quit alcohol last year. In the ED, HR in the 50s, initial work-up such as CBC, CMP, lipase, UA: Within normal limits. Patient received IV fluids, fentanyl, Percocet, Zofran, MiraLAX in the ED. [facility] hospitalist consulted for admission for intractable nausea and vomiting and epigastric pain.   Patient signed [facility] Course:  Principal Problem:   Intractable nausea and vomiting Active Problems:   Acute on chronic pancreatitis (HCC)   Epigastric pain   Anxiety   Bradycardia   Depression  Possible acute on chronic pancreatitis Lipase WNL CT abdomen/pelvis done twice in the month of March,  last showing resolving pancreatitis on 12/22/2019 S/P IV fluids, pain management, antiemetics  Bradycardia Completely asymptomatic EKG with sinus bradycardia TSH low 0.285, free T4 WNL  History of IBS with constipation ??  Above symptoms possibly related to IBS DG abdomen showed mod stool burden, otherwise unremarkable  Tobacco abuse Advised to quit  Anxiety/depression Xanax, Wellbutrin          Malnutrition Type:      Malnutrition Characteristics:      Nutrition Interventions:      Estimated body mass index is 21.61 kg/m as calculated from the following:   Height as of this encounter: 5' 10"  (1.778 m).   Weight as of this encounter: 68.3 kg.    Procedures:  None  Consultations:  None  Discharge Exam: BP 117/72 (BP Location: Right Arm)   Pulse 81   Temp 98.4 F (36.9 C)   Resp 16   Ht 5' 10"  (1.778 m)   Wt 68.3 kg   SpO2 99%   BMI 21.61 kg/m   General:  Cardiovascular:  Respiratory:    Discharge Instructions You were cared for by a hospitalist during your hospital stay. If you have any questions about your discharge medications or the care you received while you were in the hospital after you are discharged, you can call the unit and asked to speak with the hospitalist on call if the hospitalist that took care of you is not available. Once you are discharged, your primary care physician will handle any further medical issues. Please note that NO REFILLS for any discharge medications will be authorized once you are discharged, as it is  imperative that you return to your primary care physician (or establish a relationship with a primary care physician if you do not have one) for your aftercare needs so that they can reassess your need for medications and monitor your lab values.   Allergies as of 01/07/2020      Reactions   Other Anaphylaxis   mayonnaise      Medication List    ASK your doctor about these medications   acetaminophen 500  MG tablet Commonly known as: TYLENOL Take 1,000 mg by mouth every 6 (six) hours as needed (pain).   ALPRAZolam 1 MG tablet Commonly known as: XANAX Take 1 mg by mouth 2 (two) times daily as needed for anxiety.   buPROPion 100 MG 12 hr tablet Commonly known as: WELLBUTRIN SR Take 100 mg by mouth daily.   Narcan 4 MG/0.1ML Liqd nasal spray kit Generic drug: naloxone Place 1 spray into the nose once as needed (opioid overdose).   Oxycodone HCl 10 MG Tabs Take 10 mg by mouth 4 (four) times daily as needed (pain).   pantoprazole 40 MG tablet Commonly known as: PROTONIX Take 40 mg by mouth daily.   polyethylene glycol powder 17 GM/SCOOP powder Commonly known as: MiraLax Start taking 1 capful 3 times a day. Slowly cut back as needed until you have normal bowel movements. Stay on 1 capful per day while on narcotic pain medication      Allergies  Allergen Reactions  . Other Anaphylaxis    mayonnaise      The results of significant diagnostics from this hospitalization (including imaging, microbiology, ancillary and laboratory) are listed below for reference.    Significant Diagnostic Studies: DG Abd Portable 1V  Result Date: 01/06/2020 CLINICAL DATA:  Abdominal pain with constipation EXAM: PORTABLE ABDOMEN - 1 VIEW COMPARISON:  September 22, 2019 FINDINGS: Moderate stool present in colon. There is no bowel dilatation or air-fluid level to suggest bowel obstruction. No free air. There are surgical clips in the gallbladder fossa region. IMPRESSION: No bowel obstruction or free air evident.  Moderate stool in colon. Electronically Signed   By: Lowella Grip III M.D.   On: 01/06/2020 13:38    Microbiology: No results found for this or any previous visit (from the past 240 hour(s)).   Labs: Basic Metabolic Panel: No results for input(s): NA, K, CL, CO2, GLUCOSE, BUN, CREATININE, CALCIUM, MG, PHOS in the last 168 hours. Liver Function Tests: No results for input(s): AST, ALT,  ALKPHOS, BILITOT, PROT, ALBUMIN in the last 168 hours. No results for input(s): LIPASE, AMYLASE in the last 168 hours. No results for input(s): AMMONIA in the last 168 hours. CBC: No results for input(s): WBC, NEUTROABS, HGB, HCT, MCV, PLT in the last 168 hours. Cardiac Enzymes: No results for input(s): CKTOTAL, CKMB, CKMBINDEX, TROPONINI in the last 168 hours. BNP: BNP (last 3 results) No results for input(s): BNP in the last 8760 hours.  ProBNP (last 3 results) No results for input(s): PROBNP in the last 8760 hours.  CBG: No results for input(s): GLUCAP in the last 168 hours.     Signed:  Alma Friendly, MD [facility] 01/28/2020, 4:30 PM

## 2021-05-12 IMAGING — DX DG CHEST 2V
2 series · 2 of 2 positions shown · non-contrast
Comparison: Chest radiograph 10/24/2018

CLINICAL DATA: Pt c/o of cp, sob and dizziness since today.

EXAM:
CHEST - 2 VIEW

[chest pa]
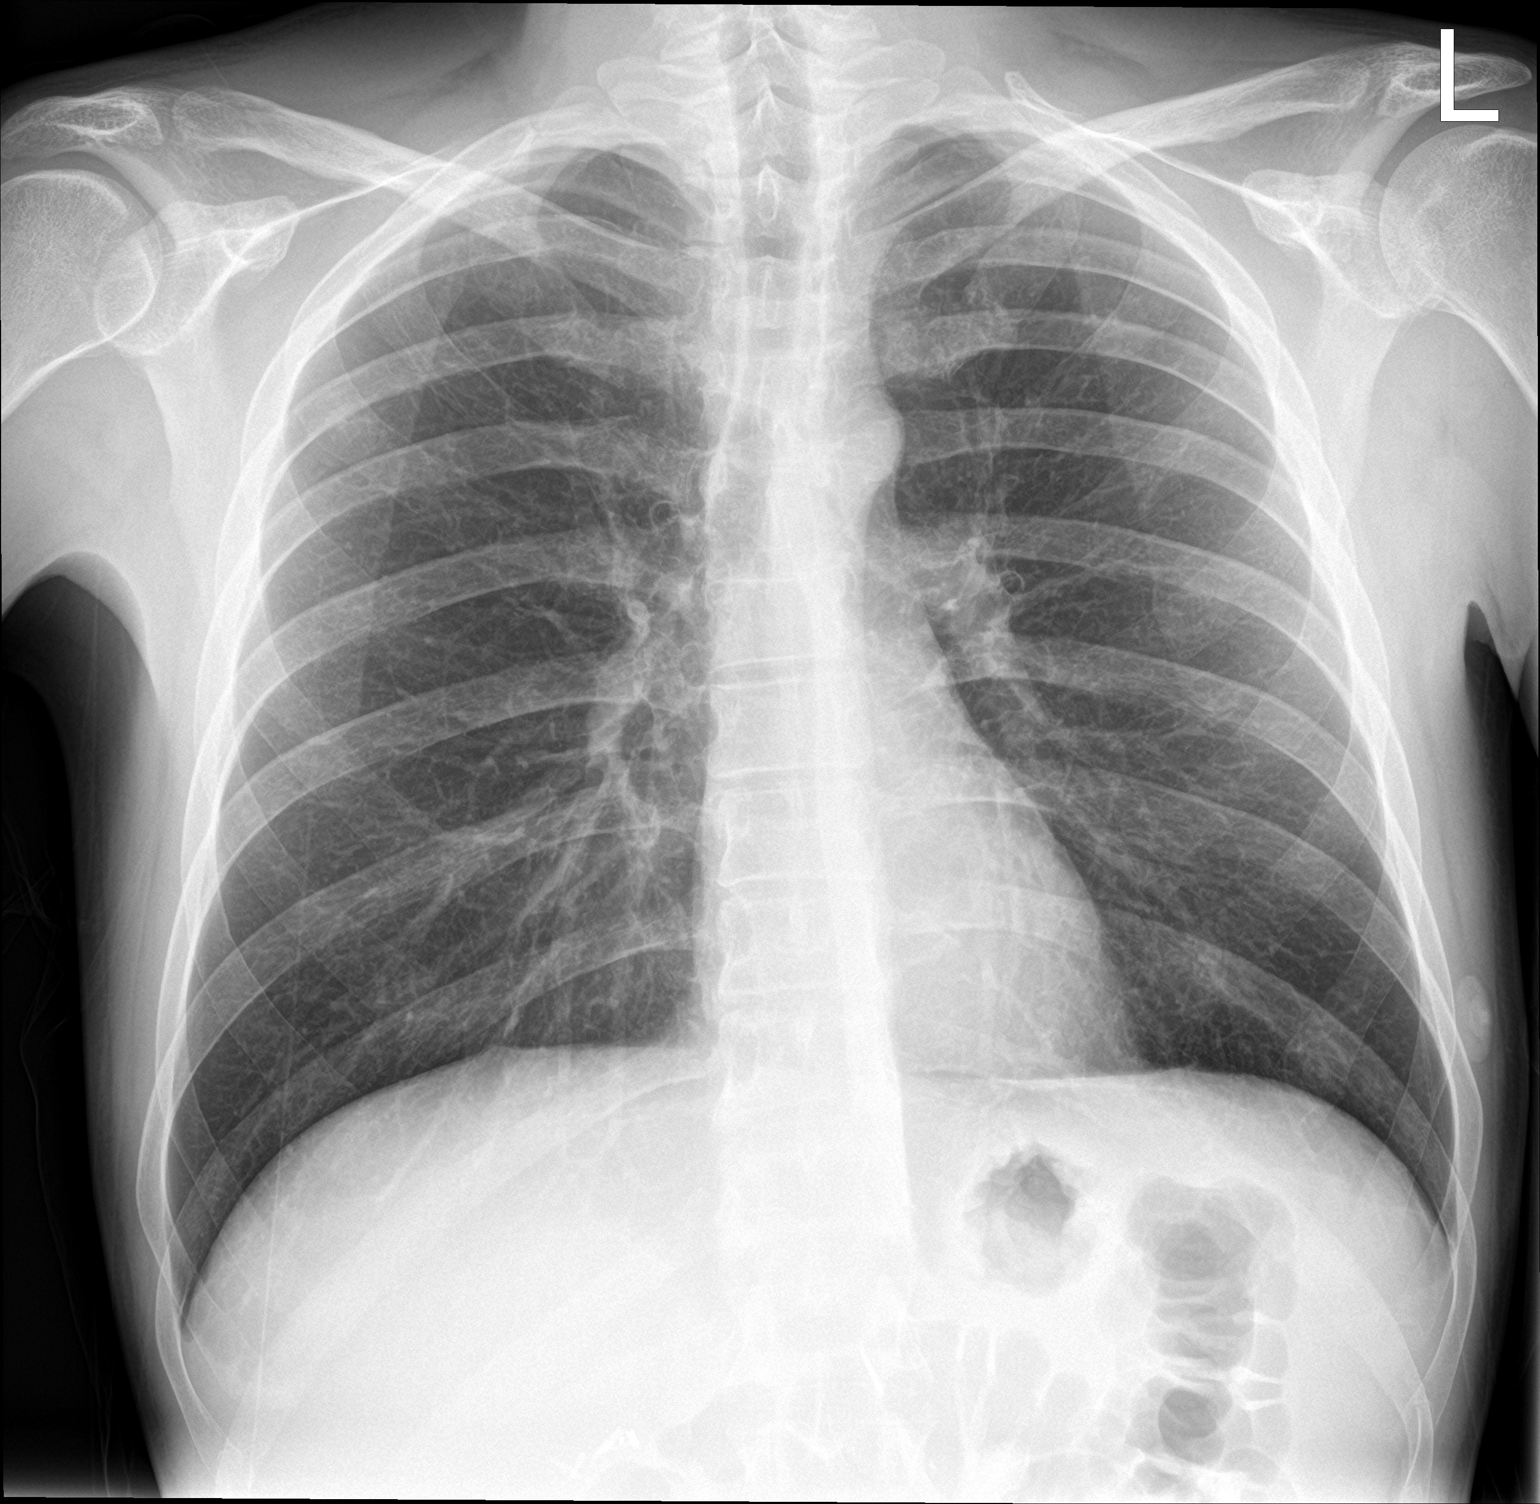

[chest lat]
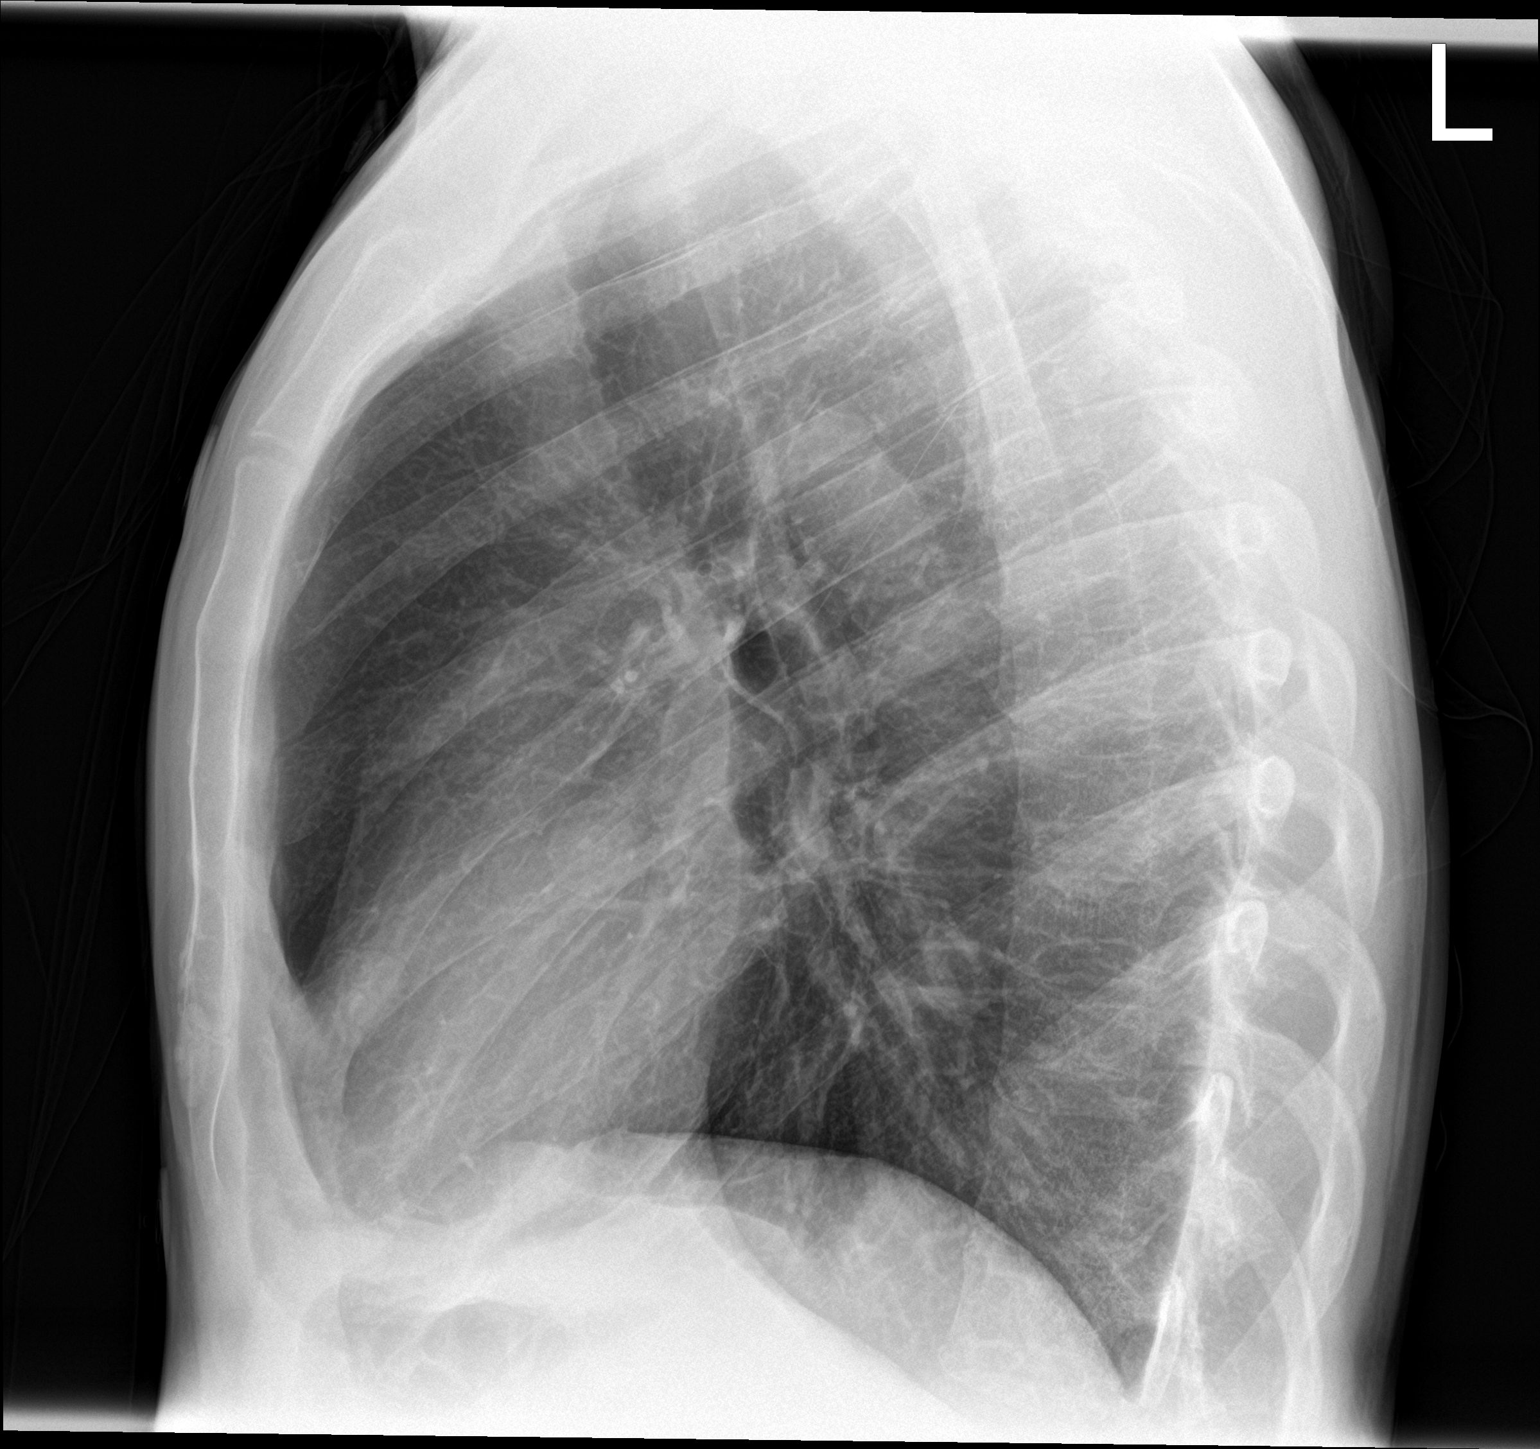

[2 of 2 positions shown; findings below may reference images not displayed]

FINDINGS: The heart size and mediastinal contours are within normal limits.
The lungs are clear. No pneumothorax or pleural effusion. The
visualized skeletal structures are unremarkable.
IMPRESSION: No acute cardiopulmonary finding.

## 2021-05-14 IMAGING — CT CT ABD-PELV W/ CM
2 of 4 series · 15 of 46 positions shown, 17 images · IV contrast (APPLIED)
Comparison: CT 06/30/2019, 06/12/2019, 06/03/2019

CLINICAL DATA: Abdominal pain possible pancreatitis

EXAM:
CT ABDOMEN AND PELVIS WITH CONTRAST
TECHNIQUE: Multidetector CT imaging of the abdomen and pelvis was performed
using the standard protocol following bolus administration of
intravenous contrast.
CONTRAST:  100mL OMNIPAQUE IOHEXOL 300 MG/ML  SOLN

[Series 3: abd/ pelvis 5.0 i30f 2 · axial · 0.72mm/px · z∈[+649,+1049]mm · 12 of 92 slices shown, 14 images]
[im 8/92  soft-tissue]
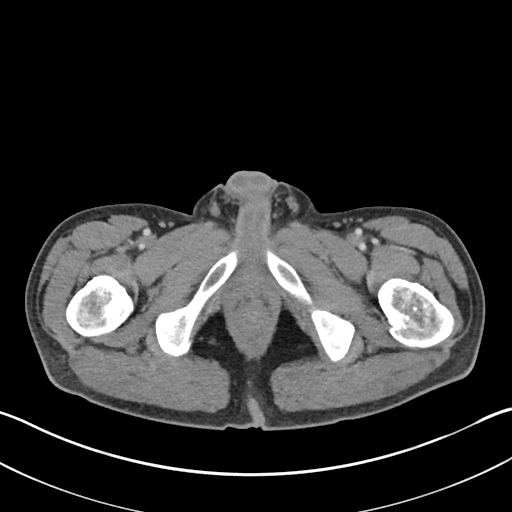
[im 8/92  bone]
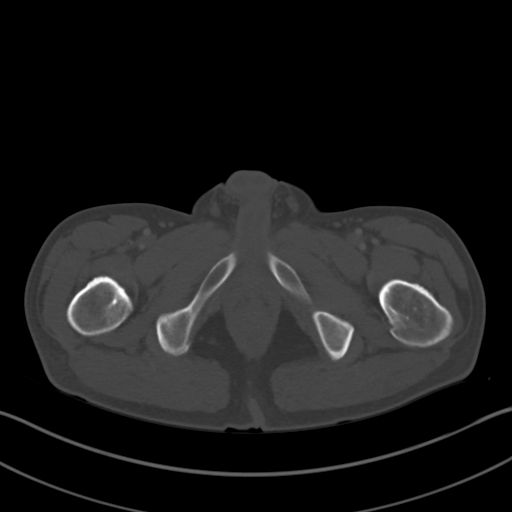
[im 15/92  soft-tissue]
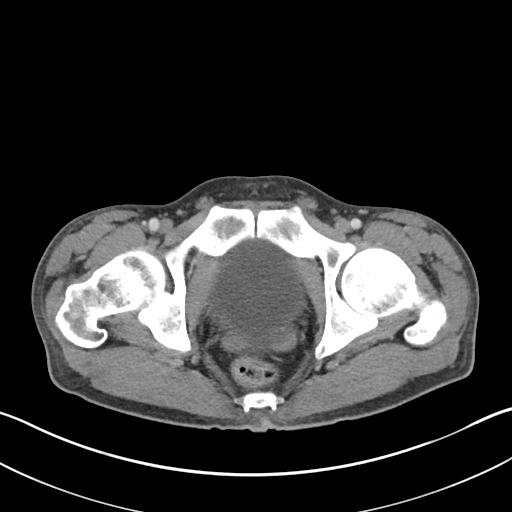
[im 22/92  soft-tissue]
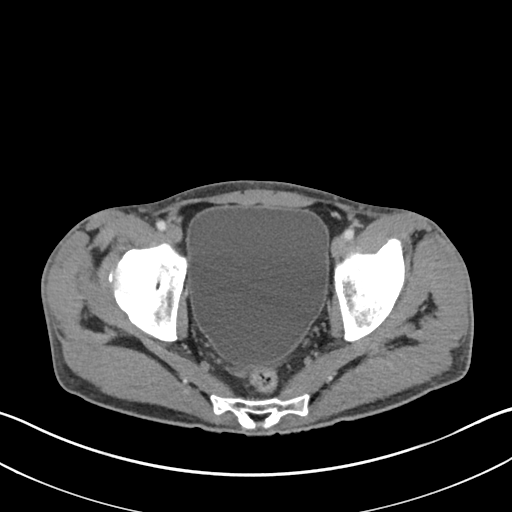
[im 30/92  soft-tissue]
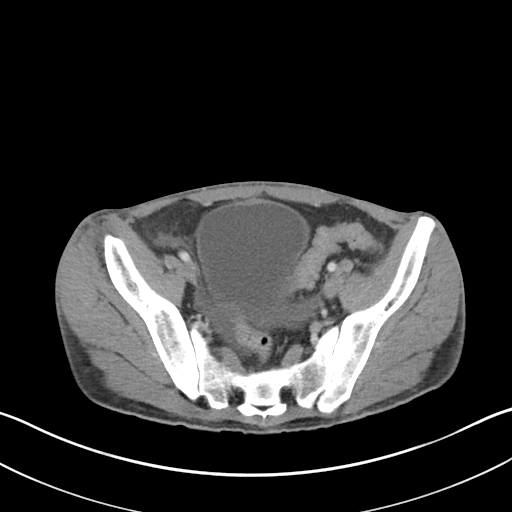
[im 37/92  soft-tissue]
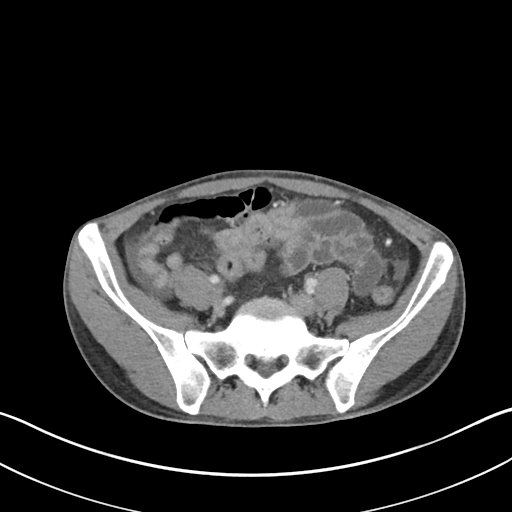
[im 44/92  soft-tissue]
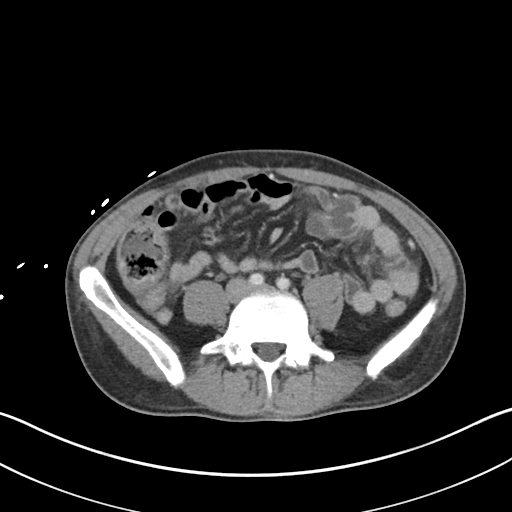
[im 51/92  soft-tissue]
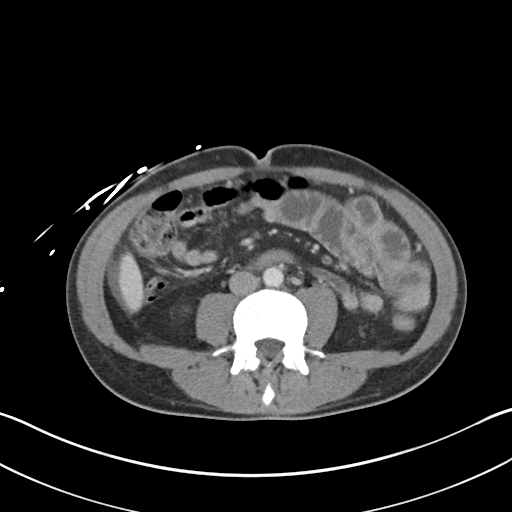
[im 59/92  soft-tissue]
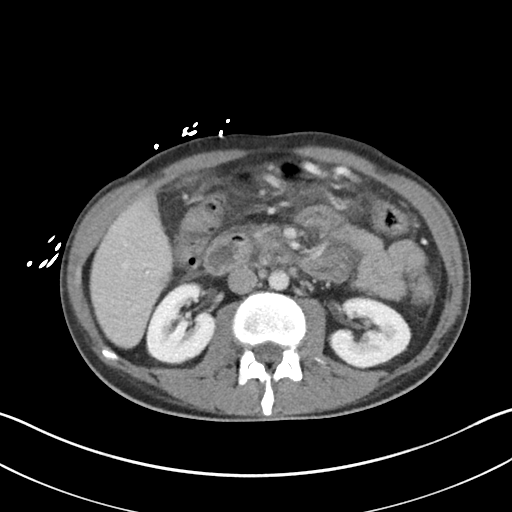
[im 66/92  soft-tissue]
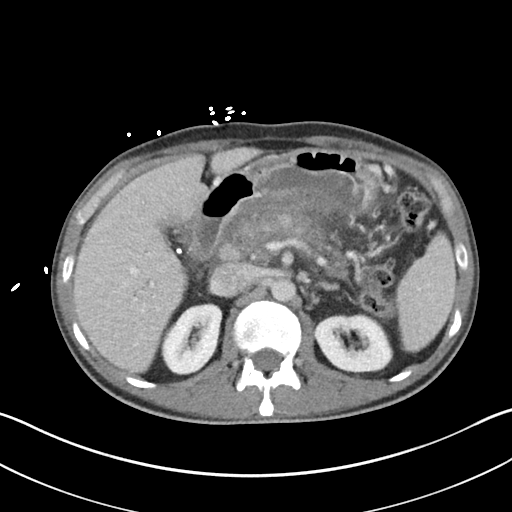
[im 66/92  bone]
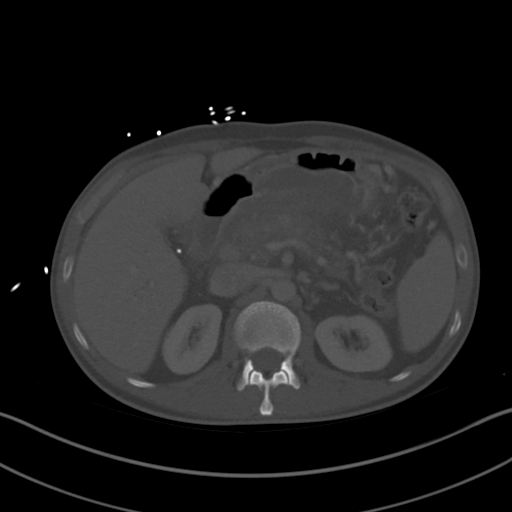
[im 73/92  soft-tissue]
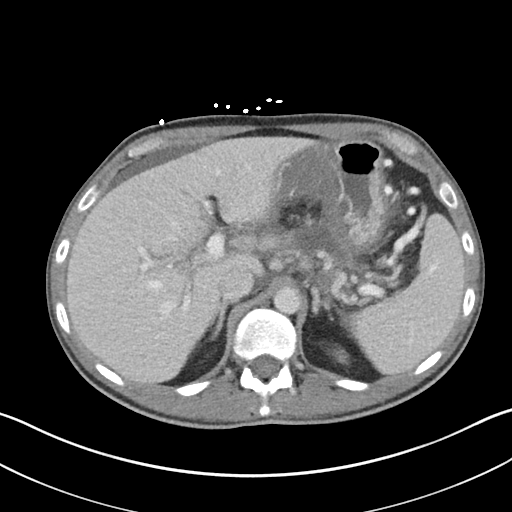
[im 81/92  soft-tissue]
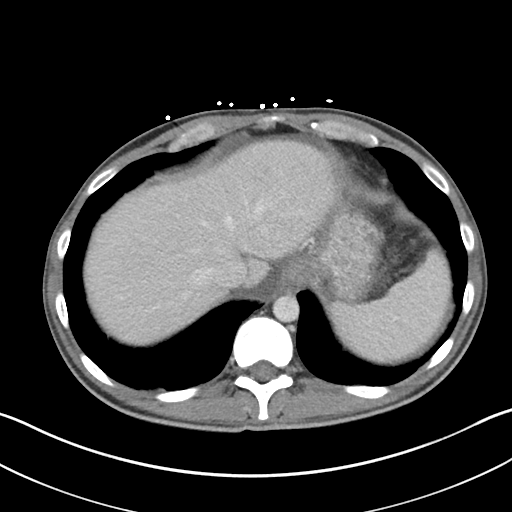
[im 88/92  soft-tissue]
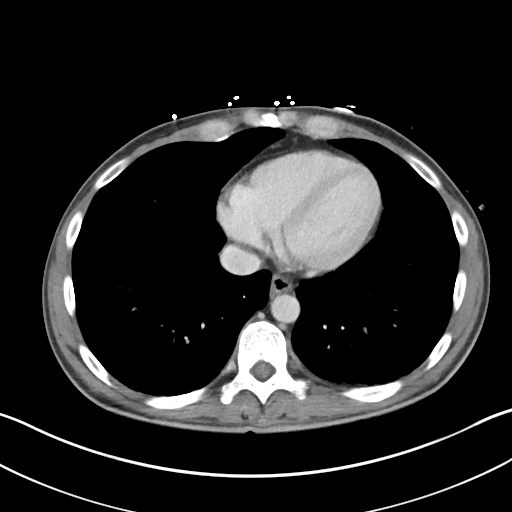

[Series 6: coronal soft tissue · coronal · 0.70mm/px · 3 of 83 slices shown]
[im 28/83  soft-tissue]
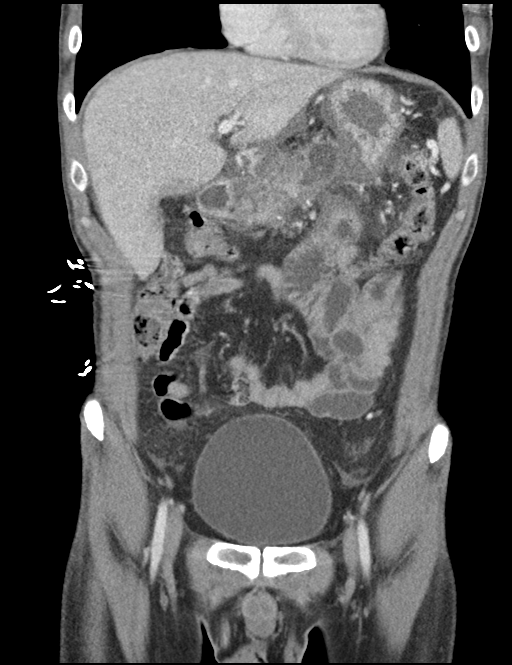
[im 37/83  soft-tissue]
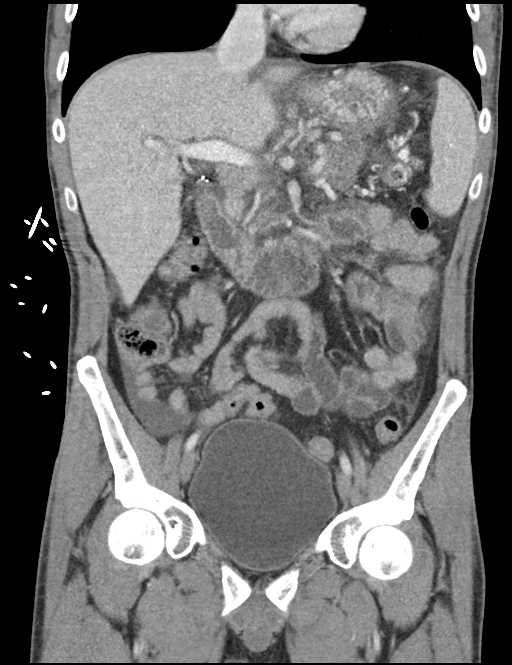
[im 46/83  soft-tissue]
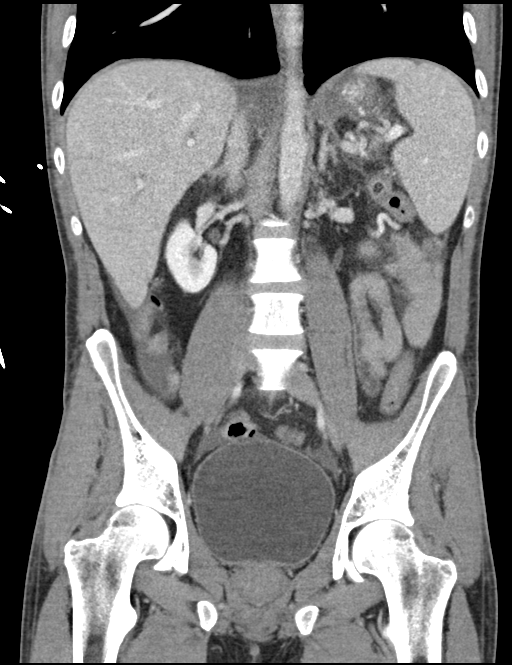

[15 of 46 positions shown; findings below may reference images not displayed]

FINDINGS: Lower chest: Lung bases demonstrate patchy dependent atelectasis. No
acute consolidation or effusion. Normal heart size.

Hepatobiliary: Status post cholecystectomy. No focal hepatic
abnormality. Subcentimeter hypodensity in the left lobe of the
liver, too small to further characterize. Probable focal fat
infiltration near the falciform ligament. No significant biliary
dilatation

Pancreas: Grossly abnormal. Continued significant peripancreatic
inflammatory change consistent with ongoing pancreatitis. Poorly
enhancing pancreatic parenchyma of the body and tail without
pancreatic gas. Focal fluid collection at the distal body and tail
of the pancreas measures 4.4 by 2.1 cm, previously 4.7 x 2.9 cm.
Interim development of organizing fluid collections between the
pancreas and spleen and at the posterior gastric wall measuring
cm.

Spleen: Normal in size without focal abnormality.

Adrenals/Urinary Tract: Adrenal glands are unremarkable. Kidneys are
normal, without renal calculi, focal lesion, or hydronephrosis.
Bladder is unremarkable.

Stomach/Bowel: Interval worsening of posterior gastric wall
thickening, now measuring up to 3.1 cm with diffuse low attenuation.
Edematous fluid at the gastric fundus and cardia. Duodenum is
slightly thickened. Fluid filled left lower quadrant small bowel. No
colon wall thickening. Negative appendix

Vascular/Lymphatic: Mild aortic atherosclerosis. No aneurysm.
Multiple small retroperitoneal nodes. Enlarged peripancreatic nodes
measuring up to 11 mm.

Reproductive: Prostate is unremarkable.

Other: No free air. New free fluid in the pelvis and abdomen, small
volume.

Musculoskeletal: No acute or significant osseous findings.
IMPRESSION: 1. Grossly abnormal appearance of the pancreas which is diffusely
enlarged and hypoattenuating. Persistent peripancreatic edema
consistent with pancreatitis. Slight decreased size of dominant
organized fluid collection at the distal body and tail, though with
interim development of multiple smaller organizing fluid collections
along the mid pancreatic body and neck. Interval worsening of
inflammatory change between the stomach and pancreas, now with
developing organizing fluid collection at the posterior gastric wall
measuring up to 2.4 cm. Posterior gastric wall thickening has
significantly worsened.
2. Increased free fluid within the abdomen and pelvis.
3. Mild fluid-filled slightly thickened loops of left upper quadrant
small bowel possibly due to reactive ileus and inflammation from
pancreatitis
